# Patient Record
Sex: Male | Born: 1960
Health system: Southern US, Community
[De-identification: ages and names within clinical notes are randomized; demographics above are authoritative.]

## PROBLEM LIST (undated history)

## (undated) DIAGNOSIS — R0600 Dyspnea, unspecified: Secondary | ICD-10-CM

## (undated) DIAGNOSIS — K219 Gastro-esophageal reflux disease without esophagitis: Secondary | ICD-10-CM

## (undated) DIAGNOSIS — B1920 Unspecified viral hepatitis C without hepatic coma: Secondary | ICD-10-CM

## (undated) DIAGNOSIS — G4733 Obstructive sleep apnea (adult) (pediatric): Secondary | ICD-10-CM

## (undated) DIAGNOSIS — G5603 Carpal tunnel syndrome, bilateral upper limbs: Secondary | ICD-10-CM

## (undated) DIAGNOSIS — I5189 Other ill-defined heart diseases: Secondary | ICD-10-CM

## (undated) DIAGNOSIS — F329 Major depressive disorder, single episode, unspecified: Secondary | ICD-10-CM

## (undated) DIAGNOSIS — E039 Hypothyroidism, unspecified: Secondary | ICD-10-CM

## (undated) DIAGNOSIS — G4730 Sleep apnea, unspecified: Secondary | ICD-10-CM

## (undated) DIAGNOSIS — G40909 Epilepsy, unspecified, not intractable, without status epilepticus: Secondary | ICD-10-CM

## (undated) DIAGNOSIS — R7611 Nonspecific reaction to tuberculin skin test without active tuberculosis: Secondary | ICD-10-CM

## (undated) DIAGNOSIS — H409 Unspecified glaucoma: Secondary | ICD-10-CM

## (undated) DIAGNOSIS — M797 Fibromyalgia: Secondary | ICD-10-CM

## (undated) DIAGNOSIS — Z87442 Personal history of urinary calculi: Secondary | ICD-10-CM

## (undated) DIAGNOSIS — E7800 Pure hypercholesterolemia, unspecified: Secondary | ICD-10-CM

## (undated) DIAGNOSIS — M1990 Unspecified osteoarthritis, unspecified site: Secondary | ICD-10-CM

## (undated) DIAGNOSIS — F419 Anxiety disorder, unspecified: Secondary | ICD-10-CM

## (undated) DIAGNOSIS — K5792 Diverticulitis of intestine, part unspecified, without perforation or abscess without bleeding: Secondary | ICD-10-CM

## (undated) DIAGNOSIS — F32A Depression, unspecified: Secondary | ICD-10-CM

## (undated) HISTORY — PX: LIVER BIOPSY: SHX301

## (undated) HISTORY — DX: Depression, unspecified: F32.A

## (undated) HISTORY — DX: Epilepsy, unspecified, not intractable, without status epilepticus: G40.909

## (undated) HISTORY — DX: Unspecified glaucoma: H40.9

## (undated) HISTORY — DX: Fibromyalgia: M79.7

## (undated) HISTORY — DX: Diverticulitis of intestine, part unspecified, without perforation or abscess without bleeding: K57.92

## (undated) HISTORY — DX: Other ill-defined heart diseases: I51.89

## (undated) HISTORY — DX: Sleep apnea, unspecified: G47.30

## (undated) HISTORY — DX: Anxiety disorder, unspecified: F41.9

## (undated) HISTORY — DX: Nonspecific reaction to tuberculin skin test without active tuberculosis: R76.11

## (undated) HISTORY — DX: Unspecified osteoarthritis, unspecified site: M19.90

## (undated) HISTORY — DX: Unspecified viral hepatitis C without hepatic coma: B19.20

## (undated) HISTORY — DX: Gastro-esophageal reflux disease without esophagitis: K21.9

---
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
## 2010-11-21 DIAGNOSIS — I459 Conduction disorder, unspecified: Secondary | ICD-10-CM | POA: Insufficient documentation

## 2010-11-21 DIAGNOSIS — F410 Panic disorder [episodic paroxysmal anxiety] without agoraphobia: Secondary | ICD-10-CM | POA: Insufficient documentation

## 2010-11-21 DIAGNOSIS — Z8669 Personal history of other diseases of the nervous system and sense organs: Secondary | ICD-10-CM | POA: Insufficient documentation

## 2012-07-30 DIAGNOSIS — M7730 Calcaneal spur, unspecified foot: Secondary | ICD-10-CM | POA: Insufficient documentation

## 2012-07-30 DIAGNOSIS — G8929 Other chronic pain: Secondary | ICD-10-CM | POA: Insufficient documentation

## 2014-11-09 DIAGNOSIS — R768 Other specified abnormal immunological findings in serum: Secondary | ICD-10-CM | POA: Insufficient documentation

## 2016-04-07 DIAGNOSIS — M170 Bilateral primary osteoarthritis of knee: Secondary | ICD-10-CM | POA: Insufficient documentation

## 2016-09-23 DIAGNOSIS — E559 Vitamin D deficiency, unspecified: Secondary | ICD-10-CM | POA: Insufficient documentation

## 2018-07-15 ENCOUNTER — Ambulatory Visit: Payer: Medicaid Other | Admitting: Internal Medicine

## 2018-07-15 ENCOUNTER — Telehealth: Payer: Self-pay

## 2018-07-15 ENCOUNTER — Other Ambulatory Visit: Payer: Self-pay

## 2018-07-15 ENCOUNTER — Encounter: Payer: Self-pay | Admitting: Internal Medicine

## 2018-07-15 VITALS — BP 134/78 | HR 80 | Resp 16 | Ht 73.0 in | Wt 389.0 lb

## 2018-07-15 DIAGNOSIS — G629 Polyneuropathy, unspecified: Secondary | ICD-10-CM

## 2018-07-15 DIAGNOSIS — G4733 Obstructive sleep apnea (adult) (pediatric): Secondary | ICD-10-CM

## 2018-07-15 DIAGNOSIS — F419 Anxiety disorder, unspecified: Secondary | ICD-10-CM

## 2018-07-15 NOTE — Progress Notes (Signed)
[facility] [address] [place], [place] [id number]  Pulmonary Sleep Medicine  Office Visit Note  Patient Name: [name] [name] DOB: 03-17-1960 MRN [id number]  Date of Service: 07/15/2018  Complaints/HPI: OSA Snoring Morbid obesity. He was diagnosed with OSA in 2010 and was in [place]. He states that he has been using his CPAP device but has had some mask issues and also is due for a new CPAP device. He notes that he is tired and sleepy. He does fall asleep watching TV  He does not drive. Has issues with snoring. He states that he has been notd to coke and gag. Patient also has gained weight over the course of the last years about 100 pounds. He states he does not have headaches noted. No cough fevers no exposure to covid.   ROS  General: (-) fever, (-) chills, (-) night sweats, (-) weakness Skin: (-) rashes, (-) itching,. Eyes: (-) visual changes, (-) redness, (-) itching. Nose and Sinuses: (-) nasal stuffiness or itchiness, (-) postnasal drip, (-) nosebleeds, (-) sinus trouble. Mouth and Throat: (-) sore throat, (-) hoarseness. Neck: (-) swollen glands, (-) enlarged thyroid, (-) neck pain. Respiratory: - cough, (-) bloody sputum, + shortness of breath, - wheezing. Cardiovascular: - ankle swelling, (-) chest pain. Lymphatic: (-) lymph node enlargement. Neurologic: (-) numbness, (-) tingling. Psychiatric: (-) anxiety, (-) depression   Current Medication: No outpatient encounter medications on file as of 07/15/2018.   No facility-administered encounter medications on file as of 07/15/2018.     Surgical History: Past Surgical History:  Procedure Laterality Date  . LIVER BIOPSY      Medical History: Past Medical History:  Diagnosis Date  . Arthritis   . Depression   . Glaucoma   . Hepatitis C   . Juvenile epilepsy (HCC)   . Positive TB test   . Sleep apnea     Family History: Family History  Problem Relation Age of Onset  . Aneurysm Mother   . Cancer Father      Social History: Social History   Socioeconomic History  . Marital status: Married    Spouse name: Not on file  . Number of children: Not on file  . Years of education: Not on file  . Highest education level: Not on file  Occupational History  . Not on file  Social Needs  . Financial resource strain: Not on file  . Food insecurity:    Worry: Not on file    Inability: Not on file  . Transportation needs:    Medical: Not on file    Non-medical: Not on file  Tobacco Use  . Smoking status: Never Smoker  . Smokeless tobacco: Never Used  Substance and Sexual Activity  . Alcohol use: Not Currently  . Drug use: Not Currently  . Sexual activity: Not on file  Lifestyle  . Physical activity:    Days per week: Not on file    Minutes per session: Not on file  . Stress: Not on file  Relationships  . Social connections:    Talks on phone: Not on file    Gets together: Not on file    Attends religious service: Not on file    Active member of club or organization: Not on file    Attends meetings of clubs or organizations: Not on file    Relationship status: Not on file  . Intimate partner violence:    Fear of current or ex partner: Not on file  Emotionally abused: Not on file    Physically abused: Not on file    Forced sexual activity: Not on file  Other Topics Concern  . Not on file  Social History Narrative  . Not on file    Vital Signs: Blood pressure 134/78, pulse 80, resp. rate 16, height 6\' 1"  (1.854 m), weight (!) 389 lb (176.4 kg), SpO2 97 %.  Examination: General Appearance: The patient is well-developed, well-nourished, and in no distress. Skin: Gross inspection of skin unremarkable. Head: normocephalic, no gross deformities. Eyes: no gross deformities noted. ENT: ears appear grossly normal no exudates. Neck: Supple. No thyromegaly. No LAD. Respiratory: No rhonchi noted at this time. Cardiovascular: Normal S1 and S2 without murmur or rub. Extremities: No  cyanosis. pulses are equal. Neurologic: Alert and oriented. No involuntary movements.  LABS: No results found for this or any previous visit (from the past 2160 hour(s)).  Radiology: Patient was never admitted.  No results found.  No results found.    Assessment and Plan: There are no active problems to display for this patient.   1. OSA needs a PSG will get this scheduled. Patient has not had a evaluation since 2010 at least. Also has issues with his CPAP device and so therfore will need to get the CPAP. 2. Morbid obesity discussed diet and weight loss 3. Neuropathy patient is on gabapentin.  4. Anxiety will need to be seen by his therapist   General Counseling: I have discussed the findings of the evaluation and examination with [name].  I have also discussed any further diagnostic evaluation thatmay be needed or ordered today. Azazel verbalizes understanding of the findings of todays visit. We also reviewed his medications today and discussed drug interactions and side effects including but not limited excessive drowsiness and altered mental states. We also discussed that there is always a risk not just to him but also people around him. he has been encouraged to call the office with any questions or concerns that should arise related to todays visit.    Time spent:  I have personally obtained a history, examined the patient, evaluated laboratory and imaging results, formulated the assessment and plan and placed orders.    Yevonne Pax, MD [facility] Pulmonary and Critical Care Sleep medicine

## 2018-07-15 NOTE — Patient Instructions (Signed)
Sleep Study    The sleep study consists of a recording of your brain waves (EEG). Breathing, heart rate and rhythm (ECG), oxygen level, eye movement, and leg movement.  The technician will glue or or paste several electrodes to your scalp, face, chest and legs.  You will have belts around your chest and abdomen to record breathing and a finger clasp to check blood oxygen levels.  A tube at your mouth and nose will detect airflow.  There are no needle sticks or painful procedures of any sort.  You will have your own room, and we will make every effort to attend to your comfort and privacy.  Please prepare for your study by the following steps:   Please avoid coffee, tea, soda, chocolate and other caffeine foods or beverages after 12:00 noon on the day of your sleep study.   You must arrive with clean (no oils), conditioners or make up, and please make sure that you wash your hair to ensure that your hair and scalp are clean, dry and free of any hair extensions on the day of your study.  This will help to get a good reading of study.  Please try not to nap on the day of your study.  Please bring a list of all your medications.  Bring any medications that you might need during the time you are within the laboratory, including insulin, sleeping pills, pain medication and anxiety medications.  Bring snacks, water or juice  Please bring clothes to sleep in and your normal overnight bag.  Please leave valuable at home, as we will not be responsible for any lost items.  If you have any further questions, please feel free to call our office. Thank you  **Please call our office 48 in advance to cancel or reschedule to avoid a $100.00 early cancel or no show fee**  

## 2018-07-15 NOTE — Telephone Encounter (Signed)
[facility] for new cpap supplies, he is scheduled for sleep study in lab. Beth

## 2018-07-19 ENCOUNTER — Encounter: Payer: Self-pay | Admitting: Adult Health

## 2018-07-27 ENCOUNTER — Ambulatory Visit: Payer: Medicaid Other | Admitting: Internal Medicine

## 2018-07-27 ENCOUNTER — Other Ambulatory Visit: Payer: Self-pay

## 2018-07-27 DIAGNOSIS — G4733 Obstructive sleep apnea (adult) (pediatric): Secondary | ICD-10-CM

## 2018-07-29 ENCOUNTER — Ambulatory Visit: Payer: Medicaid Other | Admitting: Adult Health

## 2018-07-29 ENCOUNTER — Encounter: Payer: Self-pay | Admitting: Adult Health

## 2018-07-29 ENCOUNTER — Other Ambulatory Visit: Payer: Self-pay

## 2018-07-29 VITALS — BP 144/82 | HR 85 | Resp 16 | Ht 73.0 in | Wt 389.0 lb

## 2018-07-29 DIAGNOSIS — F339 Major depressive disorder, recurrent, unspecified: Secondary | ICD-10-CM

## 2018-07-29 DIAGNOSIS — F4310 Post-traumatic stress disorder, unspecified: Secondary | ICD-10-CM

## 2018-07-29 DIAGNOSIS — F419 Anxiety disorder, unspecified: Secondary | ICD-10-CM

## 2018-07-29 DIAGNOSIS — K219 Gastro-esophageal reflux disease without esophagitis: Secondary | ICD-10-CM | POA: Diagnosis not present

## 2018-07-29 DIAGNOSIS — R030 Elevated blood-pressure reading, without diagnosis of hypertension: Secondary | ICD-10-CM

## 2018-07-29 MED ORDER — OMEPRAZOLE 20 MG PO CPDR: 20.0000 mg | DELAYED_RELEASE_CAPSULE | Freq: Every day | ORAL | 2 refills | Status: DC

## 2018-07-29 NOTE — Progress Notes (Signed)
[facility] [place], [place] [address]  Internal MEDICINE  Office Visit Note  Patient Name: [name] [name]  [id number]  [id number]  Date of Service: 07/29/2018   Complaints/HPI Pt is here for establishment of PCP. Chief Complaint  Patient presents with  . Medical Management of Chronic Issues    Establish pcp care , possible adhd, moved from [place]  . Anxiety  . Depression  . Fibromyalgia  . Gastroesophageal Reflux  . Quality Metric Gaps    colonoscopy    HPI Pt is here to establish PCP care.  He reports he moved down from [place] 3 months ago. He reportedly had been seeing psych weekly for med management. He has been out of his medications since late march.  He reports a history depression, anxiety, ptsd, and GERD. At this time he is doing well, and would like a refill on his prilosec for gerd, as well as a psych referral to establish care locally.       Current Medication: Outpatient Encounter Medications as of 07/29/2018  Medication Sig  . ALPRAZolam (XANAX) 1 MG tablet Take 1 mg by mouth 2 (two) times daily as needed for anxiety.  . busPIRone (BUSPAR) 15 MG tablet Take 15 mg by mouth 3 (three) times daily.  Marland Kitchen gabapentin (NEURONTIN) 600 MG tablet Take 600 mg by mouth 3 (three) times daily.  Marland Kitchen omeprazole (PRILOSEC) 20 MG capsule Take 1 capsule (20 mg total) by mouth daily.  Marland Kitchen PARoxetine (PAXIL) 40 MG tablet Take 40 mg by mouth every morning.  . traZODone (DESYREL) 100 MG tablet Take 100 mg by mouth at bedtime.  . [DISCONTINUED] cyclobenzaprine (FLEXERIL) 10 MG tablet Take 10 mg by mouth 3 (three) times daily as needed for muscle spasms.  . [DISCONTINUED] omeprazole (PRILOSEC) 20 MG capsule Take 20 mg by mouth daily.   No facility-administered encounter medications on file as of 07/29/2018.     Surgical History: Past Surgical History:  Procedure Laterality Date  . LIVER BIOPSY      Medical History: Past Medical History:  Diagnosis Date  . Anxiety    . Arthritis   . Depression   . Fibromyalgia   . GERD (gastroesophageal reflux disease)   . Glaucoma   . Hepatitis C   . Juvenile epilepsy ([place])   . Positive TB test   . Sleep apnea     Family History: Family History  Problem Relation Age of Onset  . Aneurysm Mother   . Cancer Father     Social History   Socioeconomic History  . Marital status: Married    Spouse name: Not on file  . Number of children: Not on file  . Years of education: Not on file  . Highest education level: Not on file  Occupational History  . Not on file  Social Needs  . Financial resource strain: Not on file  . Food insecurity    Worry: Not on file    Inability: Not on file  . Transportation needs    Medical: Not on file    Non-medical: Not on file  Tobacco Use  . Smoking status: Never Smoker  . Smokeless tobacco: Never Used  Substance and Sexual Activity  . Alcohol use: Not Currently  . Drug use: Not Currently  . Sexual activity: Not on file  Lifestyle  . Physical activity    Days per week: Not on file    Minutes per session: Not on file  . Stress: Not on file  Relationships  . Social Musicianconnections    Talks on phone: Not on file    Gets together: Not on file    Attends religious service: Not on file    Active member of club or organization: Not on file    Attends meetings of clubs or organizations: Not on file    Relationship status: Not on file  . Intimate partner violence    Fear of current or ex partner: Not on file    Emotionally abused: Not on file    Physically abused: Not on file    Forced sexual activity: Not on file  Other Topics Concern  . Not on file  Social History Narrative  . Not on file     Review of Systems  Constitutional: Negative.  Negative for chills, fatigue and unexpected weight change.  HENT: Negative.  Negative for congestion, rhinorrhea, sneezing and sore throat.   Eyes: Negative for redness.  Respiratory: Negative.  Negative for cough, chest  tightness and shortness of breath.   Cardiovascular: Negative.  Negative for chest pain and palpitations.  Gastrointestinal: Negative.  Negative for abdominal pain, constipation, diarrhea, nausea and vomiting.  Endocrine: Negative.   Genitourinary: Negative.  Negative for dysuria and frequency.  Musculoskeletal: Negative.  Negative for arthralgias, back pain, joint swelling and neck pain.  Skin: Negative.  Negative for rash.  Allergic/Immunologic: Negative.   Neurological: Negative.  Negative for tremors and numbness.  Hematological: Negative for adenopathy. Does not bruise/bleed easily.  Psychiatric/Behavioral: Negative.  Negative for behavioral problems, sleep disturbance and suicidal ideas. The patient is not nervous/anxious.     Vital Signs: BP (!) 144/82 (BP Location: Left Arm, Patient Position: Sitting, Cuff Size: Normal)   Pulse 85   Resp 16   Ht 6\' 1"  (1.854 m)   Wt (!) 389 lb (176.4 kg)   SpO2 97%   BMI 51.32 kg/m    Physical Exam Vitals signs and nursing note reviewed.  Constitutional:      General: He is not in acute distress.    Appearance: He is well-developed. He is not diaphoretic.  HENT:     Head: Normocephalic and atraumatic.     Mouth/Throat:     Pharynx: No oropharyngeal exudate.  Eyes:     Pupils: Pupils are equal, round, and reactive to light.  Neck:     Musculoskeletal: Normal range of motion and neck supple.     Thyroid: No thyromegaly.     Vascular: No JVD.     Trachea: No tracheal deviation.  Cardiovascular:     Rate and Rhythm: Normal rate and regular rhythm.     Heart sounds: Normal heart sounds. No murmur. No friction rub. No gallop.   Pulmonary:     Effort: Pulmonary effort is normal. No respiratory distress.     Breath sounds: Normal breath sounds. No wheezing or rales.  Chest:     Chest wall: No tenderness.  Abdominal:     Palpations: Abdomen is soft.     Tenderness: There is no abdominal tenderness. There is no guarding.   Musculoskeletal: Normal range of motion.  Lymphadenopathy:     Cervical: No cervical adenopathy.  Skin:    General: Skin is warm and dry.  Neurological:     Mental Status: He is alert and oriented to person, place, and time.     Cranial Nerves: No cranial nerve deficit.  Psychiatric:        Behavior: Behavior normal.  Thought Content: Thought content normal.        Judgment: Judgment normal.     Assessment/Plan: 1. PTSD (post-traumatic stress disorder) See psychiatry when appt is established.  - Ambulatory referral to Psychiatry  2. Anxiety Stable at this time.. See psychiatry as discussed for med management.  - Ambulatory referral to Psychiatry  3. Depression, recurrent (HCC) Currently stable, see psychiatry - Ambulatory referral to Psychiatry  4. Gastroesophageal reflux disease without esophagitis Refilled patients Prilosec NAD noted at this time.  - omeprazole (PRILOSEC) 20 MG capsule; Take 1 capsule (20 mg total) by mouth daily.  Dispense: 30 capsule; Refill: 2  5. Elevated BP without diagnosis of hypertension Bp slightly elevated, rechecked and found to be142/72 Will continue to monitor at future visits.   General Counseling: Lidia CollumAllen verbalizes understanding of the findings of todays visit and agrees with plan of treatment. I have discussed any further diagnostic evaluation that may be needed or ordered today. We also reviewed his medications today. he has been encouraged to call the office with any questions or concerns that should arise related to todays visit.  Orders Placed This Encounter  Procedures  . Ambulatory referral to Psychiatry    Meds ordered this encounter  Medications  . omeprazole (PRILOSEC) 20 MG capsule    Sig: Take 1 capsule (20 mg total) by mouth daily.    Dispense:  30 capsule    Refill:  2    Time spent 35 Minutes   This patient was seen by Blima LedgerAdam Couper Juncaj AGNP-C in Collaboration with Dr Lyndon CodeFozia M Khan as a part of collaborative care  agreement  Johnna AcostaAdam J. Dragan Tamburrino AGNP-C Internal Medicine

## 2018-08-04 ENCOUNTER — Other Ambulatory Visit: Payer: Self-pay | Admitting: Adult Health

## 2018-08-05 LAB — CBC WITH DIFFERENTIAL/PLATELET
Basophils Absolute: 0 x10E3/uL (ref 0.0–0.2)
Basos: 1 %
EOS (ABSOLUTE): 0.1 x10E3/uL (ref 0.0–0.4)
Eos: 1 %
Hematocrit: 40.6 % (ref 37.5–51.0)
Hemoglobin: 13.6 g/dL (ref 13.0–17.7)
Immature Grans (Abs): 0 x10E3/uL (ref 0.0–0.1)
Immature Granulocytes: 1 %
Lymphocytes Absolute: 1.9 x10E3/uL (ref 0.7–3.1)
Lymphs: 22 %
MCH: 29.2 pg (ref 26.6–33.0)
MCHC: 33.5 g/dL (ref 31.5–35.7)
MCV: 87 fL (ref 79–97)
Monocytes Absolute: 0.7 x10E3/uL (ref 0.1–0.9)
Monocytes: 8 %
Neutrophils Absolute: 5.8 x10E3/uL (ref 1.4–7.0)
Neutrophils: 67 %
Platelets: 209 x10E3/uL (ref 150–450)
RBC: 4.66 x10E6/uL (ref 4.14–5.80)
RDW: 12.8 % (ref 11.6–15.4)
WBC: 8.6 x10E3/uL (ref 3.4–10.8)

## 2018-08-05 LAB — T4, FREE: Free T4: 0.71 ng/dL — ABNORMAL LOW (ref 0.82–1.77)

## 2018-08-05 LAB — HGB A1C W/O EAG: Hgb A1c MFr Bld: 5.7 % — ABNORMAL HIGH (ref 4.8–5.6)

## 2018-08-05 LAB — COMPREHENSIVE METABOLIC PANEL WITH GFR
ALT: 26 IU/L (ref 0–44)
AST: 22 IU/L (ref 0–40)
Albumin/Globulin Ratio: 1.3 (ref 1.2–2.2)
Albumin: 4.3 g/dL (ref 3.8–4.9)
Alkaline Phosphatase: 72 IU/L (ref 39–117)
BUN/Creatinine Ratio: 23 — ABNORMAL HIGH (ref 9–20)
BUN: 18 mg/dL (ref 6–24)
Bilirubin Total: 0.4 mg/dL (ref 0.0–1.2)
CO2: 26 mmol/L (ref 20–29)
Calcium: 9.6 mg/dL (ref 8.7–10.2)
Chloride: 102 mmol/L (ref 96–106)
Creatinine, Ser: 0.8 mg/dL (ref 0.76–1.27)
GFR calc Af Amer: 116 mL/min/1.73
GFR calc non Af Amer: 101 mL/min/1.73
Globulin, Total: 3.2 g/dL (ref 1.5–4.5)
Glucose: 88 mg/dL (ref 65–99)
Potassium: 4.6 mmol/L (ref 3.5–5.2)
Sodium: 140 mmol/L (ref 134–144)
Total Protein: 7.5 g/dL (ref 6.0–8.5)

## 2018-08-05 LAB — LIPID PANEL WITH LDL/HDL RATIO
Cholesterol, Total: 168 mg/dL (ref 100–199)
HDL: 40 mg/dL
LDL Calculated: 101 mg/dL — ABNORMAL HIGH (ref 0–99)
LDl/HDL Ratio: 2.5 ratio (ref 0.0–3.6)
Triglycerides: 137 mg/dL (ref 0–149)
VLDL Cholesterol Cal: 27 mg/dL (ref 5–40)

## 2018-08-05 LAB — TSH: TSH: 1.15 u[IU]/mL (ref 0.450–4.500)

## 2018-08-05 LAB — PSA: Prostate Specific Ag, Serum: 0.6 ng/mL (ref 0.0–4.0)

## 2018-08-09 ENCOUNTER — Other Ambulatory Visit: Payer: Self-pay

## 2018-08-09 ENCOUNTER — Ambulatory Visit: Payer: Medicaid Other | Admitting: Internal Medicine

## 2018-08-09 ENCOUNTER — Encounter: Payer: Self-pay | Admitting: Internal Medicine

## 2018-08-09 VITALS — BP 144/84 | HR 82 | Resp 16 | Ht 73.0 in | Wt 385.0 lb

## 2018-08-09 DIAGNOSIS — F4310 Post-traumatic stress disorder, unspecified: Secondary | ICD-10-CM | POA: Diagnosis not present

## 2018-08-09 DIAGNOSIS — G4733 Obstructive sleep apnea (adult) (pediatric): Secondary | ICD-10-CM | POA: Diagnosis not present

## 2018-08-09 DIAGNOSIS — F419 Anxiety disorder, unspecified: Secondary | ICD-10-CM

## 2018-08-09 NOTE — Patient Instructions (Signed)

## 2018-08-09 NOTE — Progress Notes (Signed)
[facility] [place], [place] [address]  Pulmonary Sleep Medicine   Office Visit Note  Patient Name: [name] [name] DOB: 1December 23, 1962 MRN [id number]  Date of Service: 08/09/2018  Complaints/HPI: Pt is here for follow up on sleep study.  His study showed an overall AHI 22.9/hr. Which is consistent with moderate sleep apnea. He had a low oxygen recording of 78%, showing a significant desaturation.  His HR noted no arrhythmias, and his HR was between 65 and 90 bpm.      ROS  General: (-) fever, (-) chills, (-) night sweats, (-) weakness Skin: (-) rashes, (-) itching,. Eyes: (-) visual changes, (-) redness, (-) itching. Nose and Sinuses: (-) nasal stuffiness or itchiness, (-) postnasal drip, (-) nosebleeds, (-) sinus trouble. Mouth and Throat: (-) sore throat, (-) hoarseness. Neck: (-) swollen glands, (-) enlarged thyroid, (-) neck pain. Respiratory: - cough, (-) bloody sputum, - shortness of breath, - wheezing. Cardiovascular: - ankle swelling, (-) chest pain. Lymphatic: (-) lymph node enlargement. Neurologic: (-) numbness, (-) tingling. Psychiatric: (-) anxiety, (-) depression   Current Medication: Outpatient Encounter Medications as of 08/09/2018  Medication Sig  . ALPRAZolam (XANAX) 1 MG tablet Take 1 mg by mouth 2 (two) times daily as needed for anxiety.  . busPIRone (BUSPAR) 15 MG tablet Take 15 mg by mouth 3 (three) times daily.  Marland Kitchen gabapentin (NEURONTIN) 600 MG tablet Take 600 mg by mouth 3 (three) times daily.  . NON FORMULARY cpap device  . omeprazole (PRILOSEC) 20 MG capsule Take 1 capsule (20 mg total) by mouth daily.  Marland Kitchen PARoxetine (PAXIL) 40 MG tablet Take 40 mg by mouth every morning.  . traZODone (DESYREL) 100 MG tablet Take 100 mg by mouth at bedtime.   No facility-administered encounter medications on file as of 08/09/2018.     Surgical History: Past Surgical History:  Procedure Laterality Date  . LIVER BIOPSY      Medical History: Past  Medical History:  Diagnosis Date  . Anxiety   . Arthritis   . Depression   . Fibromyalgia   . GERD (gastroesophageal reflux disease)   . Glaucoma   . Hepatitis C   . Juvenile epilepsy ([place])   . Positive TB test   . Sleep apnea     Family History: Family History  Problem Relation Age of Onset  . Aneurysm Mother   . Cancer Father     Social History: Social History   Socioeconomic History  . Marital status: Married    Spouse name: Not on file  . Number of children: Not on file  . Years of education: Not on file  . Highest education level: Not on file  Occupational History  . Not on file  Social Needs  . Financial resource strain: Not on file  . Food insecurity    Worry: Not on file    Inability: Not on file  . Transportation needs    Medical: Not on file    Non-medical: Not on file  Tobacco Use  . Smoking status: Never Smoker  . Smokeless tobacco: Never Used  Substance and Sexual Activity  . Alcohol use: Not Currently  . Drug use: Not Currently  . Sexual activity: Not on file  Lifestyle  . Physical activity    Days per week: Not on file    Minutes per session: Not on file  . Stress: Not on file  Relationships  . Social connections    Talks on phone: Not on file  Gets together: Not on file    Attends religious service: Not on file    Active member of club or organization: Not on file    Attends meetings of clubs or organizations: Not on file    Relationship status: Not on file  . Intimate partner violence    Fear of current or ex partner: Not on file    Emotionally abused: Not on file    Physically abused: Not on file    Forced sexual activity: Not on file  Other Topics Concern  . Not on file  Social History Narrative  . Not on file    Vital Signs: Blood pressure (!) 144/84, pulse 82, resp. rate 16, height 6\' 1"  (1.854 m), weight (!) 385 lb (174.6 kg), SpO2 94 %.  Examination: General Appearance: The patient is well-developed, well-nourished,  and in no distress. Skin: Gross inspection of skin unremarkable. Head: normocephalic, no gross deformities. Eyes: no gross deformities noted. ENT: ears appear grossly normal no exudates. Neck: Supple. No thyromegaly. No LAD. Respiratory: clear bilaterally. Cardiovascular: Normal S1 and S2 without murmur or rub. Extremities: No cyanosis. pulses are equal. Neurologic: Alert and oriented. No involuntary movements.  LABS: No results found for this or any previous visit (from the past 2160 hour(s)).  Radiology: Patient was never admitted.  No results found.  No results found.    Assessment and Plan: There are no active problems to display for this patient.   1. OSA (obstructive sleep apnea) Will get titration study to evaluate for appropriate pressure to control his osa.  - Cpap titration; Future  2. PTSD (post-traumatic stress disorder) Stable, continue present management.   3. Anxiety Stable, continue present management.   General Counseling: I have discussed the findings of the evaluation and examination with Freida BusmanAllen.  I have also discussed any further diagnostic evaluation thatmay be needed or ordered today. Ladamien verbalizes understanding of the findings of todays visit. We also reviewed his medications today and discussed drug interactions and side effects including but not limited excessive drowsiness and altered mental states. We also discussed that there is always a risk not just to him but also people around him. he has been encouraged to call the office with any questions or concerns that should arise related to todays visit.    Time spent: 20 This patient was seen by Blima LedgerAdam Rogelio Winbush AGNP-C in Collaboration with Dr. Freda MunroSaadat Khan as a part of collaborative care agreement.   I have personally obtained a history, examined the patient, evaluated laboratory and imaging results, formulated the assessment and plan and placed orders.    Yevonne PaxSaadat A Khan, MD [facility] Pulmonary and  Critical Care Sleep medicine

## 2018-08-15 NOTE — Procedures (Signed)
[facility] [address] [place],  [address]  Patient Name: [name] [name] DOB: [birth year]   SLEEP STUDY INTERPRETATION  DATE OF SERVICE: July 27, 2018   SLEEP STUDY HISTORY: This patient is referred to the sleep lab for a baseline Polysomnography. Pertinent history includes a history of diagnosis of excessive daytime somnolence and snoring.  PROCEDURE: This overnight polysomnogram was performed using the Alice 5 acquisition system using the standard diagnostic protocol as outlined by the AASM. This includes 6 channels of EEG, 2 channelscannels of EOG, chin EMG, bilateral anterior tibialis EMG, nasal/oral thermister, PTAF, chest and abdominal wall movements, ECG and pulse oximetry. Apneas and Hypopneas were scored per AASM definition.  SLEEP ARCHITECHTURE: This is a baseline polysomnograph  study. The total recording time was 396.8 minutes and the patients total sleep time is noted to be 209.8 minutes. Sleep onset latency was 54.0 minutes and is prolonged.  Stage R sleep onset latency was 253.0 minutes. Sleep maintenance efficiency was 52.9 % and is reduced.  Sleep staging expressed as a percentage of total sleep time demonstrated 27.4 % N1, 38.5 % N2 and 18.6 % N3  sleep. Stage R represents 15.5 % of total sleep time. This is decreased.  There were a total of 182 arousals  for an overall arousal index of 52.0 per hour of sleep. PLMS arousal are not noted. Arousals without respiratory events are  noted. This can contribute to sleep architechture disruption.  RESPIRATORY MONITORING:   Patient exhibits significant evidence of sleep disorderd breathing characterized by 12 central apneas, 42 obstructive apneas and 4 mixed apneas. There were 22 obstructive hypopneas and 0 RERAs. Most of the apneas/hypopneas were of obstructive variety. The total apnea hypopnea index (apneas and hypopneas per hour of sleep) is 22.9 respiratory events per hour and is moderate.  Respiratory  monitoring demonstrated mild snoring through the night. There are a total of 141 snoring episodes representing 7.2 % of sleep.   Baseline oxygen saturation during wakefulness was 94 % and during NREM sleep averaged 92 % through the night. Arterial saturation during REM sleep was 91 % through the night. There was significant  oxygen desaturation with the respiratory events. Arterial oxygen desaturation occurred of at least 4% was noted with a low saturation of 78 %. The study was performed off oxygen.  CARDIAC MONITORING:   Average heart rate is 65 during sleep with a high of 90 beats per minute. Malignant arrhythmias were not noted.    IMPRESSIONS:  --This overnight polysomnogram demonstrates presence of moderate obstructive sleep apnea with an overall AHI 22.9 per hour. --The overall AHI was significantly worse  during Stage R. --There were associated significant arterial oxygen desaturations noted down to 74% --There was no significant PLMS noted in this study. --There is mild snoring noted throughout the study.    RECOMMENDATIONS:  --CPAP titration study is indicated in this case to determine optimal response to therapy. --Nasal decongestants and antihistamines may be of help for increased upper airways resistance when present. --Weight loss through dietary and lifestyle modification is recommended in the presence of obesity. --A search for and treatment of any underlying cardiopulmonary disease is      recommended in the presence of oxygen desaturations. --Alternative treatment options if the patient is not willing to use CPAP include oral   appliances as well as surgical intervention which may help in the appropriate patient. --Clinical correlation is recommended. Please feel free to call the office for any further  questions or  assistance in the care of this patient.     Allyne Gee, MD [facility] Pulmonary Critical Care Medicine Sleep medicine

## 2018-08-18 ENCOUNTER — Encounter (INDEPENDENT_AMBULATORY_CARE_PROVIDER_SITE_OTHER): Payer: Medicaid Other | Admitting: Internal Medicine

## 2018-08-18 DIAGNOSIS — G4733 Obstructive sleep apnea (adult) (pediatric): Secondary | ICD-10-CM | POA: Diagnosis not present

## 2018-08-23 ENCOUNTER — Other Ambulatory Visit: Payer: Self-pay

## 2018-08-31 ENCOUNTER — Other Ambulatory Visit: Payer: Self-pay

## 2018-08-31 ENCOUNTER — Encounter: Payer: Medicaid Other | Admitting: Adult Health

## 2018-09-05 NOTE — Procedures (Signed)
[facility] [address] [place], [place] [address]  Patient Name: [name] [name] DOB: 1Apr 23, 1962   SLEEP STUDY INTERPRETATION  DATE OF SERVICE: August 18, 2018   SLEEP STUDY HISTORY: This patient is referred to the sleep lab for a baseline Polysomnography. Pertinent history includes a history of diagnosis of excessive daytime somnolence and snoring.  PROCEDURE: This overnight polysomnogram was performed using the Alice 5 acquisition system using the standard diagnostic protocol as outlined by the AASM. This includes 6 channels of EEG, 2 channelscannels of EOG, chin EMG, bilateral anterior tibialis EMG, nasal/oral thermister, PTAF, chest and abdominal wall movements, ECG and pulse oximetry. Apneas and Hypopneas were scored per AASM definition.  SLEEP ARCHITECHTURE: This is a baseline polysomnograph  study. The total recording time was 358 minutes and the patients total sleep time is noted to be 179.5 minutes. Sleep onset latency was 40.0 minutes and is prolonged.  Stage R sleep onset latency was 145.0 minutes. Sleep maintenance efficiency was 50.5 % and is decreased.  Sleep staging expressed as a percentage of total sleep time demonstrated 17.5 % N1, 35.4 % N2 and 27.9 % N3  sleep. Stage R represents 19.2 % of total sleep time. This is decreased.  There were a total of 39 arousals  for an overall arousal index of 13.0 per hour of sleep. PLMS arousal were not noted. Arousals without respiratory events are  noted. This can contribute to sleep architechture disruption.  RESPIRATORY MONITORING:   Patient exhibits some evidence of sleep disorderd breathing characterized by 1 central apneas, 0 obstructive apneas and 1 mixed apneas. There were 42 obstructive hypopneas and 0 RERAs. Most of the apneas/hypopneas were of obstructive variety. The total apnea hypopnea index (apneas and hypopneas per hour of sleep) is 14.7 respiratory events per hour and is mild.  Respiratory monitoring  demonstrated mild snoring through the night. There are a total of 44 snoring episodes representing 2.5 % of sleep.   Baseline oxygen saturation during wakefulness was 94 % and during NREM sleep averaged 92 % through the night. Arterial saturation during REM sleep was 92 % through the night. There was significant  oxygen desaturation with the respiratory events. Arterial oxygen desaturation occurred of at least 4% was noted with a low saturation of 84 %. The study was performed off oxygen.  CARDIAC MONITORING:   Average heart rate is 74 during sleep with a high of 94 beats per minute. Malignant arrhythmias were not noted.  CPAP titration: Patient was started on CPAP titration per sleep lab protocol.  Patient was started on a pressure of 6 cm water pressure and increased up to a pressure of 10 cm water pressure.  On a pressure of 10 cm water pressure for a total sleep time of 83.5 minutes stage R was noted.  Patient did exhibit an AHI of 7.2 and lowest saturations were 89%.  No further titration was done beyond this level and could have been continued to determine the optimal pressure.  IMPRESSIONS:  --This overnight polysomnogram demonstrates presence of mild sleep apnea with an overall AHI 14.7 per hour. --The overall AHI was no worse  during Stage R. --There were associated some arterial oxygen desaturations noted 84% --There was no significant PLMS noted in this study. --There is was mild snoring noted throughout the study.    RECOMMENDATIONS:  --CPAP titration study is incomplete in this case as the patient was titrated up to a pressure of 10 however still had some residual respiratory disturbances noted.  It would be beneficial to do an auto titration on this patient and this will help to determine the optimal pressure alternatively patient may be brought back to the sleep lab for a full titration beyond a pressure of 10 cm water pressure.. --Nasal decongestants and antihistamines may be  of help for increased upper airways resistance when present. --Weight loss through dietary and lifestyle modification is recommended in the presence of obesity. --A search for and treatment of any underlying cardiopulmonary disease is      recommended in the presence of oxygen desaturations. --Alternative treatment options if the patient is not willing to use CPAP include oral   appliances as well as surgical intervention which may help in the appropriate patient. --Clinical correlation is recommended. Please feel free to call the office for any further  questions or assistance in the care of this patient.     Yevonne PaxSaadat A , MD [facility] Pulmonary Critical Care Medicine Sleep medicine

## 2018-09-07 ENCOUNTER — Encounter: Payer: Self-pay | Admitting: Internal Medicine

## 2018-09-07 ENCOUNTER — Ambulatory Visit: Payer: Medicaid Other | Admitting: Internal Medicine

## 2018-09-07 ENCOUNTER — Other Ambulatory Visit: Payer: Self-pay

## 2018-09-07 VITALS — BP 134/70 | HR 80 | Resp 16 | Ht 73.0 in | Wt 392.0 lb

## 2018-09-07 DIAGNOSIS — G4733 Obstructive sleep apnea (adult) (pediatric): Secondary | ICD-10-CM

## 2018-09-07 DIAGNOSIS — E034 Atrophy of thyroid (acquired): Secondary | ICD-10-CM | POA: Diagnosis not present

## 2018-09-07 MED ORDER — LEVOTHYROXINE SODIUM 50 MCG PO TABS: 50.0000 ug | ORAL_TABLET | Freq: Every day | ORAL | 2 refills | Status: DC

## 2018-09-07 NOTE — Progress Notes (Signed)
[facility] [address] [place], [place] [id number]  Pulmonary Sleep Medicine   Office Visit Note  Patient Name: [name] [name] DOB: 10/25/1960 MRN [id number]  Date of Service: 09/07/2018  Complaints/HPI: Pt is here for follow up on titration study.  His titration study shows a  pressure of 10 was not exactly sufficient. We discussed coming in for another titration study to check optimal pressure or do an autotitation machine.  He felt that was best at this time.   ROS  General: (-) fever, (-) chills, (-) night sweats, (-) weakness Skin: (-) rashes, (-) itching,. Eyes: (-) visual changes, (-) redness, (-) itching. Nose and Sinuses: (-) nasal stuffiness or itchiness, (-) postnasal drip, (-) nosebleeds, (-) sinus trouble. Mouth and Throat: (-) sore throat, (-) hoarseness. Neck: (-) swollen glands, (-) enlarged thyroid, (-) neck pain. Respiratory: - cough, (-) bloody sputum, - shortness of breath, - wheezing. Cardiovascular: - ankle swelling, (-) chest pain. Lymphatic: (-) lymph node enlargement. Neurologic: (-) numbness, (-) tingling. Psychiatric: (-) anxiety, (-) depression   Current Medication: Outpatient Encounter Medications as of 09/07/2018  Medication Sig  . ALPRAZolam (XANAX) 1 MG tablet Take 1 mg by mouth 2 (two) times daily as needed for anxiety.  . busPIRone (BUSPAR) 15 MG tablet Take 15 mg by mouth 3 (three) times daily.  Marland Kitchen. gabapentin (NEURONTIN) 600 MG tablet Take 600 mg by mouth 3 (three) times daily.  . NON FORMULARY cpap device  . omeprazole (PRILOSEC) 20 MG capsule Take 1 capsule (20 mg total) by mouth daily.  Marland Kitchen. PARoxetine (PAXIL) 40 MG tablet Take 40 mg by mouth every morning.  . traZODone (DESYREL) 100 MG tablet Take 100 mg by mouth at bedtime.   No facility-administered encounter medications on file as of 09/07/2018.     Surgical History: Past Surgical History:  Procedure Laterality Date  . LIVER BIOPSY      Medical History: Past Medical  History:  Diagnosis Date  . Anxiety   . Arthritis   . Depression   . Fibromyalgia   . GERD (gastroesophageal reflux disease)   . Glaucoma   . Hepatitis C   . Juvenile epilepsy (HCC)   . Positive TB test   . Sleep apnea     Family History: Family History  Problem Relation Age of Onset  . Aneurysm Mother   . Cancer Father     Social History: Social History   Socioeconomic History  . Marital status: Married    Spouse name: Not on file  . Number of children: Not on file  . Years of education: Not on file  . Highest education level: Not on file  Occupational History  . Not on file  Social Needs  . Financial resource strain: Not on file  . Food insecurity    Worry: Not on file    Inability: Not on file  . Transportation needs    Medical: Not on file    Non-medical: Not on file  Tobacco Use  . Smoking status: Never Smoker  . Smokeless tobacco: Never Used  Substance and Sexual Activity  . Alcohol use: Not Currently  . Drug use: Not Currently  . Sexual activity: Not on file  Lifestyle  . Physical activity    Days per week: Not on file    Minutes per session: Not on file  . Stress: Not on file  Relationships  . Social Musicianconnections    Talks on phone: Not on file    Gets together:  Not on file    Attends religious service: Not on file    Active member of club or organization: Not on file    Attends meetings of clubs or organizations: Not on file    Relationship status: Not on file  . Intimate partner violence    Fear of current or ex partner: Not on file    Emotionally abused: Not on file    Physically abused: Not on file    Forced sexual activity: Not on file  Other Topics Concern  . Not on file  Social History Narrative  . Not on file    Vital Signs: Blood pressure 134/70, pulse 80, resp. rate 16, height 6\' 1"  (1.854 m), weight (!) 392 lb (177.8 kg), SpO2 98 %.  Examination: General Appearance: The patient is well-developed, well-nourished, and in no  distress. Skin: Gross inspection of skin unremarkable. Head: normocephalic, no gross deformities. Eyes: no gross deformities noted. ENT: ears appear grossly normal no exudates. Neck: Supple. No thyromegaly. No LAD. Respiratory: clear bilateraly. Cardiovascular: Normal S1 and S2 without murmur or rub. Extremities: No cyanosis. pulses are equal. Neurologic: Alert and oriented. No involuntary movements.  LABS: Recent Results (from the past 2160 hour(s))  CBC with Differential/Platelet     Status: None   Collection Time: 08/04/18  1:51 PM  Result Value Ref Range   WBC 8.6 3.4 - 10.8 x10E3/uL   RBC 4.66 4.14 - 5.80 x10E6/uL   Hemoglobin 13.6 13.0 - 17.7 g/dL   Hematocrit 40.6 37.5 - 51.0 %   MCV 87 79 - 97 fL   MCH 29.2 26.6 - 33.0 pg   MCHC 33.5 31.5 - 35.7 g/dL   RDW 12.8 11.6 - 15.4 %   Platelets 209 150 - 450 x10E3/uL   Neutrophils 67 Not Estab. %   Lymphs 22 Not Estab. %   Monocytes 8 Not Estab. %   Eos 1 Not Estab. %   Basos 1 Not Estab. %   Neutrophils Absolute 5.8 1.4 - 7.0 x10E3/uL   Lymphocytes Absolute 1.9 0.7 - 3.1 x10E3/uL   Monocytes Absolute 0.7 0.1 - 0.9 x10E3/uL   EOS (ABSOLUTE) 0.1 0.0 - 0.4 x10E3/uL   Basophils Absolute 0.0 0.0 - 0.2 x10E3/uL   Immature Granulocytes 1 Not Estab. %   Immature Grans (Abs) 0.0 0.0 - 0.1 x10E3/uL  Comprehensive metabolic panel     Status: Abnormal   Collection Time: 08/04/18  1:51 PM  Result Value Ref Range   Glucose 88 65 - 99 mg/dL   BUN 18 6 - 24 mg/dL   Creatinine, Ser 0.80 0.76 - 1.27 mg/dL   GFR calc non Af Amer 101 >59 mL/min/1.73   GFR calc Af Amer 116 >59 mL/min/1.73   BUN/Creatinine Ratio 23 (H) 9 - 20   Sodium 140 134 - 144 mmol/L   Potassium 4.6 3.5 - 5.2 mmol/L   Chloride 102 96 - 106 mmol/L   CO2 26 20 - 29 mmol/L   Calcium 9.6 8.7 - 10.2 mg/dL   Total Protein 7.5 6.0 - 8.5 g/dL   Albumin 4.3 3.8 - 4.9 g/dL   Globulin, Total 3.2 1.5 - 4.5 g/dL   Albumin/Globulin Ratio 1.3 1.2 - 2.2   Bilirubin Total 0.4  0.0 - 1.2 mg/dL   Alkaline Phosphatase 72 39 - 117 IU/L   AST 22 0 - 40 IU/L   ALT 26 0 - 44 IU/L  Lipid Panel With LDL/HDL Ratio     Status: Abnormal  Collection Time: 08/04/18  1:51 PM  Result Value Ref Range   Cholesterol, Total 168 100 - 199 mg/dL   Triglycerides 161137 0 - 149 mg/dL   HDL 40 >09>39 mg/dL   VLDL Cholesterol Cal 27 5 - 40 mg/dL   LDL Calculated 604101 (H) 0 - 99 mg/dL   LDl/HDL Ratio 2.5 0.0 - 3.6 ratio    Comment:                                     LDL/HDL Ratio                                             Men  Women                               1/2 Avg.Risk  1.0    1.5                                   Avg.Risk  3.6    3.2                                2X Avg.Risk  6.2    5.0                                3X Avg.Risk  8.0    6.1   Hgb A1c w/o eAG     Status: Abnormal   Collection Time: 08/04/18  1:51 PM  Result Value Ref Range   Hgb A1c MFr Bld 5.7 (H) 4.8 - 5.6 %    Comment:          Prediabetes: 5.7 - 6.4          Diabetes: >6.4          Glycemic control for adults with diabetes: <7.0   T4, free     Status: Abnormal   Collection Time: 08/04/18  1:51 PM  Result Value Ref Range   Free T4 0.71 (L) 0.82 - 1.77 ng/dL  TSH     Status: None   Collection Time: 08/04/18  1:51 PM  Result Value Ref Range   TSH 1.150 0.450 - 4.500 uIU/mL  PSA     Status: None   Collection Time: 08/04/18  1:51 PM  Result Value Ref Range   Prostate Specific Ag, Serum 0.6 0.0 - 4.0 ng/mL    Comment: Roche ECLIA methodology. According to the American Urological Association, Serum PSA should decrease and remain at undetectable levels after radical prostatectomy. The AUA defines biochemical recurrence as an initial PSA value 0.2 ng/mL or greater followed by a subsequent confirmatory PSA value 0.2 ng/mL or greater. Values obtained with different assay methods or kits cannot be used interchangeably. Results cannot be interpreted as absolute evidence of the presence or absence of  malignant disease.     Radiology: Patient was never admitted.  No results found.  No results found.    Assessment and Plan: There are no active problems to display for this patient.   1. OSA (obstructive  sleep apnea) Cpap machine ordered for patient.   - For home use only DME continuous positive airway pressure (CPAP)  2. Morbid obesity (HCC) Obesity Counseling: Risk Assessment: An assessment of behavioral risk factors was made today and includes lack of exercise sedentary lifestyle, lack of portion control and poor dietary habits.  Risk Modification Advice: She was counseled on portion control guidelines. Restricting daily caloric intake to. . The detrimental long term effects of obesity on her health and ongoing poor compliance was also discussed with the patient.  3. Hypothyroidism due to acquired atrophy of thyroid Start synthroid, and follow up as scheduled.   General Counseling: I have discussed the findings of the evaluation and examination with Freida BusmanAllen.  I have also discussed any further diagnostic evaluation thatmay be needed or ordered today. Antonio verbalizes understanding of the findings of todays visit. We also reviewed his medications today and discussed drug interactions and side effects including but not limited excessive drowsiness and altered mental states. We also discussed that there is always a risk not just to him but also people around him. he has been encouraged to call the office with any questions or concerns that should arise related to todays visit.    Time spent: 12 This patient was seen by Blima LedgerAdam Chevette Fee AGNP-C in Collaboration with Dr. Freda MunroSaadat Khan as a part of collaborative care agreement.   I have personally obtained a history, examined the patient, evaluated laboratory and imaging results, formulated the assessment and plan and placed orders.    Yevonne PaxSaadat A Khan, MD [facility] Pulmonary and Critical Care Sleep medicine

## 2018-09-13 ENCOUNTER — Encounter: Payer: Self-pay | Admitting: Internal Medicine

## 2018-09-15 ENCOUNTER — Telehealth: Payer: Self-pay

## 2018-09-15 NOTE — Telephone Encounter (Signed)
CMN SIGNED AND PLACED IN [employer] PATIENT FOLDER. °

## 2018-09-22 ENCOUNTER — Encounter: Payer: Self-pay | Admitting: Adult Health

## 2018-09-22 ENCOUNTER — Ambulatory Visit (INDEPENDENT_AMBULATORY_CARE_PROVIDER_SITE_OTHER): Payer: Medicaid Other | Admitting: Adult Health

## 2018-09-22 ENCOUNTER — Other Ambulatory Visit: Payer: Self-pay

## 2018-09-22 ENCOUNTER — Ambulatory Visit: Payer: Medicaid Other

## 2018-09-22 VITALS — Resp 16 | Ht 73.0 in | Wt 392.0 lb

## 2018-09-22 DIAGNOSIS — B370 Candidal stomatitis: Secondary | ICD-10-CM | POA: Diagnosis not present

## 2018-09-22 DIAGNOSIS — K219 Gastro-esophageal reflux disease without esophagitis: Secondary | ICD-10-CM | POA: Diagnosis not present

## 2018-09-22 NOTE — Progress Notes (Signed)
[facility] [place], [place] [address]  Internal MEDICINE  Telephone Visit  Patient Name: [name] [name]  [id number]  [id number]  Date of Service: 09/22/2018  I connected with the patient at 435 by telephone and verified the patients identity using two identifiers.   I discussed the limitations, risks, security and privacy concerns of performing an evaluation and management service by telephone and the availability of in person appointments. I also discussed with the patient that there may be a patient responsible charge related to the service.  The patient expressed understanding and agrees to proceed.    Chief Complaint  Patient presents with  . Telephone Assessment  . Telephone Screen  . Thrush    feels like something in throat     HPI  Pt seen via telephone.  He reports a few days of irritated feeling in his throat.  He describes it as feeling like hair in his throat.  His wife looked in his throat and sees a small white patch on his uvula. He reports a history of thrush, and believes this is a reoccurrence.    Current Medication: Outpatient Encounter Medications as of 09/22/2018  Medication Sig  . ALPRAZolam (XANAX) 1 MG tablet Take 1 mg by mouth 2 (two) times daily as needed for anxiety.  . busPIRone (BUSPAR) 15 MG tablet Take 15 mg by mouth 3 (three) times daily.  Marland Kitchen gabapentin (NEURONTIN) 600 MG tablet Take 600 mg by mouth 3 (three) times daily.  Marland Kitchen levothyroxine (SYNTHROID) 50 MCG tablet Take 1 tablet (50 mcg total) by mouth daily.  . NON FORMULARY cpap device  . omeprazole (PRILOSEC) 20 MG capsule Take 1 capsule (20 mg total) by mouth daily.  Marland Kitchen PARoxetine (PAXIL) 40 MG tablet Take 40 mg by mouth every morning.  . traZODone (DESYREL) 100 MG tablet Take 100 mg by mouth at bedtime.   No facility-administered encounter medications on file as of 09/22/2018.     Surgical History: Past Surgical History:  Procedure Laterality Date  . LIVER BIOPSY       Medical History: Past Medical History:  Diagnosis Date  . Anxiety   . Arthritis   . Depression   . Fibromyalgia   . GERD (gastroesophageal reflux disease)   . Glaucoma   . Hepatitis C   . 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 Positive TB test   . Sleep apnea     Family History: Family History  Problem Relation Age of Onset  . Aneurysm Mother   . Cancer Father     Social History   Socioeconomic History  . Marital status: Married    Spouse name: Not on file  . Number of children: Not on file  . Years of education: Not on file  . Highest education level: Not on file  Occupational History  . Not on file  Social Needs  . Financial resource strain: Not on file  . Food insecurity    Worry: Not on file    Inability: Not on file  . Transportation needs    Medical: Not on file    Non-medical: Not on file  Tobacco Use  . Smoking status: Never Smoker  . Smokeless tobacco: Never Used  Substance and Sexual Activity  . Alcohol use: Not Currently  . Drug use: Not Currently  . Sexual activity: Not on file  Lifestyle  . Physical activity    Days per week: Not on file    Minutes per session: Not on  file  . Stress: Not on file  Relationships  . Social Musicianconnections    Talks on phone: Not on file    Gets together: Not on file    Attends religious service: Not on file    Active member of club or organization: Not on file    Attends meetings of clubs or organizations: Not on file    Relationship status: Not on file  . Intimate partner violence    Fear of current or ex partner: Not on file    Emotionally abused: Not on file    Physically abused: Not on file    Forced sexual activity: Not on file  Other Topics Concern  . Not on file  Social History Narrative  . Not on file      Review of Systems  Constitutional: Negative.  Negative for chills, fatigue and unexpected weight change.  HENT: Negative.  Negative for congestion, rhinorrhea, sneezing and sore throat.   Eyes:  Negative for redness.  Respiratory: Negative.  Negative for cough, chest tightness and shortness of breath.   Cardiovascular: Negative.  Negative for chest pain and palpitations.  Gastrointestinal: Negative.  Negative for abdominal pain, constipation, diarrhea, nausea and vomiting.  Endocrine: Negative.   Genitourinary: Negative.  Negative for dysuria and frequency.  Musculoskeletal: Negative.  Negative for arthralgias, back pain, joint swelling and neck pain.  Skin: Negative.  Negative for rash.  Allergic/Immunologic: Negative.   Neurological: Negative.  Negative for tremors and numbness.  Hematological: Negative for adenopathy. Does not bruise/bleed easily.  Psychiatric/Behavioral: Negative.  Negative for behavioral problems, sleep disturbance and suicidal ideas. The patient is not nervous/anxious.     Vital Signs: Resp 16   Ht 6\' 1"  (1.854 m)   Wt (!) 392 lb (177.8 kg)   BMI 51.72 kg/m    Observation/Objective:  Well sounding, NAD noted.    Assessment/Plan: 1. Thrush, oral Use magic mouth wash as discussed.  Sent to pharmacy.   2. Gastroesophageal reflux disease without esophagitis Stable, continue present management.   3. Morbid obesity (HCC) Obesity Counseling: Risk Assessment: An assessment of behavioral risk factors was made today and includes lack of exercise sedentary lifestyle, lack of portion control and poor dietary habits.  Risk Modification Advice: She was counseled on portion control guidelines. Restricting daily caloric intake to. . The detrimental long term effects of obesity on her health and ongoing poor compliance was also discussed with the patient.   General Counseling: Lidia CollumAllen verbalizes understanding of the findings of today's phone visit and agrees with plan of treatment. I have discussed any further diagnostic evaluation that may be needed or ordered today. We also reviewed his medications today. he has been encouraged to call the office with any  questions or concerns that should arise related to todays visit.    No orders of the defined types were placed in this encounter.   No orders of the defined types were placed in this encounter.   Time spent: 12 Minutes    Blima LedgerAdam Wanetta Funderburke [facility]NP-C Internal medicine

## 2018-09-24 ENCOUNTER — Other Ambulatory Visit: Payer: Self-pay | Admitting: Adult Health

## 2018-09-24 DIAGNOSIS — K219 Gastro-esophageal reflux disease without esophagitis: Secondary | ICD-10-CM

## 2018-09-24 MED ORDER — MAGIC MOUTHWASH W/LIDOCAINE: ORAL | 1 refills | Status: AC

## 2018-09-24 MED ORDER — MAGIC MOUTHWASH W/LIDOCAINE: ORAL | 1 refills | Status: DC

## 2018-10-04 ENCOUNTER — Encounter: Payer: Medicaid Other | Admitting: Adult Health

## 2018-10-05 ENCOUNTER — Other Ambulatory Visit: Payer: Self-pay

## 2018-10-05 ENCOUNTER — Ambulatory Visit (INDEPENDENT_AMBULATORY_CARE_PROVIDER_SITE_OTHER): Payer: Medicaid Other | Admitting: Psychiatry

## 2018-10-05 ENCOUNTER — Encounter: Payer: Self-pay | Admitting: Psychiatry

## 2018-10-05 DIAGNOSIS — F331 Major depressive disorder, recurrent, moderate: Secondary | ICD-10-CM | POA: Diagnosis not present

## 2018-10-05 DIAGNOSIS — F4310 Post-traumatic stress disorder, unspecified: Secondary | ICD-10-CM | POA: Diagnosis not present

## 2018-10-05 MED ORDER — BUSPIRONE HCL 15 MG PO TABS: 15.0000 mg | ORAL_TABLET | Freq: Three times a day (TID) | ORAL | 0 refills | Status: DC

## 2018-10-05 MED ORDER — TRAZODONE HCL 100 MG PO TABS: 100.0000 mg | ORAL_TABLET | Freq: Every day | ORAL | 0 refills | Status: DC

## 2018-10-05 MED ORDER — HYDROXYZINE PAMOATE 25 MG PO CAPS: 25.0000 mg | ORAL_CAPSULE | Freq: Two times a day (BID) | ORAL | 0 refills | Status: DC | PRN

## 2018-10-05 MED ORDER — PAROXETINE HCL 40 MG PO TABS: 40.0000 mg | ORAL_TABLET | ORAL | 0 refills | Status: DC
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
## 2018-11-02 ENCOUNTER — Other Ambulatory Visit: Payer: Self-pay

## 2018-11-02 ENCOUNTER — Ambulatory Visit (INDEPENDENT_AMBULATORY_CARE_PROVIDER_SITE_OTHER): Payer: Self-pay | Admitting: Psychiatry

## 2018-11-02 DIAGNOSIS — Z5329 Procedure and treatment not carried out because of patient's decision for other reasons: Secondary | ICD-10-CM

## 2018-11-02 NOTE — Progress Notes (Signed)
No response to calls 

## 2018-11-08 ENCOUNTER — Encounter: Payer: Medicaid Other | Admitting: Adult Health

## 2018-11-17 ENCOUNTER — Encounter: Payer: Medicare Other | Admitting: Adult Health

## 2018-11-17 ENCOUNTER — Ambulatory Visit (INDEPENDENT_AMBULATORY_CARE_PROVIDER_SITE_OTHER): Payer: Medicare Other | Admitting: Psychiatry

## 2018-11-17 ENCOUNTER — Encounter: Payer: Self-pay | Admitting: Psychiatry

## 2018-11-17 ENCOUNTER — Other Ambulatory Visit: Payer: Self-pay

## 2018-11-17 DIAGNOSIS — F4310 Post-traumatic stress disorder, unspecified: Secondary | ICD-10-CM

## 2018-11-17 DIAGNOSIS — F331 Major depressive disorder, recurrent, moderate: Secondary | ICD-10-CM | POA: Diagnosis not present

## 2018-11-17 NOTE — Progress Notes (Signed)
Virtual Visit via Video Note  I connected with [name] [name] on 11/17/18 at  9:35 AM EDT by a video enabled telemedicine application and verified that I am speaking with the correct person using two identifiers.   I discussed the limitations of evaluation and management by telemedicine and the availability of in person appointments. The patient expressed understanding and agreed to proceed.  I discussed the assessment and treatment plan with the patient. The patient was provided an opportunity to ask questions and all were answered. The patient agreed with the plan and demonstrated an understanding of the instructions.   The patient was advised to call back or seek an in-person evaluation if the symptoms worsen or if the condition fails to improve as anticipated.   [facility] MD OP Progress Note  11/17/2018 12:17 PM [name] [name]  MRN:  [id number]  Chief Complaint:  Chief Complaint    Follow-up     HPI: [name] [name] is a 58 year old Caucasian male, married, on disability, recently relocated to [facility] from [place], has a history of PTSD, MDD, panic attacks, fibromyalgia, degenerative disc disease, carpal tunnel syndrome, arthritis was evaluated by telemedicine today.  Patient had missed his last appointment.  He reports his wife was going through some health problems.  She had a surgery recently and is currently recovering.  He reports he has been feeling more frustrated and depressed recently.  He reports its likely due to the pandemic as well as not getting a lot of support from his family as he expected when he moved here.  He has not been able to reach out to the therapist yet.  Advised him to do so and start psychotherapy sessions.  He reports he has started taking his antidepressants as prescribed.  He does like the effect of BuSpar and it does not give him any lingering grogginess or any other side effects.  Patient denies any suicidality at this time.  He denies any homicidality.  He reports  sleep is disrupted due to not being able to find the right CPAP machine.  He is currently working with his sleep provider on that.  He denies any other concerns today. Visit Diagnosis:    ICD-10-CM   1. PTSD (post-traumatic stress disorder)  F43.10   2. MDD (major depressive disorder), recurrent episode, moderate ([place])  F33.1     Past Psychiatric History: I have reviewed past psychiatric history from my progress note on 10/05/2018  Past Medical History:  Past Medical History:  Diagnosis Date  . Anxiety   . Arthritis   . Depression   . Fibromyalgia   . GERD (gastroesophageal reflux disease)   . Glaucoma   . Hepatitis C   . 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 Positive TB test   . Sleep apnea     Past Surgical History:  Procedure Laterality Date  . LIVER BIOPSY      Family Psychiatric History: Reviewed family psychiatric history from my progress note on 10/05/2018  Family History:  Family History  Problem Relation Age of Onset  . Aneurysm Mother   . Cancer Father   . Mental illness Sister   . Drug abuse Paternal Aunt   . Drug abuse Maternal Uncle   . Mental illness Sister     Social History: I have reviewed social history from my progress note on 10/05/2018 Social History   Socioeconomic History  . Marital status: Married    Spouse name: Not on file  . Number of children: 1  . Years  of education: Not on file  . Highest education level: Not on file  Occupational History  . Not on file  Social Needs  . Financial resource strain: Not on file  . Food insecurity    Worry: Not on file    Inability: Not on file  . Transportation needs    Medical: Not on file    Non-medical: Not on file  Tobacco Use  . Smoking status: Never Smoker  . Smokeless tobacco: Never Used  Substance and Sexual Activity  . Alcohol use: Not Currently  . Drug use: Not Currently  . Sexual activity: Not on file  Lifestyle  . Physical activity    Days per week: Not on file    Minutes per session:  Not on file  . Stress: Not on file  Relationships  . Social Musician on phone: Not on file    Gets together: Not on file    Attends religious service: Not on file    Active member of club or organization: Not on file    Attends meetings of clubs or organizations: Not on file    Relationship status: Not on file  Other Topics Concern  . Not on file  Social History Narrative  . Not on file    Allergies:  Allergies  Allergen Reactions  . Zoloft [Sertraline Hcl] Anxiety    Metabolic Disorder Labs: Lab Results  Component Value Date   HGBA1C 5.7 (H) 08/04/2018   No results found for: PROLACTIN Lab Results  Component Value Date   CHOL 168 08/04/2018   TRIG 137 08/04/2018   HDL 40 08/04/2018   LDLCALC 101 (H) 08/04/2018   Lab Results  Component Value Date   TSH 1.150 08/04/2018    Therapeutic Level Labs: No results found for: LITHIUM No results found for: VALPROATE No components found for:  CBMZ  Current Medications: Current Outpatient Medications  Medication Sig Dispense Refill  . busPIRone (BUSPAR) 15 MG tablet Take 1 tablet (15 mg total) by mouth 3 (three) times daily. 270 tablet 0  . gabapentin (NEURONTIN) 600 MG tablet Take 600 mg by mouth 3 (three) times daily.    . hydrOXYzine (VISTARIL) 25 MG capsule Take 1 capsule (25 mg total) by mouth 2 (two) times daily as needed for anxiety. ONLY FOR SEVERE PANIC ATTACKS 180 capsule 0  . levothyroxine (SYNTHROID) 50 MCG tablet Take 1 tablet (50 mcg total) by mouth daily. 30 tablet 2  . NON FORMULARY cpap device    . omeprazole (PRILOSEC) 20 MG capsule Take 1 capsule (20 mg total) by mouth daily. 30 capsule 2  . PARoxetine (PAXIL) 40 MG tablet Take 1 tablet (40 mg total) by mouth every morning. 90 tablet 0  . traZODone (DESYREL) 100 MG tablet Take 1 tablet (100 mg total) by mouth at bedtime. 90 tablet 0   No current facility-administered medications for this visit.      Musculoskeletal: Strength & Muscle  Tone: UTA Gait & Station: normal Patient leans: N/A  Psychiatric Specialty Exam: Review of Systems  Psychiatric/Behavioral: Positive for depression. The patient has insomnia.   All other systems reviewed and are negative.   There were no vitals taken for this visit.There is no height or weight on file to calculate BMI.  General Appearance: Casual  Eye Contact:  Fair  Speech:  Clear and Coherent  Volume:  Normal  Mood:  Depressed  Affect:  Congruent  Thought Process:  Goal Directed and Descriptions of Associations:  Intact  Orientation:  Full (Time, Place, and Person)  Thought Content: Logical   Suicidal Thoughts:  No  Homicidal Thoughts:  No  Memory:  Immediate;   Fair Recent;   Fair Remote;   Fair  Judgement:  Fair  Insight:  Fair  Psychomotor Activity:  Normal  Concentration:  Concentration: Fair and Attention Span: Fair  Recall:  F[employer]Fair  Fund of Knowledge: Fair  Language: Fair  Akathisia:  No  Handed:  Right  AIMS (if indicated): Denies tremors, rigidity  Assets:  Communication Skills Desire for Improvement Social Support  ADL's:  Intact  Cognition: WNL  Sleep:  Poor   Screenings: PHQ2-9     [facility]  PHQ-2 Total Score  2  0  PHQ-9 Total Score  13  -       Assessment and Plan: [name] Busmanllen is a 58 year old Caucasian male, married, on disability, lives in [place] was evaluated by telemedicine today.  Patient with history of PTSD, degenerative disc disease, carpal tunnel syndrome, arthritis, fibromyalgia.  Patient with psychosocial stressors of his wife's health problems, current pandemic and the recent relocation.  Patient does have a history of trauma as well as multiple health problems.  He will benefit from continued medication management as well as psychotherapy sessions.  Plan PTSD- unstable Paxil 40 mg p.o. daily BuSpar 15 mg p.o. 3 times  daily Trazodone 100 mg p.o. nightly Patient was referred for psychotherapy sessions in the past however he was not able to schedule an appointment.  He agrees to call back today.  MDD-unstable Continue medications.  Restart CBT.  Patient with history of recent thyroid abnormalities is currently under the care of his provider who is managing it for him.  He has upcoming repeat labs coming up.  Patient was advised to sign a release to obtain medical records from his previous psychiatrist.  Follow-up in clinic in 4 weeks or sooner if needed.  November fourth at 2:30 PM  I have spent atleast 15 minutes non face to face with patient today. More than 50 % of the time was spent for psychoeducation and supportive psychotherapy and care coordination. This note was generated in part or whole with voice recognition software. Voice recognition is usually quite accurate but there are transcription errors that can and very often do occur. I apologize for any typographical errors that were not detected and corrected.      Jomarie LongsSaramma Kelce Bouton, MD 11/17/2018, 12:17 PM

## 2018-11-22 ENCOUNTER — Other Ambulatory Visit: Payer: Self-pay | Admitting: Adult Health

## 2018-11-23 LAB — T4, FREE: Free T4: 0.93 ng/dL (ref 0.82–1.77)

## 2018-11-23 LAB — TSH: TSH: 1.01 u[IU]/mL (ref 0.450–4.500)

## 2018-11-24 ENCOUNTER — Encounter: Payer: Medicare Other | Admitting: Adult Health

## 2018-12-05 ENCOUNTER — Other Ambulatory Visit: Payer: Self-pay | Admitting: Adult Health

## 2018-12-08 ENCOUNTER — Ambulatory Visit (INDEPENDENT_AMBULATORY_CARE_PROVIDER_SITE_OTHER): Payer: Medicare Other | Admitting: Nurse Practitioner

## 2018-12-08 ENCOUNTER — Encounter: Payer: Self-pay | Admitting: Nurse Practitioner

## 2018-12-08 ENCOUNTER — Other Ambulatory Visit: Payer: Self-pay

## 2018-12-08 VITALS — BP 134/56 | HR 77 | Temp 97.9°F | Resp 16 | Ht 73.0 in | Wt 394.0 lb

## 2018-12-08 DIAGNOSIS — L0292 Furuncle, unspecified: Secondary | ICD-10-CM

## 2018-12-08 DIAGNOSIS — E039 Hypothyroidism, unspecified: Secondary | ICD-10-CM

## 2018-12-08 DIAGNOSIS — Z0001 Encounter for general adult medical examination with abnormal findings: Secondary | ICD-10-CM | POA: Diagnosis not present

## 2018-12-08 DIAGNOSIS — R21 Rash and other nonspecific skin eruption: Secondary | ICD-10-CM

## 2018-12-08 DIAGNOSIS — R300 Dysuria: Secondary | ICD-10-CM

## 2018-12-08 MED ORDER — LEVOTHYROXINE SODIUM 50 MCG PO TABS: 50.0000 ug | ORAL_TABLET | Freq: Every day | ORAL | 3 refills | Status: DC

## 2018-12-08 MED ORDER — EUCRISA 2 % EX OINT: 1.0000 "application " | TOPICAL_OINTMENT | Freq: Two times a day (BID) | CUTANEOUS | 0 refills | Status: DC | PRN

## 2018-12-08 MED ORDER — SULFAMETHOXAZOLE-TRIMETHOPRIM 800-160 MG PO TABS: 1.0000 | ORAL_TABLET | Freq: Two times a day (BID) | ORAL | 0 refills | Status: DC

## 2018-12-08 NOTE — Progress Notes (Signed)
[facility] [address] [place], [place] [address]  Internal MEDICINE  Office Visit Note  Patient Name: [name] [name]  [id number]  [id number]  Date of Service: 12/18/2018  Chief Complaint  Patient presents with  . Annual Exam    review labs  . Post-Traumatic Stress Disorder  . Quality Metric Gaps    colonoscopy  . Rash    on hands, has gotten better, new breakout came up 2 days ago, adam gave cream and pt is out     The patient is here for health maintenance exam. He started on 50 mcg of levothyroxine in June 2020. Recent TSH 1.010 and free T4 0.93. Denies extreme fatigue, mood changes or weight gain. Complains of left hand boil that has been present for 3 days. Wife attempted to aspirate last night, lots of puss was drained  Also has patches of dry skin on bilateral hands. Boil produces tenderness to touch, redness surrounding boil and slight warmth. Denies fever or body aches. Has had a boil on his buttocks before that required incision and drainage and oral antibiotics.       Current Medication: Outpatient Encounter Medications as of 12/08/2018  Medication Sig  . busPIRone (BUSPAR) 15 MG tablet Take 1 tablet (15 mg total) by mouth 3 (three) times daily.  Marland Kitchen gabapentin (NEURONTIN) 600 MG tablet Take 600 mg by mouth 3 (three) times daily.  . hydrOXYzine (VISTARIL) 25 MG capsule Take 1 capsule (25 mg total) by mouth 2 (two) times daily as needed for anxiety. ONLY FOR SEVERE PANIC ATTACKS  . levothyroxine (SYNTHROID) 50 MCG tablet Take 1 tablet (50 mcg total) by mouth daily.  . NON FORMULARY cpap device  . omeprazole (PRILOSEC) 20 MG capsule Take 1 capsule (20 mg total) by mouth daily.  Marland Kitchen PARoxetine (PAXIL) 40 MG tablet Take 1 tablet (40 mg total) by mouth every morning.  . traZODone (DESYREL) 100 MG tablet Take 1 tablet (100 mg total) by mouth at bedtime.  . [DISCONTINUED] levothyroxine (SYNTHROID) 50 MCG tablet TAKE 1 TABLET BY MOUTH EVERY DAY  . Crisaborole  (EUCRISA) 2 % OINT Apply 1 application topically 2 (two) times daily as needed.  . sulfamethoxazole-trimethoprim (BACTRIM DS) 800-160 MG tablet Take 1 tablet by mouth 2 (two) times daily.   No facility-administered encounter medications on file as of 12/08/2018.     Surgical History: Past Surgical History:  Procedure Laterality Date  . LIVER BIOPSY      Medical History: Past Medical History:  Diagnosis Date  . Anxiety   . Arthritis   . Depression   . Fibromyalgia   . GERD (gastroesophageal reflux disease)   . Glaucoma   . Hepatitis C   . Juvenile epilepsy ([facility])   . Positive TB test   . Sleep apnea     Family History: Family History  Problem Relation Age of Onset  . Aneurysm Mother   . Cancer Father   . Mental illness Sister   . Drug abuse Paternal Aunt   . Drug abuse Maternal Uncle   . Mental illness Sister     Social History   Socioeconomic History  . Marital status: Married    Spouse name: Not on file  . Number of children: 1  . Years of education: Not on file  . Highest education level: Not on file  Occupational History  . Not on file  Social Needs  . Financial resource strain: Not on file  . Food insecurity    Worry:  Not on file    Inability: Not on file  . Transportation needs    Medical: Not on file    Non-medical: Not on file  Tobacco Use  . Smoking status: Never Smoker  . Smokeless tobacco: Never Used  Substance and Sexual Activity  . Alcohol use: Not Currently  . Drug use: Not Currently  . Sexual activity: Not on file  Lifestyle  . Physical activity    Days per week: Not on file    Minutes per session: Not on file  . Stress: Not on file  Relationships  . Social Musicianconnections    Talks on phone: Not on file    Gets together: Not on file    Attends religious service: Not on file    Active member of club or organization: Not on file    Attends meetings of clubs or organizations: Not on file    Relationship status: Not on file  . Intimate  partner violence    Fear of current or ex partner: Not on file    Emotionally abused: Not on file    Physically abused: Not on file    Forced sexual activity: Not on file  Other Topics Concern  . Not on file  Social History Narrative  . Not on file      Review of Systems  Constitutional: Negative for activity change, appetite change, chills and fatigue.  HENT: Negative for congestion, postnasal drip, sinus pressure and sinus pain.   Respiratory: Negative for shortness of breath and wheezing.   Cardiovascular: Negative for chest pain and palpitations.  Gastrointestinal: Negative for constipation, diarrhea, nausea and vomiting.  Endocrine: Negative for cold intolerance, heat intolerance, polydipsia and polyuria.  Genitourinary: Negative for dysuria, frequency and urgency.  Musculoskeletal: Negative for arthralgias and myalgias.  Skin: Positive for rash.       Bilateral hand patches of dry scaly skin. Left hand boil has been present for 3 days. Drained by wife last night, produced large amount of puss. Today continues to be tender to touch, warm and red.  Allergic/Immunologic: Negative for environmental allergies.  Neurological: Negative for dizziness and headaches.  Hematological: Negative for adenopathy.  Psychiatric/Behavioral: Negative for decreased concentration. The patient is not nervous/anxious.     Today's Vitals   12/08/18 1425  BP: (!) 134/56  Pulse: 77  Resp: 16  Temp: 97.9 F (36.6 C)  SpO2: 95%  Weight: (!) 394 lb (178.7 kg)  Height: 6\' 1"  (1.854 m)   Body mass index is 51.98 kg/m.  Physical Exam Vitals signs and nursing note reviewed.  Constitutional:      Appearance: Normal appearance. He is well-developed.  HENT:     Head: Normocephalic and atraumatic.     Nose: Nose normal.  Eyes:     Extraocular Movements: Extraocular movements intact.     Pupils: Pupils are equal, round, and reactive to light.  Neck:     Musculoskeletal: Normal range of motion  and neck supple.  Cardiovascular:     Rate and Rhythm: Normal rate and regular rhythm.     Pulses: Normal pulses.     Heart sounds: Normal heart sounds.  Pulmonary:     Effort: Pulmonary effort is normal.     Breath sounds: Normal breath sounds.  Abdominal:     General: Bowel sounds are normal.     Palpations: Abdomen is soft.     Tenderness: There is no abdominal tenderness.  Musculoskeletal: Normal range of motion.  Skin:  General: Skin is warm and dry.     Comments: Bilateral hand patches of dry scaly skin.  Left hand boil. Incision and drainage performed. Verbal consent obtained from patient before procedure. Boild and skin surrounding cleaned with alcohol pad and left to dry. 1cc of 1% Lidocaine injected into surrounding tissue of boil. 0.5 cm incision made to let boil drain. Drainage was blood and white puss, sample obtained by swab to be cultured. Area cleaned with guaze, telfa pad placed on wound and wrapped with Kerlix. Instructed patient to keep area clean and covered if going to be outside. Clean with warm water and Dial soap only.  Neurological:     General: No focal deficit present.     Mental Status: He is alert and oriented to person, place, and time.  Psychiatric:        Mood and Affect: Mood normal.        Behavior: Behavior normal.        Thought Content: Thought content normal.        Judgment: Judgment normal.      Depression screen [facility] 2/9 12/08/2018 07/29/2018 07/15/2018  Decreased Interest 0 0 0  Down, Depressed, Hopeless 0 2 0  PHQ - 2 Score 0 2 0  Altered sleeping - 0 -  Tired, decreased energy - 3 -  Change in appetite - 3 -  Feeling bad or failure about yourself  - 1 -  Trouble concentrating - 3 -  Moving slowly or fidgety/restless - 0 -  Suicidal thoughts - 1 -  PHQ-9 Score - 13 -  Difficult doing work/chores - Very difficult -    Functional Status Survey: Is the patient deaf or have difficulty hearing?: No Does the patient have difficulty  seeing, even when wearing glasses/contacts?: No Does the patient have difficulty concentrating, remembering, or making decisions?: No Does the patient have difficulty walking or climbing stairs?: No Does the patient have difficulty dressing or bathing?: No Does the patient have difficulty doing errands alone such as visiting a doctor's office or shopping?: No  MMSE - [place] Exam 12/18/2018  Orientation to time 5  Orientation to Place 5  Registration 3  Attention/ Calculation 5  Recall 3  Language- name 2 objects 2  Language- repeat 1  Language- follow 3 step command 3  Language- read & follow direction 1  Write a sentence 1  Copy design 1  Total score 30    Fall Risk  12/08/2018 07/29/2018 07/15/2018  Falls in the past year? 0 0 0  Number falls in past yr: - 0 0    Assessment/Plan: 1. Encounter for general adult medical examination with abnormal findings Annual health maintenance exam today.   2. Hypothyroidism, unspecified type Stable. Continue levothyroxine as prescribed  - levothyroxine (SYNTHROID) 50 MCG tablet; Take 1 tablet (50 mcg total) by mouth daily.  Dispense: 30 tablet; Refill: 3 - TSH + free T4  3. Boil Incision and drainage performed. After care cleanse with warm water and mild soap, keep covered if going to be working outside. - Anaerobic and Aerobic Culture - sulfamethoxazole-trimethoprim (BACTRIM DS) 800-160 MG tablet; Take 1 tablet by mouth 2 (two) times daily.  Dispense: 20 tablet; Refill: 0  4. Rash May use Eucrisa twice daily as needed.  - Crisaborole (EUCRISA) 2 % OINT; Apply 1 application topically 2 (two) times daily as needed.  Dispense: 100 g; Refill: 0  5. Dysuria - UA/M w/rflx Culture, Routine  This patient was  seen by Vincent Gros FNP Collaboration with Dr Lyndon Code as a part of collaborative care agreement   General Counseling: Lidia Collum understanding of the findings of todays visit and agrees with plan of treatment.  I have discussed any further diagnostic evaluation that may be needed or ordered today. We also reviewed his medications today. he has been encouraged to call the office with any questions or concerns that should arise related to todays visit.    Orders Placed This Encounter  Procedures  . Anaerobic and Aerobic Culture  . Microscopic Examination  . UA/M w/rflx Culture, Routine  . TSH + free T4    Meds ordered this encounter  Medications  . sulfamethoxazole-trimethoprim (BACTRIM DS) 800-160 MG tablet    Sig: Take 1 tablet by mouth 2 (two) times daily.    Dispense:  20 tablet    Refill:  0    Order Specific Question:   Supervising Provider    Answer:   Lyndon Code [[id number]]  . levothyroxine (SYNTHROID) 50 MCG tablet    Sig: Take 1 tablet (50 mcg total) by mouth daily.    Dispense:  30 tablet    Refill:  3    Order Specific Question:   Supervising Provider    Answer:   Lyndon Code [[id number]]  . Crisaborole (EUCRISA) 2 % OINT    Sig: Apply 1 application topically 2 (two) times daily as needed.    Dispense:  100 g    Refill:  0    Order Specific Question:   Supervising Provider    Answer:   Lyndon Code [[id number]]    Time spent: 63 Minutes      Dr Lyndon Code Internal medicine

## 2018-12-08 NOTE — Progress Notes (Deleted)
[facility] [place], [place] [address]  Internal MEDICINE  Office Visit Note  Patient Name: [name] [name]  [id number]  [id number]  Date of Service: 12/08/2018  Chief Complaint  Patient presents with  . Annual Exam    review labs  . Post-Traumatic Stress Disorder  . Quality Metric Gaps    colonoscopy  . Rash    on hands, has gotten better, new breakout came up 2 days ago, adam gave cream and pt is out     HPI     Current Medication: Outpatient Encounter Medications as of 12/08/2018  Medication Sig  . busPIRone (BUSPAR) 15 MG tablet Take 1 tablet (15 mg total) by mouth 3 (three) times daily.  Marland Kitchen gabapentin (NEURONTIN) 600 MG tablet Take 600 mg by mouth 3 (three) times daily.  . hydrOXYzine (VISTARIL) 25 MG capsule Take 1 capsule (25 mg total) by mouth 2 (two) times daily as needed for anxiety. ONLY FOR SEVERE PANIC ATTACKS  . levothyroxine (SYNTHROID) 50 MCG tablet TAKE 1 TABLET BY MOUTH EVERY DAY  . NON FORMULARY cpap device  . omeprazole (PRILOSEC) 20 MG capsule Take 1 capsule (20 mg total) by mouth daily.  Marland Kitchen PARoxetine (PAXIL) 40 MG tablet Take 1 tablet (40 mg total) by mouth every morning.  . traZODone (DESYREL) 100 MG tablet Take 1 tablet (100 mg total) by mouth at bedtime.   No facility-administered encounter medications on file as of 12/08/2018.     Surgical History: Past Surgical History:  Procedure Laterality Date  . LIVER BIOPSY      Medical History: Past Medical History:  Diagnosis Date  . Anxiety   . Arthritis   . Depression   . Fibromyalgia   . GERD (gastroesophageal reflux disease)   . Glaucoma   . Hepatitis C   . 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 Positive TB test   . Sleep apnea     Family History: Family History  Problem Relation Age of Onset  . Aneurysm Mother   . Cancer Father   . Mental illness Sister   . Drug abuse Paternal Aunt   . Drug abuse Maternal Uncle   . Mental illness Sister     Social History    Socioeconomic History  . Marital status: Married    Spouse name: Not on file  . Number of children: 1  . Years of education: Not on file  . Highest education level: Not on file  Occupational History  . Not on file  Social Needs  . Financial resource strain: Not on file  . Food insecurity    Worry: Not on file    Inability: Not on file  . Transportation needs    Medical: Not on file    Non-medical: Not on file  Tobacco Use  . Smoking status: Never Smoker  . Smokeless tobacco: Never Used  Substance and Sexual Activity  . Alcohol use: Not Currently  . Drug use: Not Currently  . Sexual activity: Not on file  Lifestyle  . Physical activity    Days per week: Not on file    Minutes per session: Not on file  . Stress: Not on file  Relationships  . Social Herbalist on phone: Not on file    Gets together: Not on file    Attends religious service: Not on file    Active member of club or organization: Not on file    Attends meetings of clubs or organizations:  Not on file    Relationship status: Not on file  . Intimate partner violence    Fear of current or ex partner: Not on file    Emotionally abused: Not on file    Physically abused: Not on file    Forced sexual activity: Not on file  Other Topics Concern  . Not on file  Social History Narrative  . Not on file      Review of Systems  Vital Signs: BP (!) 134/56   Pulse 77   Temp 97.9 F (36.6 C)   Resp 16   Ht 6\' 1"  (1.854 m)   Wt (!) 394 lb (178.7 kg)   SpO2 95%   BMI 51.98 kg/m    Physical Exam     Assessment/Plan:   General Counseling: Aly verbalizes understanding of the findings of todays visit and agrees with plan of treatment. I have discussed any further diagnostic evaluation that may be needed or ordered today. We also reviewed his medications today. he has been encouraged to call the office with any questions or concerns that should arise related to todays visit.    Orders  Placed This Encounter  Procedures  . UA/M w/rflx Culture, Routine    No orders of the defined types were placed in this encounter.   Time spent:*** Minutes      Dr Internal medicine

## 2018-12-09 LAB — MICROSCOPIC EXAMINATION
Bacteria, UA: NONE SEEN
Epithelial Cells (non renal): NONE SEEN /hpf (ref 0–10)

## 2018-12-09 LAB — UA/M W/RFLX CULTURE, ROUTINE
Bilirubin, UA: NEGATIVE
Glucose, UA: NEGATIVE
Ketones, UA: NEGATIVE
Leukocytes,UA: NEGATIVE
Nitrite, UA: NEGATIVE
Protein,UA: NEGATIVE
RBC, UA: NEGATIVE
Specific Gravity, UA: 1.025 (ref 1.005–1.030)
Urobilinogen, Ur: 0.2 mg/dL (ref 0.2–1.0)
pH, UA: 5.5 (ref 5.0–7.5)

## 2018-12-12 LAB — ANAEROBIC AND AEROBIC CULTURE

## 2018-12-13 ENCOUNTER — Telehealth: Payer: Self-pay

## 2018-12-13 NOTE — Telephone Encounter (Signed)
Per Dr. Clayborn Bigness, called pt and advised him to continue his abx. Also asked pt to see how he is feeling and he feels fine and the boil is better. Advised pt to call us with any questions and concerns.

## 2018-12-18 DIAGNOSIS — E039 Hypothyroidism, unspecified: Secondary | ICD-10-CM | POA: Insufficient documentation

## 2018-12-18 DIAGNOSIS — L0292 Furuncle, unspecified: Secondary | ICD-10-CM | POA: Insufficient documentation

## 2018-12-18 DIAGNOSIS — R300 Dysuria: Secondary | ICD-10-CM | POA: Insufficient documentation

## 2018-12-18 DIAGNOSIS — Z0001 Encounter for general adult medical examination with abnormal findings: Secondary | ICD-10-CM | POA: Insufficient documentation

## 2018-12-18 DIAGNOSIS — R21 Rash and other nonspecific skin eruption: Secondary | ICD-10-CM | POA: Insufficient documentation

## 2018-12-20 ENCOUNTER — Ambulatory Visit: Payer: Medicare Other | Admitting: Adult Health

## 2018-12-21 ENCOUNTER — Other Ambulatory Visit: Payer: Self-pay

## 2018-12-21 ENCOUNTER — Ambulatory Visit (INDEPENDENT_AMBULATORY_CARE_PROVIDER_SITE_OTHER): Payer: Medicare Other | Admitting: Psychiatry

## 2018-12-21 ENCOUNTER — Encounter: Payer: Self-pay | Admitting: Psychiatry

## 2018-12-21 DIAGNOSIS — F331 Major depressive disorder, recurrent, moderate: Secondary | ICD-10-CM | POA: Diagnosis not present

## 2018-12-21 DIAGNOSIS — F4310 Post-traumatic stress disorder, unspecified: Secondary | ICD-10-CM | POA: Diagnosis not present

## 2018-12-21 MED ORDER — ARIPIPRAZOLE 2 MG PO TABS: 2.0000 mg | ORAL_TABLET | Freq: Every day | ORAL | 1 refills | Status: DC

## 2018-12-21 MED ORDER — HYDROXYZINE PAMOATE 25 MG PO CAPS: 25.0000 mg | ORAL_CAPSULE | Freq: Two times a day (BID) | ORAL | 0 refills | Status: DC | PRN

## 2018-12-21 MED ORDER — PAROXETINE HCL 40 MG PO TABS: 40.0000 mg | ORAL_TABLET | ORAL | 0 refills | Status: DC

## 2018-12-21 MED ORDER — BUSPIRONE HCL 15 MG PO TABS: 15.0000 mg | ORAL_TABLET | Freq: Three times a day (TID) | ORAL | 0 refills | Status: DC

## 2018-12-21 MED ORDER — TRAZODONE HCL 100 MG PO TABS: 150.0000 mg | ORAL_TABLET | Freq: Every day | ORAL | 0 refills | Status: DC

## 2018-12-21 NOTE — Progress Notes (Signed)
Virtual Visit via Video Note  I connected with [name] [name] on 12/21/18 at  2:15 PM EST by a video enabled telemedicine application and verified that I am speaking with the correct person using two identifiers.   I discussed the limitations of evaluation and management by telemedicine and the availability of in person appointments. The patient expressed understanding and agreed to proceed.    I discussed the assessment and treatment plan with the patient. The patient was provided an opportunity to ask questions and all were answered. The patient agreed with the plan and demonstrated an understanding of the instructions.   The patient was advised to call back or seek an in-person evaluation if the symptoms worsen or if the condition fails to improve as anticipated.  BH MD OP Progress Note  12/21/2018 5:21 PM [name] [name]  MRN:  [id number]  Chief Complaint:  Chief Complaint    Follow-up     HPI: [name] [name] is a 58 year old Caucasian male, married, on disability, recently relocated to [facility] from [place], has a history of PTSD, MDD, panic attacks, fibromyalgia, degenerative disc disease, carpal tunnel syndrome, arthritis was evaluated by telemedicine today.  Patient today reports he continues to struggle with depressive symptoms.  He struggles with lack of motivation.  He continues to be socially withdrawn and has been doing that for a long time.  Patient reports he struggles with lack of energy which is ongoing.  He reports he does have a CPAP which he uses for his obstructive sleep apnea which he does not think is working well.  He is planning to go back to see his provider for the same.This could also be contributing to his fatigue during the day.  Patient continues to struggle with trauma related symptoms-anxiety when riding in a bus , sleep issues.  Patient reports he is scheduled to see a therapist soon and is motivated to start psychotherapy sessions.  Patient denies any suicidality,  homicidality or perceptual disturbances.  He reports good support system from his wife. Visit Diagnosis:    ICD-10-CM   1. PTSD (post-traumatic stress disorder)  F43.10 busPIRone (BUSPAR) 15 MG tablet    PARoxetine (PAXIL) 40 MG tablet    hydrOXYzine (VISTARIL) 25 MG capsule    traZODone (DESYREL) 100 MG tablet  2. MDD (major depressive disorder), recurrent episode, moderate (HCC)  F33.1 ARIPiprazole (ABILIFY) 2 MG tablet    busPIRone (BUSPAR) 15 MG tablet    PARoxetine (PAXIL) 40 MG tablet    hydrOXYzine (VISTARIL) 25 MG capsule    Past Psychiatric History: Reviewed past psychiatric history from my progress note on 10/05/2018  Past Medical History:  Past Medical History:  Diagnosis Date  . Anxiety   . Arthritis   . Depression   . Fibromyalgia   . GERD (gastroesophageal reflux disease)   . Glaucoma   . Hepatitis C   . Juvenile epilepsy (HCC)   . Positive TB test   . Sleep apnea     Past Surgical History:  Procedure Laterality Date  . LIVER BIOPSY      Family Psychiatric History: Reviewed family psychiatric history from my progress note on 10/05/2018  Family History:  Family History  Problem Relation Age of Onset  . Aneurysm Mother   . Cancer Father   . Mental illness Sister   . Drug abuse Paternal Aunt   . Drug abuse Maternal Uncle   . Mental illness Sister     Social History: Reviewed social history from my progress note on 10/05/2018  Social History   Socioeconomic History  . Marital status: Married    Spouse name: Not on file  . Number of children: 1  . Years of education: Not on file  . Highest education level: Not on file  Occupational History  . Not on file  Social Needs  . Financial resource strain: Not on file  . Food insecurity    Worry: Not on file    Inability: Not on file  . Transportation needs    Medical: Not on file    Non-medical: Not on file  Tobacco Use  . Smoking status: Never Smoker  . Smokeless tobacco: Never Used  Substance and  Sexual Activity  . Alcohol use: Not Currently  . Drug use: Not Currently  . Sexual activity: Not on file  Lifestyle  . Physical activity    Days per week: Not on file    Minutes per session: Not on file  . Stress: Not on file  Relationships  . Social Herbalist on phone: Not on file    Gets together: Not on file    Attends religious service: Not on file    Active member of club or organization: Not on file    Attends meetings of clubs or organizations: Not on file    Relationship status: Not on file  Other Topics Concern  . Not on file  Social History Narrative  . Not on file    Allergies:  Allergies  Allergen Reactions  . Zoloft [Sertraline Hcl] Anxiety    Metabolic Disorder Labs: Lab Results  Component Value Date   HGBA1C 5.7 (H) 08/04/2018   No results found for: PROLACTIN Lab Results  Component Value Date   CHOL 168 08/04/2018   TRIG 137 08/04/2018   HDL 40 08/04/2018   LDLCALC 101 (H) 08/04/2018   Lab Results  Component Value Date   TSH 1.010 11/22/2018   TSH 1.150 08/04/2018    Therapeutic Level Labs: No results found for: LITHIUM No results found for: VALPROATE No components found for:  CBMZ  Current Medications: Current Outpatient Medications  Medication Sig Dispense Refill  . ARIPiprazole (ABILIFY) 2 MG tablet Take 1 tablet (2 mg total) by mouth daily with breakfast. 30 tablet 1  . busPIRone (BUSPAR) 15 MG tablet Take 1 tablet (15 mg total) by mouth 3 (three) times daily. 270 tablet 0  . Crisaborole (EUCRISA) 2 % OINT Apply 1 application topically 2 (two) times daily as needed. 100 g 0  . gabapentin (NEURONTIN) 600 MG tablet Take 600 mg by mouth 3 (three) times daily.    . hydrOXYzine (VISTARIL) 25 MG capsule Take 1 capsule (25 mg total) by mouth 2 (two) times daily as needed for anxiety. ONLY FOR SEVERE PANIC ATTACKS 180 capsule 0  . levothyroxine (SYNTHROID) 50 MCG tablet Take 1 tablet (50 mcg total) by mouth daily. 30 tablet 3  . NON  FORMULARY cpap device    . omeprazole (PRILOSEC) 20 MG capsule Take 1 capsule (20 mg total) by mouth daily. 30 capsule 2  . PARoxetine (PAXIL) 40 MG tablet Take 1 tablet (40 mg total) by mouth every morning. 90 tablet 0  . sulfamethoxazole-trimethoprim (BACTRIM DS) 800-160 MG tablet Take 1 tablet by mouth 2 (two) times daily. 20 tablet 0  . traZODone (DESYREL) 100 MG tablet Take 1.5 tablets (150 mg total) by mouth at bedtime. 135 tablet 0   No current facility-administered medications for this visit.      Musculoskeletal:  Strength & Muscle Tone: UTA Gait & Station: normal Patient leans: N/A  Psychiatric Specialty Exam: Review of Systems  Psychiatric/Behavioral: Positive for depression. The patient has insomnia.   All other systems reviewed and are negative.   There were no vitals taken for this visit.There is no height or weight on file to calculate BMI.  General Appearance: Casual  Eye Contact:  Fair  Speech:  Clear and Coherent  Volume:  Normal  Mood:  Depressed  Affect:  Congruent  Thought Process:  Goal Directed and Descriptions of Associations: Intact  Orientation:  Full (Time, Place, and Person)  Thought Content: Logical   Suicidal Thoughts:  No  Homicidal Thoughts:  No  Memory:  Immediate;   Fair Recent;   Fair Remote;   Fair  Judgement:  Fair  Insight:  Fair  Psychomotor Activity:  Normal  Concentration:  Concentration: Fair and Attention Span: Fair  Recall:  F[employer]Fair  Fund of Knowledge: Fair  Language: Fair  Akathisia:  No  Handed:  Right  AIMS (if indicated): Denies tremors, rigidity  Assets:  Communication Skills Desire for Improvement Social Support  ADL's:  Intact  Cognition: WNL  Sleep:  restless reports CPAP may not be working well    Screenings: Mini-Mental     [facility]  Total Score (max 30 points )  30    PHQ2-9     [facility]  PHQ-2 Total Score  0  2  0  PHQ-9 Total Score  -  13  -       Assessment and Plan: [name] Busmanllen is a 58 year old Caucasian male, married, on disability, lives in [place] was evaluated by telemedicine today.  Patient with history of PTSD, degenerative disc disease, carpal tunnel syndrome, arthritis, fibromyalgia.  Patient with psychosocial stressors of his wife's health problems, current pandemic at the recent relocation.  He does have a history of trauma as well as multiple health problems.  He will benefit from continued medication management as well as psychotherapy sessions since he continues to struggle with depression and sleep problems.  Plan PTSD-some progress Paxil 40 mg p.o. daily BuSpar 15 mg p.o. 3 times daily Increase trazodone to 150 mg p.o. nightly Patient reports he has appointment with a therapist-advised him to call the clinic back and leave a message about who his appointment is with.  MDD-some progress Paxil 40 mg p.o. daily Start Abilify 2 mg p.o. daily  Patient will benefit from the following labs-lipid panel, hemoglobin A1c, prolactin as well as EKG to monitor QTC if not already done.  Pending [place]  Follow-up in clinic in 3 to 4 weeks or sooner if needed.  December 2 at 4:40 PM  I have spent atleast 15 minutes non face to face with patient today. More than 50 % of the time was spent for psychoeducation and supportive psychotherapy and care coordination. This note was generated in part or whole with voice recognition software. Voice recognition is usually quite accurate but there are transcription errors that can and very often do occur. I apologize for any typographical errors that were not detected and corrected.        Jomarie LongsSaramma Neng Albee, MD 12/21/2018, 5:21 PM

## 2018-12-30 ENCOUNTER — Telehealth: Payer: Self-pay

## 2018-12-30 NOTE — Telephone Encounter (Signed)
Confirmed appointment with patient. klh °

## 2019-01-03 ENCOUNTER — Other Ambulatory Visit: Payer: Self-pay

## 2019-01-03 ENCOUNTER — Ambulatory Visit (INDEPENDENT_AMBULATORY_CARE_PROVIDER_SITE_OTHER): Payer: Medicare Other | Admitting: Adult Health

## 2019-01-03 ENCOUNTER — Encounter: Payer: Self-pay | Admitting: Adult Health

## 2019-01-03 VITALS — BP 143/66 | HR 77 | Temp 97.4°F | Resp 16 | Ht 73.0 in | Wt 390.0 lb

## 2019-01-03 DIAGNOSIS — L0292 Furuncle, unspecified: Secondary | ICD-10-CM

## 2019-01-03 DIAGNOSIS — K219 Gastro-esophageal reflux disease without esophagitis: Secondary | ICD-10-CM | POA: Diagnosis not present

## 2019-01-03 DIAGNOSIS — R030 Elevated blood-pressure reading, without diagnosis of hypertension: Secondary | ICD-10-CM

## 2019-01-03 DIAGNOSIS — R21 Rash and other nonspecific skin eruption: Secondary | ICD-10-CM | POA: Diagnosis not present

## 2019-01-03 NOTE — Progress Notes (Signed)
[facility] [place] [address]  Internal MEDICINE  Office Visit Note  Patient Name: [name] [name]  [id number]  [id number]  Date of Service: 01/03/2019  Chief Complaint  Patient presents with  . Recurrent Skin Infections    HPI  Pt is here for follow up.  About 2 weeks ago he had an incision and drainage to his left hand here in the office.  He was placed on Bactrim, and now it has completely healed.  He has a small red area over the incision site which looks like new skin.  He does continue to have multiple dry/cracked sites on his hands.  He reports he is using the Eucrisa cream with good results.  However, he does not feel like it is going away, only maintaining.  He would like to see dermatology at this time.    Current Medication: Outpatient Encounter Medications as of 01/03/2019  Medication Sig  . ARIPiprazole (ABILIFY) 2 MG tablet Take 1 tablet (2 mg total) by mouth daily with breakfast.  . busPIRone (BUSPAR) 15 MG tablet Take 1 tablet (15 mg total) by mouth 3 (three) times daily.  Stasia Cavalier (EUCRISA) 2 % OINT Apply 1 application topically 2 (two) times daily as needed.  . gabapentin (NEURONTIN) 600 MG tablet Take 600 mg by mouth 3 (three) times daily.  . hydrOXYzine (VISTARIL) 25 MG capsule Take 1 capsule (25 mg total) by mouth 2 (two) times daily as needed for anxiety. ONLY FOR SEVERE PANIC ATTACKS  . levothyroxine (SYNTHROID) 50 MCG tablet Take 1 tablet (50 mcg total) by mouth daily.  . NON FORMULARY cpap device  . omeprazole (PRILOSEC) 20 MG capsule Take 1 capsule (20 mg total) by mouth daily.  Marland Kitchen PARoxetine (PAXIL) 40 MG tablet Take 1 tablet (40 mg total) by mouth every morning.  . sulfamethoxazole-trimethoprim (BACTRIM DS) 800-160 MG tablet Take 1 tablet by mouth 2 (two) times daily.  . traZODone (DESYREL) 100 MG tablet Take 1.5 tablets (150 mg total) by mouth at bedtime.   No facility-administered encounter medications on file as of  01/03/2019.     Surgical History: Past Surgical History:  Procedure Laterality Date  . LIVER BIOPSY      Medical History: Past Medical History:  Diagnosis Date  . Anxiety   . Arthritis   . Depression   . Fibromyalgia   . GERD (gastroesophageal reflux disease)   . Glaucoma   . Hepatitis C   . Juvenile epilepsy ([place])   . Positive TB test   . Sleep apnea     Family History: Family History  Problem Relation Age of Onset  . Aneurysm Mother   . Cancer Father   . Mental illness Sister   . Drug abuse Paternal Aunt   . Drug abuse Maternal Uncle   . Mental illness Sister     Social History   Socioeconomic History  . Marital status: Married    Spouse name: Not on file  . Number of children: 1  . Years of education: Not on file  . Highest education level: Not on file  Occupational History  . Not on file  Social Needs  . Financial resource strain: Not on file  . Food insecurity    Worry: Not on file    Inability: Not on file  . Transportation needs    Medical: Not on file    Non-medical: Not on file  Tobacco Use  . Smoking status: Never Smoker  . Smokeless  tobacco: Never Used  Substance and Sexual Activity  . Alcohol use: Not Currently  . Drug use: Not Currently  . Sexual activity: Not on file  Lifestyle  . Physical activity    Days per week: Not on file    Minutes per session: Not on file  . Stress: Not on file  Relationships  . Social Musician on phone: Not on file    Gets together: Not on file    Attends religious service: Not on file    Active member of club or organization: Not on file    Attends meetings of clubs or organizations: Not on file    Relationship status: Not on file  . Intimate partner violence    Fear of current or ex partner: Not on file    Emotionally abused: Not on file    Physically abused: Not on file    Forced sexual activity: Not on file  Other Topics Concern  . Not on file  Social History Narrative  . Not on  file      Review of Systems  Constitutional: Negative.  Negative for chills, fatigue and unexpected weight change.  HENT: Negative.  Negative for congestion, rhinorrhea, sneezing and sore throat.   Eyes: Negative for redness.  Respiratory: Negative.  Negative for cough, chest tightness and shortness of breath.   Cardiovascular: Negative.  Negative for chest pain and palpitations.  Gastrointestinal: Negative.  Negative for abdominal pain, constipation, diarrhea, nausea and vomiting.  Endocrine: Negative.   Genitourinary: Negative.  Negative for dysuria and frequency.  Musculoskeletal: Negative.  Negative for arthralgias, back pain, joint swelling and neck pain.  Skin: Negative.  Negative for rash.  Allergic/Immunologic: Negative.   Neurological: Negative.  Negative for tremors and numbness.  Hematological: Negative for adenopathy. Does not bruise/bleed easily.  Psychiatric/Behavioral: Negative.  Negative for behavioral problems, sleep disturbance and suicidal ideas. The patient is not nervous/anxious.     Vital Signs: BP (!) 143/66   Pulse 77   Temp (!) 97.4 F (36.3 C)   Resp 16   Ht 6\' 1"  (1.854 m)   Wt (!) 390 lb (176.9 kg)   SpO2 95%   BMI 51.45 kg/m    Physical Exam Vitals signs and nursing note reviewed.  Constitutional:      General: He is not in acute distress.    Appearance: He is well-developed. He is not diaphoretic.  HENT:     Head: Normocephalic and atraumatic.     Mouth/Throat:     Pharynx: No oropharyngeal exudate.  Eyes:     Pupils: Pupils are equal, round, and reactive to light.  Neck:     Musculoskeletal: Normal range of motion and neck supple.     Thyroid: No thyromegaly.     Vascular: No JVD.     Trachea: No tracheal deviation.  Cardiovascular:     Rate and Rhythm: Normal rate and regular rhythm.     Heart sounds: Normal heart sounds. No murmur. No friction rub. No gallop.   Pulmonary:     Effort: Pulmonary effort is normal. No respiratory  distress.     Breath sounds: Normal breath sounds. No wheezing or rales.  Chest:     Chest wall: No tenderness.  Abdominal:     Palpations: Abdomen is soft.     Tenderness: There is no abdominal tenderness. There is no guarding.  Musculoskeletal: Normal range of motion.  Lymphadenopathy:     Cervical: No cervical adenopathy.  Skin:    General: Skin is warm and dry.  Neurological:     Mental Status: He is alert and oriented to person, place, and time.     Cranial Nerves: No cranial nerve deficit.  Psychiatric:        Behavior: Behavior normal.        Thought Content: Thought content normal.        Judgment: Judgment normal.     Assessment/Plan: 1. Rash Pt to see dermatology for possible eczema/ psoriasics. We did discuss using ointment, and keeping areas moist with lotion, multiple times daily.  He verbalized understanding.  - Ambulatory referral to Dermatology  2. Boil Resolved, healed well.    3. Elevated BP without diagnosis of hypertension Stable, continue with current management.  Discussed weight loss.   4. Gastroesophageal reflux disease without esophagitis Well controlled using as needed medicine.  5. Morbid obesity (HCC) We discussed weight loss in clinic using phentermine.  HE will work to change his diet and add exercise to his routine over the next 8 weeks.  If he loses weight for follow up, we will start with phentermine on a month to month basis. Obesity Counseling: Risk Assessment: An assessment of behavioral risk factors was made today and includes lack of exercise sedentary lifestyle, lack of portion control and poor dietary habits.  Risk Modification Advice: She was counseled on portion control guidelines. Restricting daily caloric intake to. . The detrimental long term effects of obesity on her health and ongoing poor compliance was also discussed with the patient.    General Counseling: Lidia CollumAllen verbalizes understanding of the findings of todays visit and  agrees with plan of treatment. I have discussed any further diagnostic evaluation that may be needed or ordered today. We also reviewed his medications today. he has been encouraged to call the office with any questions or concerns that should arise related to todays visit.    Orders Placed This Encounter  Procedures  . Ambulatory referral to Dermatology    No orders of the defined types were placed in this encounter.   Time spent: 25 Minutes   This patient was seen by Blima LedgerAdam Jazell Rosenau AGNP-C in Collaboration with Dr Lyndon CodeFozia M Khan as a part of collaborative care agreement     Johnna AcostaAdam J. Trejuan Matherne AGNP-C Internal medicine

## 2019-01-04 ENCOUNTER — Other Ambulatory Visit: Payer: Self-pay | Admitting: Psychiatry

## 2019-01-04 DIAGNOSIS — F331 Major depressive disorder, recurrent, moderate: Secondary | ICD-10-CM

## 2019-01-19 ENCOUNTER — Other Ambulatory Visit: Payer: Self-pay

## 2019-01-19 ENCOUNTER — Ambulatory Visit (INDEPENDENT_AMBULATORY_CARE_PROVIDER_SITE_OTHER): Payer: Medicare Other | Admitting: Psychiatry

## 2019-01-19 DIAGNOSIS — Z5329 Procedure and treatment not carried out because of patient's decision for other reasons: Secondary | ICD-10-CM

## 2019-01-19 NOTE — Progress Notes (Signed)
No response to call or text. 

## 2019-02-01 ENCOUNTER — Other Ambulatory Visit: Payer: Self-pay

## 2019-02-01 DIAGNOSIS — R21 Rash and other nonspecific skin eruption: Secondary | ICD-10-CM

## 2019-02-01 MED ORDER — EUCRISA 2 % EX OINT: 1.0000 "application " | TOPICAL_OINTMENT | Freq: Two times a day (BID) | CUTANEOUS | 0 refills | Status: DC | PRN

## 2019-02-26 ENCOUNTER — Other Ambulatory Visit: Payer: Self-pay | Admitting: Adult Health

## 2019-02-26 DIAGNOSIS — K219 Gastro-esophageal reflux disease without esophagitis: Secondary | ICD-10-CM

## 2019-03-07 ENCOUNTER — Telehealth: Payer: Self-pay

## 2019-03-07 NOTE — Telephone Encounter (Signed)
Rescheduled patients appointment on 03/09/2019 to 03/23/2019, patient was in contact with someone covid positive. klh

## 2019-03-09 ENCOUNTER — Ambulatory Visit: Payer: Medicare Other | Admitting: Adult Health

## 2019-03-11 ENCOUNTER — Other Ambulatory Visit: Payer: Self-pay

## 2019-03-11 MED ORDER — AZITHROMYCIN 250 MG PO TABS: ORAL_TABLET | ORAL | 0 refills | Status: DC

## 2019-03-11 NOTE — Telephone Encounter (Signed)
Pt wife called that he is positive for covid today he is having little short of breath,chills ,fever  And no coughing as per adam advised send zpak, take tylenlol and drink plenty of water and also symptoms worse or shortness of breath and fever not going down he need to go to ED and also call us back

## 2019-03-21 ENCOUNTER — Telehealth: Payer: Self-pay

## 2019-03-21 NOTE — Telephone Encounter (Signed)
Called lmom informing patient of virtual visit. klh 

## 2019-03-22 ENCOUNTER — Telehealth: Payer: Self-pay

## 2019-03-22 NOTE — Telephone Encounter (Signed)
Confirmed virtual visit with patient. klh 

## 2019-03-23 ENCOUNTER — Ambulatory Visit: Payer: Medicare Other | Admitting: Adult Health

## 2019-03-25 ENCOUNTER — Telehealth: Payer: Self-pay

## 2019-03-25 NOTE — Telephone Encounter (Signed)
Confirmed virtual visit on 03/29/2019. klh 

## 2019-03-29 ENCOUNTER — Telehealth: Payer: Self-pay

## 2019-03-29 ENCOUNTER — Ambulatory Visit (INDEPENDENT_AMBULATORY_CARE_PROVIDER_SITE_OTHER): Payer: Medicare Other | Admitting: Internal Medicine

## 2019-03-29 ENCOUNTER — Encounter: Payer: Self-pay | Admitting: Internal Medicine

## 2019-03-29 VITALS — Ht 73.0 in | Wt 359.0 lb

## 2019-03-29 DIAGNOSIS — G4733 Obstructive sleep apnea (adult) (pediatric): Secondary | ICD-10-CM | POA: Diagnosis not present

## 2019-03-29 DIAGNOSIS — K219 Gastro-esophageal reflux disease without esophagitis: Secondary | ICD-10-CM

## 2019-03-29 NOTE — Patient Instructions (Signed)

## 2019-03-29 NOTE — Telephone Encounter (Signed)
VERBAL ORDER SIGNED FOR USE OF HOME MEDICAL EQUIPMENT AND PLACED IN [employer].

## 2019-03-29 NOTE — Progress Notes (Signed)
[facility] [address] [place], [place] [address]  Internal MEDICINE  Telephone Visit  Patient Name: [name] [name]  [id number]  [id number]  Date of Service: 03/29/2019  I connected with the patient at 315 by video call and verified the patients identity using two identifiers.   I discussed the limitations, risks, security and privacy concerns of performing an evaluation and management service by telephone and the availability of in person appointments. I also discussed with the patient that there may be a patient responsible charge related to the service.  The patient expressed understanding and agrees to proceed.    Chief Complaint  Patient presents with  . Telephone Assessment  . Telephone Screen  . Follow-up    cpap    HPI Patient being seen for follow-up of obstructive sleep apnea he states that he feels as though the CPAP pressure is not sufficient and he was thinking having it increased.  Currently states that he is on a pressure of 5-15 auto so I think that we just have room to increase it.  He uses a full facemask and he states that the Pap mask fit is adequate he has not had any issues with the mask itself.  He denies having any headaches no sinus drainage denies having any cough congestion no chest pain    Current Medication: Outpatient Encounter Medications as of 03/29/2019  Medication Sig  . ARIPiprazole (ABILIFY) 2 MG tablet TAKE 1 TABLET BY MOUTH DAILY WITH BREAKFAST  . azithromycin (ZITHROMAX) 250 MG tablet Use as directed for 5 days for URI  . busPIRone (BUSPAR) 15 MG tablet Take 1 tablet (15 mg total) by mouth 3 (three) times daily.  Lennox Solders (EUCRISA) 2 % OINT Apply 1 application topically 2 (two) times daily as needed.  . gabapentin (NEURONTIN) 600 MG tablet Take 600 mg by mouth 3 (three) times daily.  . hydrOXYzine (VISTARIL) 25 MG capsule Take 1 capsule (25 mg total) by mouth 2 (two) times daily as needed for anxiety. ONLY FOR SEVERE PANIC ATTACKS   . levothyroxine (SYNTHROID) 50 MCG tablet Take 1 tablet (50 mcg total) by mouth daily.  . NON FORMULARY cpap device  . omeprazole (PRILOSEC) 20 MG capsule TAKE 1 CAPSULE BY MOUTH EVERY DAY  . PARoxetine (PAXIL) 40 MG tablet Take 1 tablet (40 mg total) by mouth every morning.  . sulfamethoxazole-trimethoprim (BACTRIM DS) 800-160 MG tablet Take 1 tablet by mouth 2 (two) times daily.  . traZODone (DESYREL) 100 MG tablet Take 1.5 tablets (150 mg total) by mouth at bedtime.   No facility-administered encounter medications on file as of 03/29/2019.    Surgical History: Past Surgical History:  Procedure Laterality Date  . LIVER BIOPSY      Medical History: Past Medical History:  Diagnosis Date  . Anxiety   . Arthritis   . Depression   . Fibromyalgia   . GERD (gastroesophageal reflux disease)   . Glaucoma   . Hepatitis C   . Juvenile epilepsy (HCC)   . Positive TB test   . Sleep apnea     Family History: Family History  Problem Relation Age of Onset  . Aneurysm Mother   . Cancer Father   . Mental illness Sister   . Drug abuse Paternal Aunt   . Drug abuse Maternal Uncle   . Mental illness Sister     Social History   Socioeconomic History  . Marital status: Married    Spouse name: Not on file  .  Number of children: 1  . Years of education: Not on file  . Highest education level: Not on file  Occupational History  . Not on file  Tobacco Use  . Smoking status: Never Smoker  . Smokeless tobacco: Never Used  Substance and Sexual Activity  . Alcohol use: Not Currently  . Drug use: Not Currently  . Sexual activity: Not on file  Other Topics Concern  . Not on file  Social History Narrative  . Not on file   Social Determinants of Health   Financial Resource Strain:   . Difficulty of Paying Living Expenses: Not on file  Food Insecurity:   . Worried About Charity fundraiser in the Last Year: Not on file  . Ran Out of Food in the Last Year: Not on file   Transportation Needs:   . Lack of Transportation (Medical): Not on file  . Lack of Transportation (Non-Medical): Not on file  Physical Activity:   . Days of Exercise per Week: Not on file  . Minutes of Exercise per Session: Not on file  Stress:   . Feeling of Stress : Not on file  Social Connections:   . Frequency of Communication with Friends and Family: Not on file  . Frequency of Social Gatherings with Friends and Family: Not on file  . Attends Religious Services: Not on file  . Active Member of Clubs or Organizations: Not on file  . Attends Archivist Meetings: Not on file  . Marital Status: Not on file  Intimate Partner Violence:   . Fear of Current or Ex-Partner: Not on file  . Emotionally Abused: Not on file  . Physically Abused: Not on file  . Sexually Abused: Not on file      Review of Systems  No cough no congestion no chest pain no sinus pressure No nausea no vomiting no diarrhea  Vital Signs: Ht 6\' 1"  (1.854 m)   Wt (!) 359 lb (162.8 kg)   BMI 47.36 kg/m    Observation/Objective: Appears grossly intact no distress at this time facial expressions were normal speech was normal    Assessment/Plan: #1 obstructive sleep apnea he is using his CPAP device however he did needs to have the pressure adjusted we will change his pressures from 7 up to 17 on auto titrating settings.  If he does not do well with this then I have spoken to him about the possibility of doing BiPAP titration.  2.  Morbid obesity work on diet and exercise.  3.  GERD he says he takes the propofol for control we will continue with supportive care  General Counseling: Dreama Saa understanding of the findings of today's phone visit and agrees with plan of treatment. I have discussed any further diagnostic evaluation that may be needed or ordered today. We also reviewed his medications today. he has been encouraged to call the office with any questions or concerns that should  arise related to todays visit.    Orders Placed This Encounter  Procedures  . For home use only DME continuous positive airway pressure (CPAP)    No orders of the defined types were placed in this encounter.   Time spent: 15 minutes    Allyne Gee MD [facility] Pulmonary Medicine

## 2019-04-12 LAB — TSH+FREE T4
Free T4: 0.84 ng/dL (ref 0.82–1.77)
TSH: 1.04 u[IU]/mL (ref 0.450–4.500)

## 2019-04-12 NOTE — Progress Notes (Signed)
Hey. This guy sees you tomorrow.

## 2019-04-14 ENCOUNTER — Ambulatory Visit (INDEPENDENT_AMBULATORY_CARE_PROVIDER_SITE_OTHER): Payer: Medicare Other | Admitting: Adult Health

## 2019-04-14 ENCOUNTER — Encounter: Payer: Self-pay | Admitting: Adult Health

## 2019-04-14 ENCOUNTER — Other Ambulatory Visit: Payer: Self-pay

## 2019-04-14 VITALS — BP 140/70 | HR 79 | Temp 97.9°F | Resp 16 | Ht 73.0 in | Wt 369.0 lb

## 2019-04-14 DIAGNOSIS — R21 Rash and other nonspecific skin eruption: Secondary | ICD-10-CM

## 2019-04-14 DIAGNOSIS — G4733 Obstructive sleep apnea (adult) (pediatric): Secondary | ICD-10-CM

## 2019-04-14 DIAGNOSIS — E039 Hypothyroidism, unspecified: Secondary | ICD-10-CM

## 2019-04-14 DIAGNOSIS — K219 Gastro-esophageal reflux disease without esophagitis: Secondary | ICD-10-CM | POA: Diagnosis not present

## 2019-04-14 MED ORDER — PHENTERMINE HCL 37.5 MG PO CAPS: 37.5000 mg | ORAL_CAPSULE | ORAL | 0 refills | Status: DC

## 2019-04-14 NOTE — Progress Notes (Addendum)
[facility] [place], [place] [address]  Internal MEDICINE  Office Visit Note  Patient Name: [name] [name]  [id number]  [id number]  Date of Service: 04/15/2019  Chief Complaint  Patient presents with  . Follow-up    review labs, possible metabolic test     HPI  Pt is here to follow up on dermatology referral and weight loss.  He has lost over 20 pounds in the 8 weeks since I have seen him.  He is walking daily and is watching is diet. He is interested in trying phentermine still, because he says he has put on about 5 pounds that he had originally lost. He will need a metabolic test today. He was suppose to see dermatology, however he reports no one ever called him to set up the appt. His GERD and hypothyroid are controlled at this time.      Current Medication: Outpatient Encounter Medications as of 04/14/2019  Medication Sig  . ARIPiprazole (ABILIFY) 2 MG tablet TAKE 1 TABLET BY MOUTH DAILY WITH BREAKFAST  . busPIRone (BUSPAR) 15 MG tablet Take 1 tablet (15 mg total) by mouth 3 (three) times daily.  Stasia Cavalier (EUCRISA) 2 % OINT Apply 1 application topically 2 (two) times daily as needed.  . gabapentin (NEURONTIN) 600 MG tablet Take 600 mg by mouth 3 (three) times daily.  . hydrOXYzine (VISTARIL) 25 MG capsule Take 1 capsule (25 mg total) by mouth 2 (two) times daily as needed for anxiety. ONLY FOR SEVERE PANIC ATTACKS  . levothyroxine (SYNTHROID) 50 MCG tablet Take 1 tablet (50 mcg total) by mouth daily.  . NON FORMULARY cpap device  . omeprazole (PRILOSEC) 20 MG capsule TAKE 1 CAPSULE BY MOUTH EVERY DAY  . PARoxetine (PAXIL) 40 MG tablet Take 1 tablet (40 mg total) by mouth every morning.  . sulfamethoxazole-trimethoprim (BACTRIM DS) 800-160 MG tablet Take 1 tablet by mouth 2 (two) times daily.  . traZODone (DESYREL) 100 MG tablet Take 1.5 tablets (150 mg total) by mouth at bedtime.  . phentermine 37.5 MG capsule Take 1 capsule (37.5 mg total) by mouth  every morning.  . [DISCONTINUED] azithromycin (ZITHROMAX) 250 MG tablet Use as directed for 5 days for URI (Patient not taking: Reported on 04/14/2019)   No facility-administered encounter medications on file as of 04/14/2019.    Surgical History: Past Surgical History:  Procedure Laterality Date  . LIVER BIOPSY      Medical History: Past Medical History:  Diagnosis Date  . Anxiety   . Arthritis   . Depression   . Fibromyalgia   . GERD (gastroesophageal reflux disease)   . Glaucoma   . Hepatitis C   . Juvenile epilepsy ([place])   . Positive TB test   . Sleep apnea     Family History: Family History  Problem Relation Age of Onset  . Aneurysm Mother   . Cancer Father   . Mental illness Sister   . Drug abuse Paternal Aunt   . Drug abuse Maternal Uncle   . Mental illness Sister     Social History   Socioeconomic History  . Marital status: Married    Spouse name: Not on file  . Number of children: 1  . Years of education: Not on file  . Highest education level: Not on file  Occupational History  . Not on file  Tobacco Use  . Smoking status: Never Smoker  . Smokeless tobacco: Never Used  Substance and Sexual Activity  . Alcohol  use: Not Currently  . Drug use: Not Currently  . Sexual activity: Not on file  Other Topics Concern  . Not on file  Social History Narrative  . Not on file   Social Determinants of Health   Financial Resource Strain:   . Difficulty of Paying Living Expenses: Not on file  Food Insecurity:   . Worried About Programme researcher, broadcasting/film/video in the Last Year: Not on file  . Ran Out of Food in the Last Year: Not on file  Transportation Needs:   . Lack of Transportation (Medical): Not on file  . Lack of Transportation (Non-Medical): Not on file  Physical Activity:   . Days of Exercise per Week: Not on file  . Minutes of Exercise per Session: Not on file  Stress:   . Feeling of Stress : Not on file  Social Connections:   . Frequency of  Communication with Friends and Family: Not on file  . Frequency of Social Gatherings with Friends and Family: Not on file  . Attends Religious Services: Not on file  . Active Member of Clubs or Organizations: Not on file  . Attends Banker Meetings: Not on file  . Marital Status: Not on file  Intimate Partner Violence:   . Fear of Current or Ex-Partner: Not on file  . Emotionally Abused: Not on file  . Physically Abused: Not on file  . Sexually Abused: Not on file      Review of Systems  Constitutional: Negative.  Negative for chills, fatigue and unexpected weight change.  HENT: Negative.  Negative for congestion, rhinorrhea, sneezing and sore throat.   Eyes: Negative for redness.  Respiratory: Negative.  Negative for cough, chest tightness and shortness of breath.   Cardiovascular: Negative.  Negative for chest pain and palpitations.  Gastrointestinal: Negative.  Negative for abdominal pain, constipation, diarrhea, nausea and vomiting.  Endocrine: Negative.   Genitourinary: Negative.  Negative for dysuria and frequency.  Musculoskeletal: Negative.  Negative for arthralgias, back pain, joint swelling and neck pain.  Skin: Negative.  Negative for rash.  Allergic/Immunologic: Negative.   Neurological: Negative.  Negative for tremors and numbness.  Hematological: Negative for adenopathy. Does not bruise/bleed easily.  Psychiatric/Behavioral: Negative.  Negative for behavioral problems, sleep disturbance and suicidal ideas. The patient is not nervous/anxious.     Vital Signs: BP 140/70   Pulse 79   Temp 97.9 F (36.6 C)   Resp 16   Ht 6\' 1"  (1.854 m)   Wt (!) 369 lb (167.4 kg)   SpO2 96%   BMI 48.68 kg/m    Physical Exam Vitals and nursing note reviewed.  Constitutional:      General: He is not in acute distress.    Appearance: He is well-developed. He is not diaphoretic.  HENT:     Head: Normocephalic and atraumatic.     Mouth/Throat:     Pharynx: No  oropharyngeal exudate.  Eyes:     Pupils: Pupils are equal, round, and reactive to light.  Neck:     Thyroid: No thyromegaly.     Vascular: No JVD.     Trachea: No tracheal deviation.  Cardiovascular:     Rate and Rhythm: Normal rate and regular rhythm.     Heart sounds: Normal heart sounds. No murmur. No friction rub. No gallop.   Pulmonary:     Effort: Pulmonary effort is normal. No respiratory distress.     Breath sounds: Normal breath sounds. No wheezing or rales.  Chest:     Chest wall: No tenderness.  Abdominal:     Palpations: Abdomen is soft.     Tenderness: There is no abdominal tenderness. There is no guarding.  Musculoskeletal:        General: Normal range of motion.     Cervical back: Normal range of motion and neck supple.  Lymphadenopathy:     Cervical: No cervical adenopathy.  Skin:    General: Skin is warm and dry.  Neurological:     Mental Status: He is alert and oriented to person, place, and time.     Cranial Nerves: No cranial nerve deficit.  Psychiatric:        Behavior: Behavior normal.        Thought Content: Thought content normal.        Judgment: Judgment normal.    Assessment/Plan: 1. Hypothyroidism, unspecified type Stable, continue current synthroid dose  2. Morbid obesity (HCC) PT has lost over 20 pounds since last visit.  He is encouraged to continue to exercise, and watch his diet.  Use phentermine as discussed. Discontinue use if chest pain, palpitations or other symptoms  Obesity Counseling: Risk Assessment: An assessment of behavioral risk factors was made today and includes lack of exercise sedentary lifestyle, lack of portion control and poor dietary habits.  Risk Modification Advice: She was counseled on portion control guidelines. Restricting daily caloric intake to 2000. The detrimental long term effects of obesity on her health and ongoing poor compliance was also discussed with the patient.  There is a liability release in  patients' chart. There has been a 10 minute discussion about the side effects including but not limited to elevated blood pressure, anxiety, lack of sleep and dry mouth. Pt understands and will like to start/continue on appetite suppressant at this time. There will be one month RX given at the time of visit with proper follow up. Nova diet plan with restricted calories is given to the pt. Pt understands and agrees with  plan of treatment - Metabolic Test -Phentermine 37.5mg  daily   3. OSA (obstructive sleep apnea) Continue to use cpap as discussed.  4. Gastroesophageal reflux disease without esophagitis Controlled, continue present mgmt.  5. Rash - Ambulatory referral to Dermatology  General Counseling: Lidia Collum understanding of the findings of todays visit and agrees with plan of treatment. I have discussed any further diagnostic evaluation that may be needed or ordered today. We also reviewed his medications today. he has been encouraged to call the office with any questions or concerns that should arise related to todays visit.    Orders Placed This Encounter  Procedures  . Metabolic Test  . Ambulatory referral to Dermatology    Meds ordered this encounter  Medications  . phentermine 37.5 MG capsule    Sig: Take 1 capsule (37.5 mg total) by mouth every morning.    Dispense:  30 capsule    Refill:  0    Time spent: 25 Minutes   This patient was seen by Blima Ledger AGNP-C in Collaboration with Dr Lyndon Code as a part of collaborative care agreement     Johnna Acosta AGNP-C Internal medicine

## 2019-04-20 ENCOUNTER — Ambulatory Visit: Payer: Medicare Other | Admitting: Adult Health

## 2019-05-10 ENCOUNTER — Telehealth: Payer: Self-pay

## 2019-05-10 NOTE — Telephone Encounter (Signed)
Confirmed appointment on 05/12/2019 and screened for covid. klh  °

## 2019-05-10 NOTE — Telephone Encounter (Signed)
Called lmom informing patient of appointment on 05/12/2019. klh 

## 2019-05-12 ENCOUNTER — Encounter: Payer: Self-pay | Admitting: Adult Health

## 2019-05-12 ENCOUNTER — Ambulatory Visit (INDEPENDENT_AMBULATORY_CARE_PROVIDER_SITE_OTHER): Payer: Medicare Other | Admitting: Adult Health

## 2019-05-12 ENCOUNTER — Other Ambulatory Visit: Payer: Self-pay

## 2019-05-12 VITALS — BP 143/73 | HR 83 | Temp 97.8°F | Resp 16 | Ht 73.0 in | Wt 365.0 lb

## 2019-05-12 DIAGNOSIS — F339 Major depressive disorder, recurrent, unspecified: Secondary | ICD-10-CM

## 2019-05-12 DIAGNOSIS — K219 Gastro-esophageal reflux disease without esophagitis: Secondary | ICD-10-CM | POA: Diagnosis not present

## 2019-05-12 DIAGNOSIS — Z6841 Body Mass Index (BMI) 40.0 and over, adult: Secondary | ICD-10-CM

## 2019-05-12 MED ORDER — PHENTERMINE HCL 37.5 MG PO CAPS: 37.5000 mg | ORAL_CAPSULE | ORAL | 0 refills | Status: DC

## 2019-05-12 NOTE — Progress Notes (Signed)
[facility] [address] [place], [place] [address]  Internal MEDICINE  Office Visit Note  Patient Name: [name] [name]  [id number]  [id number]  Date of Service: 05/12/2019  Chief Complaint  Patient presents with  . Follow-up    4 week follow up weight loss  . Depression  . Gastroesophageal Reflux  . Sleep Apnea    HPI  Pt is here for follow up on depression , GERD and weight loss.  He reports his GERD and depression are well controlled. Patient is using phentermine for weight loss.  Since our last visit they have lost 4 pounds.  The patient denies any chest pain, palpitations, shortness of breath, constipation, headaches or any other side effects of the medication.  Patient wishes to continue to use this medication for weight loss at this time. He continues to walk, and watch what he is eating.     Current Medication: Outpatient Encounter Medications as of 05/12/2019  Medication Sig  . ARIPiprazole (ABILIFY) 2 MG tablet TAKE 1 TABLET BY MOUTH DAILY WITH BREAKFAST  . busPIRone (BUSPAR) 15 MG tablet Take 1 tablet (15 mg total) by mouth 3 (three) times daily.  Lennox Solders (EUCRISA) 2 % OINT Apply 1 application topically 2 (two) times daily as needed.  . gabapentin (NEURONTIN) 600 MG tablet Take 600 mg by mouth 3 (three) times daily.  . hydrOXYzine (VISTARIL) 25 MG capsule Take 1 capsule (25 mg total) by mouth 2 (two) times daily as needed for anxiety. ONLY FOR SEVERE PANIC ATTACKS  . levothyroxine (SYNTHROID) 50 MCG tablet Take 1 tablet (50 mcg total) by mouth daily.  . NON FORMULARY cpap device  . omeprazole (PRILOSEC) 20 MG capsule TAKE 1 CAPSULE BY MOUTH EVERY DAY  . PARoxetine (PAXIL) 40 MG tablet Take 1 tablet (40 mg total) by mouth every morning.  . phentermine 37.5 MG capsule Take 1 capsule (37.5 mg total) by mouth every morning.  . sulfamethoxazole-trimethoprim (BACTRIM DS) 800-160 MG tablet Take 1 tablet by mouth 2 (two) times daily.  . traZODone (DESYREL) 100  MG tablet Take 1.5 tablets (150 mg total) by mouth at bedtime.   No facility-administered encounter medications on file as of 05/12/2019.    Surgical History: Past Surgical History:  Procedure Laterality Date  . LIVER BIOPSY      Medical History: Past Medical History:  Diagnosis Date  . Anxiety   . Arthritis   . Depression   . Fibromyalgia   . GERD (gastroesophageal reflux disease)   . Glaucoma   . Hepatitis C   . Juvenile epilepsy (HCC)   . Positive TB test   . Sleep apnea     Family History: Family History  Problem Relation Age of Onset  . Aneurysm Mother   . Cancer Father   . Mental illness Sister   . Drug abuse Paternal Aunt   . Drug abuse Maternal Uncle   . Mental illness Sister     Social History   Socioeconomic History  . Marital status: Married    Spouse name: Not on file  . Number of children: 1  . Years of education: Not on file  . Highest education level: Not on file  Occupational History  . Not on file  Tobacco Use  . Smoking status: Never Smoker  . Smokeless tobacco: Never Used  Substance and Sexual Activity  . Alcohol use: Not Currently  . Drug use: Not Currently  . Sexual activity: Not on file  Other Topics Concern  .  Not on file  Social History Narrative  . Not on file   Social Determinants of Health   Financial Resource Strain:   . Difficulty of Paying Living Expenses:   Food Insecurity:   . Worried About Charity fundraiser in the Last Year:   . Arboriculturist in the Last Year:   Transportation Needs:   . Film/video editor (Medical):   Marland Kitchen Lack of Transportation (Non-Medical):   Physical Activity:   . Days of Exercise per Week:   . Minutes of Exercise per Session:   Stress:   . Feeling of Stress :   Social Connections:   . Frequency of Communication with Friends and Family:   . Frequency of Social Gatherings with Friends and Family:   . Attends Religious Services:   . Active Member of Clubs or Organizations:   .  Attends Archivist Meetings:   Marland Kitchen Marital Status:   Intimate Partner Violence:   . Fear of Current or Ex-Partner:   . Emotionally Abused:   Marland Kitchen Physically Abused:   . Sexually Abused:       Review of Systems  Constitutional: Negative.  Negative for chills, fatigue and unexpected weight change.  HENT: Negative.  Negative for congestion, rhinorrhea, sneezing and sore throat.   Eyes: Negative for redness.  Respiratory: Negative.  Negative for cough, chest tightness and shortness of breath.   Cardiovascular: Negative.  Negative for chest pain and palpitations.  Gastrointestinal: Negative.  Negative for abdominal pain, constipation, diarrhea, nausea and vomiting.  Endocrine: Negative.   Genitourinary: Negative.  Negative for dysuria and frequency.  Musculoskeletal: Negative.  Negative for arthralgias, back pain, joint swelling and neck pain.  Skin: Negative.  Negative for rash.  Allergic/Immunologic: Negative.   Neurological: Negative.  Negative for tremors and numbness.  Hematological: Negative for adenopathy. Does not bruise/bleed easily.  Psychiatric/Behavioral: Negative.  Negative for behavioral problems, sleep disturbance and suicidal ideas. The patient is not nervous/anxious.     Vital Signs: BP (!) 143/73   Pulse 83   Temp 97.8 F (36.6 C)   Resp 16   Ht 6\' 1"  (1.854 m)   Wt (!) 365 lb (165.6 kg)   SpO2 96%   BMI 48.16 kg/m    Physical Exam Vitals and nursing note reviewed.  Constitutional:      General: He is not in acute distress.    Appearance: He is well-developed. He is not diaphoretic.  HENT:     Head: Normocephalic and atraumatic.     Mouth/Throat:     Pharynx: No oropharyngeal exudate.  Eyes:     Pupils: Pupils are equal, round, and reactive to light.  Neck:     Thyroid: No thyromegaly.     Vascular: No JVD.     Trachea: No tracheal deviation.  Cardiovascular:     Rate and Rhythm: Normal rate and regular rhythm.     Heart sounds: Normal heart  sounds. No murmur. No friction rub. No gallop.   Pulmonary:     Effort: Pulmonary effort is normal. No respiratory distress.     Breath sounds: Normal breath sounds. No wheezing or rales.  Chest:     Chest wall: No tenderness.  Abdominal:     Palpations: Abdomen is soft.     Tenderness: There is no abdominal tenderness. There is no guarding.  Musculoskeletal:        General: Normal range of motion.     Cervical back: Normal range of  motion and neck supple.  Lymphadenopathy:     Cervical: No cervical adenopathy.  Skin:    General: Skin is warm and dry.  Neurological:     Mental Status: He is alert and oriented to person, place, and time.     Cranial Nerves: No cranial nerve deficit.  Psychiatric:        Behavior: Behavior normal.        Thought Content: Thought content normal.        Judgment: Judgment normal.    Assessment/Plan: 1. Depression, recurrent (HCC) Overall well controlled, continue current mgmt  2. Gastroesophageal reflux disease without esophagitis Stable, continue present management.   3. BMI 45.0-49.9, adult (HCC) Obesity Counseling: Risk Assessment: An assessment of behavioral risk factors was made today and includes lack of exercise sedentary lifestyle, lack of portion control and poor dietary habits.  Risk Modification Advice: She was counseled on portion control guidelines. Restricting daily caloric intake to 2000. The detrimental long term effects of obesity on her health and ongoing poor compliance was also discussed with the patient.  - phentermine 37.5 MG capsule; Take 1 capsule (37.5 mg total) by mouth every morning.  Dispense: 30 capsule; Refill: 0  4. Morbid obesity (HCC) Continue to exercise and make good dietary choices  General Counseling: Montrell verbalizes understanding of the findings of todays visit and agrees with plan of treatment. I have discussed any further diagnostic evaluation that may be needed or ordered today. We also reviewed his  medications today. he has been encouraged to call the office with any questions or concerns that should arise related to todays visit.    No orders of the defined types were placed in this encounter.   No orders of the defined types were placed in this encounter.   Time spent: 25 Minutes   This patient was seen by Blima Ledger AGNP-C in Collaboration with Dr Lyndon Code as a part of collaborative care agreement     Johnna Acosta AGNP-C Internal medicine

## 2019-05-18 ENCOUNTER — Other Ambulatory Visit: Payer: Self-pay

## 2019-05-18 DIAGNOSIS — M549 Dorsalgia, unspecified: Secondary | ICD-10-CM

## 2019-05-18 MED ORDER — NAPROXEN 500 MG PO TABS: 500.0000 mg | ORAL_TABLET | Freq: Two times a day (BID) | ORAL | 1 refills | Status: DC | PRN

## 2019-06-01 ENCOUNTER — Telehealth: Payer: Self-pay

## 2019-06-01 NOTE — Telephone Encounter (Signed)
VERBAL ORDER SIGNED FOR HOME MEDICAL EQUIPMENT AND PLACED IN [employer] PATIENT FOLDER.

## 2019-06-09 ENCOUNTER — Ambulatory Visit: Payer: Medicare Other | Admitting: Adult Health

## 2019-06-13 ENCOUNTER — Telehealth: Payer: Self-pay

## 2019-06-13 NOTE — Telephone Encounter (Signed)
Billed missed appt fee for 06/08/19.

## 2019-06-14 ENCOUNTER — Encounter: Payer: Self-pay | Admitting: Adult Health

## 2019-06-14 ENCOUNTER — Other Ambulatory Visit: Payer: Self-pay

## 2019-06-14 ENCOUNTER — Ambulatory Visit (INDEPENDENT_AMBULATORY_CARE_PROVIDER_SITE_OTHER): Payer: Medicare Other | Admitting: Adult Health

## 2019-06-14 VITALS — BP 145/78 | HR 75 | Temp 97.9°F | Resp 16 | Ht 73.0 in | Wt 370.0 lb

## 2019-06-14 DIAGNOSIS — Z6841 Body Mass Index (BMI) 40.0 and over, adult: Secondary | ICD-10-CM

## 2019-06-14 DIAGNOSIS — K219 Gastro-esophageal reflux disease without esophagitis: Secondary | ICD-10-CM

## 2019-06-14 DIAGNOSIS — F339 Major depressive disorder, recurrent, unspecified: Secondary | ICD-10-CM | POA: Diagnosis not present

## 2019-06-14 DIAGNOSIS — F419 Anxiety disorder, unspecified: Secondary | ICD-10-CM

## 2019-06-14 DIAGNOSIS — E039 Hypothyroidism, unspecified: Secondary | ICD-10-CM

## 2019-06-14 MED ORDER — BUSPIRONE HCL 7.5 MG PO TABS: 7.5000 mg | ORAL_TABLET | Freq: Two times a day (BID) | ORAL | 0 refills | Status: DC

## 2019-06-14 NOTE — Progress Notes (Signed)
[facility] [address] [place], [place] [address]  Internal MEDICINE  Office Visit Note  Patient Name: [name] [name]  [id number]  [id number]  Date of Service: 06/14/2019  Chief Complaint  Patient presents with  . Follow-up    weightloss  . Depression  . Gastroesophageal Reflux    HPI  Pt is here for follow up on Depression, GERD.  He reports his depression has been worse since his uncle in law died suddenly. Her GERD is controlled with Omeprazole as needed. He had been using phentermine for weight loss, however he feels it is not working anymore. He has not been exercising much at this time.    Current Medication: Outpatient Encounter Medications as of 06/14/2019  Medication Sig  . ARIPiprazole (ABILIFY) 2 MG tablet TAKE 1 TABLET BY MOUTH DAILY WITH BREAKFAST  . Crisaborole (EUCRISA) 2 % OINT Apply 1 application topically 2 (two) times daily as needed.  Marland Kitchen levothyroxine (SYNTHROID) 50 MCG tablet Take 1 tablet (50 mcg total) by mouth daily.  . naproxen (NAPROSYN) 500 MG tablet Take 1 tablet (500 mg total) by mouth 2 (two) times daily as needed.  . NON FORMULARY cpap device  . omeprazole (PRILOSEC) 20 MG capsule TAKE 1 CAPSULE BY MOUTH EVERY DAY  . [DISCONTINUED] phentermine 37.5 MG capsule Take 1 capsule (37.5 mg total) by mouth every morning.  . busPIRone (BUSPAR) 7.5 MG tablet Take 1 tablet (7.5 mg total) by mouth 2 (two) times daily.  . [DISCONTINUED] busPIRone (BUSPAR) 15 MG tablet Take 1 tablet (15 mg total) by mouth 3 (three) times daily.  . [DISCONTINUED] gabapentin (NEURONTIN) 600 MG tablet Take 600 mg by mouth 3 (three) times daily.  . [DISCONTINUED] hydrOXYzine (VISTARIL) 25 MG capsule Take 1 capsule (25 mg total) by mouth 2 (two) times daily as needed for anxiety. ONLY FOR SEVERE PANIC ATTACKS  . [DISCONTINUED] PARoxetine (PAXIL) 40 MG tablet Take 1 tablet (40 mg total) by mouth every morning.  . [DISCONTINUED] sulfamethoxazole-trimethoprim (BACTRIM DS)  800-160 MG tablet Take 1 tablet by mouth 2 (two) times daily.  . [DISCONTINUED] traZODone (DESYREL) 100 MG tablet Take 1.5 tablets (150 mg total) by mouth at bedtime.   No facility-administered encounter medications on file as of 06/14/2019.    Surgical History: Past Surgical History:  Procedure Laterality Date  . LIVER BIOPSY      Medical History: Past Medical History:  Diagnosis Date  . Anxiety   . Arthritis   . Depression   . Fibromyalgia   . GERD (gastroesophageal reflux disease)   . Glaucoma   . Hepatitis C   . Juvenile epilepsy (HCC)   . Positive TB test   . Sleep apnea     Family History: Family History  Problem Relation Age of Onset  . Aneurysm Mother   . Cancer Father   . Mental illness Sister   . Drug abuse Paternal Aunt   . Drug abuse Maternal Uncle   . Mental illness Sister     Social History   Socioeconomic History  . Marital status: Married    Spouse name: Not on file  . Number of children: 1  . Years of education: Not on file  . Highest education level: Not on file  Occupational History  . Not on file  Tobacco Use  . Smoking status: Never Smoker  . Smokeless tobacco: Never Used  Substance and Sexual Activity  . Alcohol use: Not Currently  . Drug use: Not Currently  . Sexual activity:  Not on file  Other Topics Concern  . Not on file  Social History Narrative  . Not on file   Social Determinants of Health   Financial Resource Strain:   . Difficulty of Paying Living Expenses:   Food Insecurity:   . Worried About Charity fundraiser in the Last Year:   . Arboriculturist in the Last Year:   Transportation Needs:   . Film/video editor (Medical):   Marland Kitchen Lack of Transportation (Non-Medical):   Physical Activity:   . Days of Exercise per Week:   . Minutes of Exercise per Session:   Stress:   . Feeling of Stress :   Social Connections:   . Frequency of Communication with Friends and Family:   . Frequency of Social Gatherings with  Friends and Family:   . Attends Religious Services:   . Active Member of Clubs or Organizations:   . Attends Archivist Meetings:   Marland Kitchen Marital Status:   Intimate Partner Violence:   . Fear of Current or Ex-Partner:   . Emotionally Abused:   Marland Kitchen Physically Abused:   . Sexually Abused:       Review of Systems  Constitutional: Negative.  Negative for chills, fatigue and unexpected weight change.  HENT: Negative.  Negative for congestion, rhinorrhea, sneezing and sore throat.   Eyes: Negative for redness.  Respiratory: Negative.  Negative for cough, chest tightness and shortness of breath.   Cardiovascular: Negative.  Negative for chest pain and palpitations.  Gastrointestinal: Negative.  Negative for abdominal pain, constipation, diarrhea, nausea and vomiting.  Endocrine: Negative.   Genitourinary: Negative.  Negative for dysuria and frequency.  Musculoskeletal: Negative.  Negative for arthralgias, back pain, joint swelling and neck pain.  Skin: Negative.  Negative for rash.  Allergic/Immunologic: Negative.   Neurological: Negative.  Negative for tremors and numbness.  Hematological: Negative for adenopathy. Does not bruise/bleed easily.  Psychiatric/Behavioral: Negative.  Negative for behavioral problems, sleep disturbance and suicidal ideas. The patient is not nervous/anxious.     Vital Signs: BP (!) 145/78   Pulse 75   Temp 97.9 F (36.6 C)   Resp 16   Ht 6\' 1"  (1.854 m)   Wt (!) 370 lb (167.8 kg)   SpO2 96%   BMI 48.82 kg/m    Physical Exam Vitals and nursing note reviewed.  Constitutional:      General: He is not in acute distress.    Appearance: He is well-developed. He is not diaphoretic.  HENT:     Head: Normocephalic and atraumatic.     Mouth/Throat:     Pharynx: No oropharyngeal exudate.  Eyes:     Pupils: Pupils are equal, round, and reactive to light.  Neck:     Thyroid: No thyromegaly.     Vascular: No JVD.     Trachea: No tracheal deviation.   Cardiovascular:     Rate and Rhythm: Normal rate and regular rhythm.     Heart sounds: Normal heart sounds. No murmur. No friction rub. No gallop.   Pulmonary:     Effort: Pulmonary effort is normal. No respiratory distress.     Breath sounds: Normal breath sounds. No wheezing or rales.  Chest:     Chest wall: No tenderness.  Abdominal:     Palpations: Abdomen is soft.     Tenderness: There is no abdominal tenderness. There is no guarding.  Musculoskeletal:        General: Normal range of motion.  Cervical back: Normal range of motion and neck supple.  Lymphadenopathy:     Cervical: No cervical adenopathy.  Skin:    General: Skin is warm and dry.  Neurological:     Mental Status: He is alert and oriented to person, place, and time.     Cranial Nerves: No cranial nerve deficit.  Psychiatric:        Behavior: Behavior normal.        Thought Content: Thought content normal.        Judgment: Judgment normal.    Assessment/Plan: 1. Depression, recurrent (HCC) Restart Buspar at low dose, will increase in one month.  - busPIRone (BUSPAR) 7.5 MG tablet; Take 1 tablet (7.5 mg total) by mouth 2 (two) times daily.  Dispense: 60 tablet; Refill: 0  2. Anxiety disorder, unspecified type Start Buspar as discussed and continue to monitor anxiety.   3. Gastroesophageal reflux disease without esophagitis Stable, continue omeprazole  4. Hypothyroidism, unspecified type Controlled, continue current mgmt.  5. BMI 45.0-49.9, adult (HCC) Obesity Counseling: Risk Assessment: An assessment of behavioral risk factors was made today and includes lack of exercise sedentary lifestyle, lack of portion control and poor dietary habits.  Risk Modification Advice: She was counseled on portion control guidelines. Restricting daily caloric intake to 1800. The detrimental long term effects of obesity on her health and ongoing poor compliance was also discussed with the patient.  6. Morbid obesity  (HCC) BMI of 48.   General Counseling: Lidia Collum understanding of the findings of todays visit and agrees with plan of treatment. I have discussed any further diagnostic evaluation that may be needed or ordered today. We also reviewed his medications today. he has been encouraged to call the office with any questions or concerns that should arise related to todays visit.    No orders of the defined types were placed in this encounter.   Meds ordered this encounter  Medications  . busPIRone (BUSPAR) 7.5 MG tablet    Sig: Take 1 tablet (7.5 mg total) by mouth 2 (two) times daily.    Dispense:  60 tablet    Refill:  0    Time spent: 30 Minutes   This patient was seen by Blima Ledger AGNP-C in Collaboration with Dr Lyndon Code as a part of collaborative care agreement     Johnna Acosta AGNP-C Internal medicine

## 2019-06-16 ENCOUNTER — Telehealth: Payer: Self-pay

## 2019-06-16 NOTE — Telephone Encounter (Signed)
Faxed cologuard 

## 2019-07-07 ENCOUNTER — Other Ambulatory Visit: Payer: Self-pay

## 2019-07-07 DIAGNOSIS — F339 Major depressive disorder, recurrent, unspecified: Secondary | ICD-10-CM

## 2019-07-07 MED ORDER — BUSPIRONE HCL 7.5 MG PO TABS: 7.5000 mg | ORAL_TABLET | Freq: Two times a day (BID) | ORAL | 0 refills | Status: DC

## 2019-07-08 ENCOUNTER — Telehealth: Payer: Self-pay

## 2019-07-08 NOTE — Telephone Encounter (Signed)
Confirmed appointment on 07/12/2019 and screened for covid. [facility] 

## 2019-07-12 ENCOUNTER — Ambulatory Visit: Payer: Medicare Other | Admitting: Adult Health

## 2019-07-19 ENCOUNTER — Telehealth: Payer: Self-pay

## 2019-07-19 NOTE — Telephone Encounter (Signed)
Confirmed patient appt for 07/20/19 

## 2019-07-20 ENCOUNTER — Ambulatory Visit (INDEPENDENT_AMBULATORY_CARE_PROVIDER_SITE_OTHER): Payer: Medicare Other

## 2019-07-20 ENCOUNTER — Other Ambulatory Visit: Payer: Self-pay

## 2019-07-20 DIAGNOSIS — G4733 Obstructive sleep apnea (adult) (pediatric): Secondary | ICD-10-CM

## 2019-07-20 NOTE — Progress Notes (Signed)
95 percentile pressure 12.7   95th percentile leak 19.4   apnea index 0.5 /hr  apnea-hypopnea index  1.6 /hr   total days used  >4 hr 14 days  total days used <4 hr 0 days  Total compliance 100 percent  He doing great no problems or questions at this time

## 2019-08-02 ENCOUNTER — Other Ambulatory Visit: Payer: Self-pay

## 2019-08-02 DIAGNOSIS — M549 Dorsalgia, unspecified: Secondary | ICD-10-CM

## 2019-08-02 MED ORDER — NAPROXEN 500 MG PO TABS: 500.0000 mg | ORAL_TABLET | Freq: Two times a day (BID) | ORAL | 1 refills | Status: DC | PRN

## 2019-08-03 ENCOUNTER — Other Ambulatory Visit: Payer: Self-pay

## 2019-08-03 DIAGNOSIS — E039 Hypothyroidism, unspecified: Secondary | ICD-10-CM

## 2019-08-03 MED ORDER — LEVOTHYROXINE SODIUM 50 MCG PO TABS: 50.0000 ug | ORAL_TABLET | Freq: Every day | ORAL | 3 refills | Status: DC

## 2019-09-09 ENCOUNTER — Telehealth: Payer: Self-pay

## 2019-09-09 NOTE — Telephone Encounter (Signed)
Confirmed appointment on 09/13/2019 and screened for covid. [facility]  

## 2019-09-13 ENCOUNTER — Encounter: Payer: Self-pay | Admitting: Internal Medicine

## 2019-09-13 ENCOUNTER — Ambulatory Visit (INDEPENDENT_AMBULATORY_CARE_PROVIDER_SITE_OTHER): Payer: Medicare Other | Admitting: Internal Medicine

## 2019-09-13 ENCOUNTER — Other Ambulatory Visit: Payer: Self-pay

## 2019-09-13 VITALS — BP 120/70 | HR 88 | Temp 97.4°F | Resp 16 | Ht 73.0 in | Wt 363.2 lb

## 2019-09-13 DIAGNOSIS — G4733 Obstructive sleep apnea (adult) (pediatric): Secondary | ICD-10-CM | POA: Diagnosis not present

## 2019-09-13 DIAGNOSIS — Z6841 Body Mass Index (BMI) 40.0 and over, adult: Secondary | ICD-10-CM

## 2019-09-13 DIAGNOSIS — Z9989 Dependence on other enabling machines and devices: Secondary | ICD-10-CM

## 2019-09-13 DIAGNOSIS — K219 Gastro-esophageal reflux disease without esophagitis: Secondary | ICD-10-CM

## 2019-09-13 NOTE — Progress Notes (Signed)
[facility] [address] [place], [place] [address]  Pulmonary Sleep Medicine   Office Visit Note  Patient Name: [name] [name] DOB: [id number] MRN [id number]  Date of Service: 09/13/2019  Complaints/HPI: Pt is here for follow up on osa anxiety and GERD.  Overall he is doing well.  Pt reports good compliance with CPAP therapy. Cleaning machine by hand, and changing filters and tubing as directed. Denies headaches, sinus issues, palpitations, or hemoptysis.  He does report feeling like he needs more pressure on his machine because he feels like he isn't getting enough air.   ROS  General: (-) fever, (-) chills, (-) night sweats, (-) weakness Skin: (-) rashes, (-) itching,. Eyes: (-) visual changes, (-) redness, (-) itching. Nose and Sinuses: (-) nasal stuffiness or itchiness, (-) postnasal drip, (-) nosebleeds, (-) sinus trouble. Mouth and Throat: (-) sore throat, (-) hoarseness. Neck: (-) swollen glands, (-) enlarged thyroid, (-) neck pain. Respiratory: - cough, (-) bloody sputum, - shortness of breath, - wheezing. Cardiovascular: - ankle swelling, (-) chest pain. Lymphatic: (-) lymph node enlargement. Neurologic: (-) numbness, (-) tingling. Psychiatric: (-) anxiety, (-) depression   Current Medication: Outpatient Encounter Medications as of 09/13/2019  Medication Sig  . busPIRone (BUSPAR) 7.5 MG tablet Take 1 tablet (7.5 mg total) by mouth 2 (two) times daily.  Marland Kitchen levothyroxine (SYNTHROID) 50 MCG tablet Take 1 tablet (50 mcg total) by mouth daily.  . naproxen (NAPROSYN) 500 MG tablet Take 1 tablet (500 mg total) by mouth 2 (two) times daily as needed.  . NON FORMULARY cpap device  . omeprazole (PRILOSEC) 20 MG capsule TAKE 1 CAPSULE BY MOUTH EVERY DAY  . ARIPiprazole (ABILIFY) 2 MG tablet TAKE 1 TABLET BY MOUTH DAILY WITH BREAKFAST (Patient not taking: Reported on 09/13/2019)  . Crisaborole (EUCRISA) 2 % OINT Apply 1 application topically 2 (two) times daily as needed.  (Patient not taking: Reported on 09/13/2019)   No facility-administered encounter medications on file as of 09/13/2019.    Surgical History: Past Surgical History:  Procedure Laterality Date  . LIVER BIOPSY      Medical History: Past Medical History:  Diagnosis Date  . Anxiety   . Arthritis   . Depression   . Fibromyalgia   . GERD (gastroesophageal reflux disease)   . Glaucoma   . Hepatitis C   . Juvenile epilepsy (HCC)   . Positive TB test   . Sleep apnea     Family History: Family History  Problem Relation Age of Onset  . Aneurysm Mother   . Cancer Father   . Mental illness Sister   . Drug abuse Paternal Aunt   . Drug abuse Maternal Uncle   . Mental illness Sister     Social History: Social History   Socioeconomic History  . Marital status: Married    Spouse name: Not on file  . Number of children: 1  . Years of education: Not on file  . Highest education level: Not on file  Occupational History  . Not on file  Tobacco Use  . Smoking status: Never Smoker  . Smokeless tobacco: Never Used  Vaping Use  . Vaping Use: Never used  Substance and Sexual Activity  . Alcohol use: Not Currently  . Drug use: Not Currently  . Sexual activity: Not on file  Other Topics Concern  . Not on file  Social History Narrative  . Not on file   Social Determinants of Health   Financial Resource Strain:   .  Difficulty of Paying Living Expenses:   Food Insecurity:   . Worried About Programme researcher, broadcasting/film/video in the Last Year:   . Barista in the Last Year:   Transportation Needs:   . Freight forwarder (Medical):   Marland Kitchen Lack of Transportation (Non-Medical):   Physical Activity:   . Days of Exercise per Week:   . Minutes of Exercise per Session:   Stress:   . Feeling of Stress :   Social Connections:   . Frequency of Communication with Friends and Family:   . Frequency of Social Gatherings with Friends and Family:   . Attends Religious Services:   . Active Member  of Clubs or Organizations:   . Attends Banker Meetings:   Marland Kitchen Marital Status:   Intimate Partner Violence:   . Fear of Current or Ex-Partner:   . Emotionally Abused:   Marland Kitchen Physically Abused:   . Sexually Abused:     Vital Signs: Blood pressure 120/70, pulse 88, temperature (!) 97.4 F (36.3 C), resp. rate 16, height 6\' 1"  (1.854 m), weight (!) 363 lb 3.2 oz (164.7 kg), SpO2 98 %.  Examination: General Appearance: The patient is well-developed, well-nourished, and in no distress. Skin: Gross inspection of skin unremarkable. Head: normocephalic, no gross deformities. Eyes: no gross deformities noted. ENT: ears appear grossly normal no exudates. Neck: Supple. No thyromegaly. No LAD. Respiratory: clear bilaterally. Cardiovascular: Normal S1 and S2 without murmur or rub. Extremities: No cyanosis. pulses are equal. Neurologic: Alert and oriented. No involuntary movements.  LABS: No results found for this or any previous visit (from the past 2160 hour(s)).  Radiology: Patient was never admitted.  No results found.  No results found.    Assessment and Plan: Patient Active Problem List   Diagnosis Date Noted  . Encounter for general adult medical examination with abnormal findings 12/18/2018  . Hypothyroidism 12/18/2018  . Boil 12/18/2018  . Rash 12/18/2018  . Dysuria 12/18/2018  . PTSD (post-traumatic stress disorder) 10/05/2018  . MDD (major depressive disorder), recurrent episode, moderate (HCC) 10/05/2018    1. OSA on CPAP Excellent compliance.  Continue to use as prescribed.    2. Gastroesophageal reflux disease without esophagitis Continue omeprazole. No recent symptoms.   3. BMI 45.0-49.9, adult (HCC) Obesity Counseling: Risk Assessment: An assessment of behavioral risk factors was made today and includes lack of exercise sedentary lifestyle, lack of portion control and poor dietary habits.  Risk Modification Advice: She was counseled on portion  control guidelines. Restricting daily caloric intake to 1800. The detrimental long term effects of obesity on her health and ongoing poor compliance was also discussed with the patient.  4. Morbid obesity (HCC) bmi over 40  General Counseling: I have discussed the findings of the evaluation and examination with 07-13-1981.  I have also discussed any further diagnostic evaluation thatmay be needed or ordered today. Celia verbalizes understanding of the findings of todays visit. We also reviewed his medications today and discussed drug interactions and side effects including but not limited excessive drowsiness and altered mental states. We also discussed that there is always a risk not just to him but also people around him. he has been encouraged to call the office with any questions or concerns that should arise related to todays visit.  No orders of the defined types were placed in this encounter.    Time spent: 30 This patient was seen by [name] AGNP-C in Collaboration with Dr. Blima Ledger as  a part of collaborative care agreement.   I have personally obtained a history, examined the patient, evaluated laboratory and imaging results, formulated the assessment and plan and placed orders.    Yevonne Pax, MD [facility] Pulmonary and Critical Care Sleep medicine

## 2019-09-20 ENCOUNTER — Telehealth: Payer: Self-pay

## 2019-09-20 NOTE — Telephone Encounter (Signed)
Confirmed and screened for 09-21-19 ov. 

## 2019-09-21 ENCOUNTER — Ambulatory Visit: Payer: Medicare Other | Admitting: Adult Health

## 2019-09-26 ENCOUNTER — Other Ambulatory Visit: Payer: Self-pay | Admitting: Adult Health

## 2019-09-26 DIAGNOSIS — M549 Dorsalgia, unspecified: Secondary | ICD-10-CM

## 2019-09-27 ENCOUNTER — Ambulatory Visit: Payer: Medicare Other | Admitting: Internal Medicine

## 2019-09-28 ENCOUNTER — Ambulatory Visit (INDEPENDENT_AMBULATORY_CARE_PROVIDER_SITE_OTHER): Payer: Medicare Other | Admitting: Nurse Practitioner

## 2019-09-28 ENCOUNTER — Other Ambulatory Visit: Payer: Self-pay

## 2019-09-28 ENCOUNTER — Encounter: Payer: Self-pay | Admitting: Nurse Practitioner

## 2019-09-28 VITALS — BP 122/68 | HR 93 | Temp 98.1°F | Resp 16 | Ht 73.0 in | Wt 364.2 lb

## 2019-09-28 DIAGNOSIS — D72829 Elevated white blood cell count, unspecified: Secondary | ICD-10-CM | POA: Diagnosis not present

## 2019-09-28 DIAGNOSIS — M5116 Intervertebral disc disorders with radiculopathy, lumbar region: Secondary | ICD-10-CM | POA: Diagnosis not present

## 2019-09-28 DIAGNOSIS — K8020 Calculus of gallbladder without cholecystitis without obstruction: Secondary | ICD-10-CM | POA: Diagnosis not present

## 2019-09-28 DIAGNOSIS — K5792 Diverticulitis of intestine, part unspecified, without perforation or abscess without bleeding: Secondary | ICD-10-CM | POA: Insufficient documentation

## 2019-09-28 NOTE — Progress Notes (Signed)
[facility] [address] [place], [place] [address]  Internal MEDICINE  Office Visit Note  Patient Name: [name] [name]  [id number]  [id number]  Date of Service: 09/28/2019  Chief Complaint  Patient presents with  . Follow-up  . Depression  . Sleep Apnea  . Quality Metric Gaps    hepC, HIV screening    The patient is here for follow up visit. He was in the ER on Saturday. Had severe abdominal pain. CT was consistent with diverticulitis. WBC on CBC was 15.3. He was sent home on flagyl and cipro. Today, he states that his belly pain, like he had on Saturday, is much improved. He is having some diarrhea, likely related to antibiotics. Appetite is improved and he has returned to his normal activities.  Incidental finding on the CT, done at [facility] ER was cholelithiasis. He has a 2.5cm gallstone in the lumen of the gallbladder. There was no evidence of cholecystitis. He states that he is aware of the gallstone. Has known about this for quite some time. States that he is afraid of surgery. Does not want to pursue surgical consultation at this time.  Patient c/o chronic back pain. The pain radiates to his legs. Sometimes legs feel tingly and numb. States that, prior to moving to [place], he was diagnosed with degenerative disc disease. While he was in ER, he also had x-ray done of his lumbar spine. The x-ray did show moderate to severe degenerative changes in the lower lumbar spine. Mild osseous overgrowth of the lower lumbar facets. Mild multilevel intervertebral disc height loss, most pronounced at L2-3 with small endplate osteophytes. He has not seen orthopedic provider.       Current Medication: Outpatient Encounter Medications as of 09/28/2019  Medication Sig  . busPIRone (BUSPAR) 7.5 MG tablet Take 1 tablet (7.5 mg total) by mouth 2 (two) times daily.  Lennox Solders (EUCRISA) 2 % OINT Apply 1 application topically 2 (two) times daily as needed.  Marland Kitchen levothyroxine  (SYNTHROID) 50 MCG tablet Take 1 tablet (50 mcg total) by mouth daily.  . naproxen (NAPROSYN) 500 MG tablet TAKE 1 TABLET BY MOUTH TWICE A DAY AS NEEDED  . NON FORMULARY cpap device  . omeprazole (PRILOSEC) 20 MG capsule TAKE 1 CAPSULE BY MOUTH EVERY DAY   No facility-administered encounter medications on file as of 09/28/2019.    Surgical History: Past Surgical History:  Procedure Laterality Date  . LIVER BIOPSY      Medical History: Past Medical History:  Diagnosis Date  . Anxiety   . Arthritis   . Depression   . Fibromyalgia   . GERD (gastroesophageal reflux disease)   . Glaucoma   . Hepatitis C   . Juvenile epilepsy (HCC)   . Positive TB test   . Sleep apnea     Family History: Family History  Problem Relation Age of Onset  . Aneurysm Mother   . Cancer Father   . Mental illness Sister   . Drug abuse Paternal Aunt   . Drug abuse Maternal Uncle   . Mental illness Sister     Social History   Socioeconomic History  . Marital status: Married    Spouse name: Not on file  . Number of children: 1  . Years of education: Not on file  . Highest education level: Not on file  Occupational History  . Not on file  Tobacco Use  . Smoking status: Never Smoker  . Smokeless tobacco: Never Used  Vaping  Use  . Vaping Use: Never used  Substance and Sexual Activity  . Alcohol use: Not Currently  . Drug use: Not Currently  . Sexual activity: Not on file  Other Topics Concern  . Not on file  Social History Narrative  . Not on file   Social Determinants of Health   Financial Resource Strain:   . Difficulty of Paying Living Expenses:   Food Insecurity:   . Worried About Programme researcher, broadcasting/film/video in the Last Year:   . Barista in the Last Year:   Transportation Needs:   . Freight forwarder (Medical):   Marland Kitchen Lack of Transportation (Non-Medical):   Physical Activity:   . Days of Exercise per Week:   . Minutes of Exercise per Session:   Stress:   . Feeling of  Stress :   Social Connections:   . Frequency of Communication with Friends and Family:   . Frequency of Social Gatherings with Friends and Family:   . Attends Religious Services:   . Active Member of Clubs or Organizations:   . Attends Banker Meetings:   Marland Kitchen Marital Status:   Intimate Partner Violence:   . Fear of Current or Ex-Partner:   . Emotionally Abused:   Marland Kitchen Physically Abused:   . Sexually Abused:       Review of Systems  Constitutional: Positive for fatigue. Negative for activity change, chills and unexpected weight change.  HENT: Negative for congestion, postnasal drip, rhinorrhea, sneezing and sore throat.   Respiratory: Negative for cough, chest tightness, shortness of breath and wheezing.   Cardiovascular: Negative for chest pain and palpitations.  Gastrointestinal: Positive for abdominal pain, diarrhea and nausea. Negative for constipation and vomiting.       Patient is currently taking cipro and flagyl due to recent bout of diverticulitis. States that he is having frequent loose stools since starting antibiotics. No longer having cramping and pain.   Endocrine: Negative for cold intolerance, heat intolerance, polydipsia and polyuria.  Musculoskeletal: Positive for arthralgias, back pain and myalgias. Negative for joint swelling and neck pain.       Patient has intermittent, but severe lower back pain, which radiates into both legs. When severe, legs feel tingling and numb and weak. Does take naproxen when needed to reduce pain and inflammation.   Skin: Negative for rash.  Neurological: Negative for dizziness, tremors, numbness and headaches.  Hematological: Negative for adenopathy. Does not bruise/bleed easily.  Psychiatric/Behavioral: Positive for dysphoric mood. Negative for behavioral problems (Depression), sleep disturbance and suicidal ideas. The patient is nervous/anxious.     Today's Vitals   09/28/19 1357  BP: 122/68  Pulse: 93  Resp: 16  Temp:  98.1 F (36.7 C)  SpO2: 96%  Weight: (!) 364 lb 3.2 oz (165.2 kg)  Height: 6\' 1"  (1.854 m)   Body mass index is 48.05 kg/m.  Physical Exam Vitals and nursing note reviewed.  Constitutional:      General: He is not in acute distress.    Appearance: Normal appearance. He is well-developed. He is obese. He is not diaphoretic.  HENT:     Head: Normocephalic and atraumatic.     Nose: Nose normal.     Mouth/Throat:     Pharynx: No oropharyngeal exudate.  Eyes:     Pupils: Pupils are equal, round, and reactive to light.  Neck:     Thyroid: No thyromegaly.     Vascular: No JVD.     Trachea: No  tracheal deviation.  Cardiovascular:     Rate and Rhythm: Normal rate and regular rhythm.     Heart sounds: Normal heart sounds. No murmur heard.  No friction rub. No gallop.   Pulmonary:     Effort: Pulmonary effort is normal. No respiratory distress.     Breath sounds: Normal breath sounds. No wheezing or rales.  Chest:     Chest wall: No tenderness.  Abdominal:     General: Bowel sounds are normal.     Palpations: Abdomen is soft.     Tenderness: There is no abdominal tenderness.  Musculoskeletal:        General: Normal range of motion.     Cervical back: Normal range of motion and neck supple.     Comments: Moderate lower back pain which is worse with exertion. No visible abnormalities or deformities are noted at this time.   Lymphadenopathy:     Cervical: No cervical adenopathy.  Skin:    General: Skin is warm and dry.  Neurological:     Mental Status: He is alert and oriented to person, place, and time.     Cranial Nerves: No cranial nerve deficit.  Psychiatric:        Mood and Affect: Mood normal.        Behavior: Behavior normal.        Thought Content: Thought content normal.        Judgment: Judgment normal.    Assessment/Plan: 1. Acute diverticulitis Patient in ER on 09/24/2019 and diagnosed with acute diverticulitis. Currently taking cipro and flagyl. He should  continue to take both of these medications until completely gone. Recommended he take probiotic to help promote the growth of good bacteria in the gut. Diverticulitis diet was printed and given to the patient to help prevent further problems with diverticulitis.   2. Leukocytosis, unspecified type WBC 15.3 in ER. Will recheck CBC after he finishes antibiotics. A lab slip was given to the patient today.   3. Lumbar disc disease with radiculopathy X-ray of lumbar spine done in ER showed moderate to severe degenerative changes in the lower lumbar spine. Mild osseous overgrowth of the lower lumbar facets. Mild multilevel intervertebral disc height loss, most pronounced at L2-3 with small endplate osteophytes. Advised the patient to continue to take previously prescribed naproxen as needed and as prescribed. A referral to orthopedics provider was made today.  - Ambulatory referral to Orthopedic Surgery  4. Calculus of gallbladder without cholecystitis without obstruction Incidental finding of 2.5cm gallstone found on CT abdomen and pelvis done in ER. Patient previously aware of gallstone. He does not wish to pursue surgical consultation at this time. Will monitor closely.   General Counseling: Lidia Collum understanding of the findings of todays visit and agrees with plan of treatment. I have discussed any further diagnostic evaluation that may be needed or ordered today. We also reviewed his medications today. he has been encouraged to call the office with any questions or concerns that should arise related to todays visit.  This patient was seen by Vincent Gros FNP Collaboration with Dr Lyndon Code as a part of collaborative care agreement  Orders Placed This Encounter  Procedures  . Ambulatory referral to Orthopedic Surgery    Total time spent:30 Minutes   Time spent includes review of chart, medications, test results, and follow up plan with the patient.      Dr Lyndon Code Internal medicine

## 2019-10-05 ENCOUNTER — Other Ambulatory Visit: Payer: Self-pay | Admitting: Nurse Practitioner

## 2019-10-06 LAB — CBC
Hematocrit: 42.3 % (ref 37.5–51.0)
Hemoglobin: 14.2 g/dL (ref 13.0–17.7)
MCH: 29.2 pg (ref 26.6–33.0)
MCHC: 33.6 g/dL (ref 31.5–35.7)
MCV: 87 fL (ref 79–97)
Platelets: 205 10*3/uL (ref 150–450)
RBC: 4.86 x10E6/uL (ref 4.14–5.80)
RDW: 12.5 % (ref 11.6–15.4)
WBC: 9.9 10*3/uL (ref 3.4–10.8)

## 2019-10-18 ENCOUNTER — Other Ambulatory Visit: Payer: Self-pay | Admitting: Nurse Practitioner

## 2019-10-18 DIAGNOSIS — M5416 Radiculopathy, lumbar region: Secondary | ICD-10-CM

## 2019-11-08 ENCOUNTER — Ambulatory Visit
Admission: RE | Admit: 2019-11-08 | Discharge: 2019-11-08 | Disposition: A | Discharge status: Home / Self Care | Payer: Medicare Other | Source: Ambulatory Visit | Attending: Nurse Practitioner | Admitting: Nurse Practitioner

## 2019-11-08 ENCOUNTER — Other Ambulatory Visit: Payer: Self-pay

## 2019-11-08 DIAGNOSIS — M5416 Radiculopathy, lumbar region: Secondary | ICD-10-CM

## 2019-11-22 ENCOUNTER — Other Ambulatory Visit: Payer: Self-pay

## 2019-11-22 DIAGNOSIS — E039 Hypothyroidism, unspecified: Secondary | ICD-10-CM

## 2019-11-22 MED ORDER — LEVOTHYROXINE SODIUM 50 MCG PO TABS: 50.0000 ug | ORAL_TABLET | Freq: Every day | ORAL | 3 refills | Status: DC

## 2019-12-06 ENCOUNTER — Ambulatory Visit (INDEPENDENT_AMBULATORY_CARE_PROVIDER_SITE_OTHER): Payer: Medicare Other | Admitting: Internal Medicine

## 2019-12-06 ENCOUNTER — Encounter: Payer: Self-pay | Admitting: Internal Medicine

## 2019-12-06 ENCOUNTER — Other Ambulatory Visit: Payer: Self-pay

## 2019-12-06 DIAGNOSIS — K219 Gastro-esophageal reflux disease without esophagitis: Secondary | ICD-10-CM | POA: Diagnosis not present

## 2019-12-06 DIAGNOSIS — Z23 Encounter for immunization: Secondary | ICD-10-CM | POA: Diagnosis not present

## 2019-12-06 DIAGNOSIS — G4733 Obstructive sleep apnea (adult) (pediatric): Secondary | ICD-10-CM

## 2019-12-06 DIAGNOSIS — R0609 Other forms of dyspnea: Secondary | ICD-10-CM

## 2019-12-06 DIAGNOSIS — Z7189 Other specified counseling: Secondary | ICD-10-CM

## 2019-12-06 DIAGNOSIS — Z9989 Dependence on other enabling machines and devices: Secondary | ICD-10-CM

## 2019-12-06 DIAGNOSIS — R0600 Dyspnea, unspecified: Secondary | ICD-10-CM

## 2019-12-06 DIAGNOSIS — Z6841 Body Mass Index (BMI) 40.0 and over, adult: Secondary | ICD-10-CM

## 2019-12-06 NOTE — Progress Notes (Signed)
[facility] [address] [place], [place] [address]  Pulmonary Sleep Medicine   Office Visit Note  Patient Name: [name] [name] DOB: [id number] MRN [id number]  Date of Service: 12/09/2019  Complaints/HPI:  Patient is here for routine pulmonary follow-up Followed for OSA on CPAP, 100% CPAP compliance and AHI well controlled Over the last few weeks he has noticed a significant increase in his shortness of breath with exertion as well as some swelling in his ankles, he has never noticed having this problem before in the past  ROS  General: (-) fever, (-) chills, (-) night sweats, (-) weakness Skin: (-) rashes, (-) itching,. Eyes: (-) visual changes, (-) redness, (-) itching. Nose and Sinuses: (-) nasal stuffiness or itchiness, (-) postnasal drip, (-) nosebleeds, (-) sinus trouble. Mouth and Throat: (-) sore throat, (-) hoarseness. Neck: (-) swollen glands, (-) enlarged thyroid, (-) neck pain. Respiratory: - cough, (-) bloody sputum, + shortness of breath, - wheezing. Cardiovascular: + ankle swelling, (-) chest pain. Lymphatic: (-) lymph node enlargement. Neurologic: (-) numbness, (-) tingling. Psychiatric: (-) anxiety, (-) depression   Current Medication: Outpatient Encounter Medications as of 12/06/2019  Medication Sig  . busPIRone (BUSPAR) 7.5 MG tablet Take 1 tablet (7.5 mg total) by mouth 2 (two) times daily.  Lennox Solders (EUCRISA) 2 % OINT Apply 1 application topically 2 (two) times daily as needed.  Marland Kitchen levothyroxine (SYNTHROID) 50 MCG tablet Take 1 tablet (50 mcg total) by mouth daily.  . naproxen (NAPROSYN) 500 MG tablet TAKE 1 TABLET BY MOUTH TWICE A DAY AS NEEDED  . NON FORMULARY cpap device  . omeprazole (PRILOSEC) 20 MG capsule TAKE 1 CAPSULE BY MOUTH EVERY DAY   No facility-administered encounter medications on file as of 12/06/2019.    Surgical History: Past Surgical History:  Procedure Laterality Date  . LIVER BIOPSY      Medical  History: Past Medical History:  Diagnosis Date  . Anxiety   . Arthritis   . Depression   . Fibromyalgia   . GERD (gastroesophageal reflux disease)   . Glaucoma   . Hepatitis C   . Juvenile epilepsy (HCC)   . Positive TB test   . Sleep apnea     Family History: Family History  Problem Relation Age of Onset  . Aneurysm Mother   . Cancer Father   . Mental illness Sister   . Drug abuse Paternal Aunt   . Drug abuse Maternal Uncle   . Mental illness Sister     Social History: Social History   Socioeconomic History  . Marital status: Married    Spouse name: Not on file  . Number of children: 1  . Years of education: Not on file  . Highest education level: Not on file  Occupational History  . Not on file  Tobacco Use  . Smoking status: Never Smoker  . Smokeless tobacco: Never Used  Vaping Use  . Vaping Use: Never used  Substance and Sexual Activity  . Alcohol use: Not Currently  . Drug use: Not Currently  . Sexual activity: Not on file  Other Topics Concern  . Not on file  Social History Narrative  . Not on file   Social Determinants of Health   Financial Resource Strain:   . Difficulty of Paying Living Expenses: Not on file  Food Insecurity:   . Worried About Programme researcher, broadcasting/film/video in the Last Year: Not on file  . Ran Out of Food in the Last Year: Not on  file  Transportation Needs:   . Freight forwarder (Medical): Not on file  . Lack of Transportation (Non-Medical): Not on file  Physical Activity:   . Days of Exercise per Week: Not on file  . Minutes of Exercise per Session: Not on file  Stress:   . Feeling of Stress : Not on file  Social Connections:   . Frequency of Communication with Friends and Family: Not on file  . Frequency of Social Gatherings with Friends and Family: Not on file  . Attends Religious Services: Not on file  . Active Member of Clubs or Organizations: Not on file  . Attends Banker Meetings: Not on file  . Marital  Status: Not on file  Intimate Partner Violence:   . Fear of Current or Ex-Partner: Not on file  . Emotionally Abused: Not on file  . Physically Abused: Not on file  . Sexually Abused: Not on file    Vital Signs: Blood pressure 108/60, pulse 75, temperature 97.9 F (36.6 C), resp. rate 16, height 6\' 1"  (1.854 m), weight (!) 372 lb 12.8 oz (169.1 kg), SpO2 97 %.  Examination: General Appearance: The patient is well-developed, well-nourished, and in no distress. Skin: Gross inspection of skin unremarkable. Head: normocephalic, no gross deformities. Eyes: no gross deformities noted. ENT: ears appear grossly normal no exudates. Neck: Supple. No thyromegaly. No LAD. Respiratory: Clear throughout, no rhonchi, wheezing or rales noted. Cardiovascular: Normal S1 and S2 without murmur or rub. Extremities: No cyanosis. pulses are equal. Neurologic: Alert and oriented. No involuntary movements.  LABS: Recent Results (from the past 2160 hour(s))  CBC     Status: None   Collection Time: 10/05/19  1:39 PM  Result Value Ref Range   WBC 9.9 3.4 - 10.8 x10E3/uL   RBC 4.86 4.14 - 5.80 x10E6/uL   Hemoglobin 14.2 13.0 - 17.7 g/dL   Hematocrit 10/07/19 25.8 - 51.0 %   MCV 87 79 - 97 fL   MCH 29.2 26.6 - 33.0 pg   MCHC 33.6 31 - 35 g/dL   RDW 52.7 78.2 - 42.3 %   Platelets 205 150 - 450 x10E3/uL    Radiology: MR LUMBAR SPINE WO CONTRAST  Result Date: 11/09/2019 CLINICAL DATA:  60 year old male with low back pain on the left side radiating to the leg. No known injury. EXAM: MRI LUMBAR SPINE WITHOUT CONTRAST TECHNIQUE: Multiplanar, multisequence MR imaging of the lumbar spine was performed. No intravenous contrast was administered. COMPARISON:  Lumbar segmentation appears to be normal and will be none. FINDINGS: Segmentation: Lumbar segmentation appears to be normal and will be designated as such for this report. Alignment: Relatively preserved lumbar lordosis. No spondylolisthesis. Vertebrae: Faint  degenerative marrow edema is associated with the left S1 facet (series 7, image 14). See additional L5-S1 posterior element details below. Normal background bone marrow signal. Benign left S2 hemangioma. No other acute osseous abnormality identified. Intact visible sacrum and SI joints. Conus medullaris and cauda equina: Conus extends to the T12-L1 level, affected by bulky disc herniation and spinal stenosis there, see below. However, no lower spinal cord or conus signal abnormality is identified. Paraspinal and other soft tissues: Visualized abdominal viscera and paraspinal soft tissues are within normal limits. Diverticulosis of the large bowel in the pelvis. Disc levels: T12-L1: Small right paracentral and mostly cephalad disc extrusion (series 5, image 7 and series 8, image 4) is superimposed on circumferential disc bulge. Mild right lateral recess stenosis. Borderline spinal stenosis. L1-L2: Bulky central  disc extrusion (series 5, image 9) is superimposed on circumferential lobulated disc bulging. And there are also bulky far lateral components of disc on both sides here. Mild to moderate spinal stenosis, including mild mass effect at the level of the conus. Mild left greater than right L1 foraminal stenosis. L2-L3: Circumferential disc bulge. Mild epidural lipomatosis and facet hypertrophy. Mild spinal and left L2 foraminal stenosis. L3-L4: Circumferential disc bulge. Mild posterior element hypertrophy. Bilateral foraminal disc with endplate spurring. No significant spinal stenosis but moderate bilateral L3 foraminal stenosis. L4-L5: Circumferential disc bulge with endplate spurring. Moderate posterior element hypertrophy. Mild spinal stenosis. Mild left lateral recess stenosis (left L5 nerve level). Moderate to severe left (series 5, image 13) and moderate right L4 foraminal stenosis. L5-S1: Mild circumferential disc bulge with endplate spurring. Moderate to severe facet hypertrophy greater on the left. No  spinal or convincing lateral recess stenosis. Moderate left and mild right L5 foraminal stenosis. IMPRESSION: 1. Bulky disc degeneration at L1-L2 with a moderate sized posterior disc extrusion results in moderate spinal stenosis at the level of the conus medullaris. Mild conus mass effect, but no lower thoracic cord or conus signal abnormality. 2. Mild spinal stenosis also at L2-L3 and L4-L5, with moderate to severe left foraminal stenosis at the latter. Query left L4 radiculitis. There is also moderate left L3 and L5 foraminal stenosis. 3. Small rightward disc extrusion at T12-L1 resulting in up to mild spinal stenosis at that level. Electronically Signed   By: Odessa FlemingH  Hall M.D.   On: 11/09/2019 09:27    No results found.  No results found.    Assessment and Plan: Patient Active Problem List   Diagnosis Date Noted  . Acute diverticulitis 09/28/2019  . Leukocytosis 09/28/2019  . Lumbar disc disease with radiculopathy 09/28/2019  . Calculus of gallbladder without cholecystitis without obstruction 09/28/2019  . Encounter for general adult medical examination with abnormal findings 12/18/2018  . Hypothyroidism 12/18/2018  . Boil 12/18/2018  . Rash 12/18/2018  . Dysuria 12/18/2018  . PTSD (post-traumatic stress disorder) 10/05/2018  . MDD (major depressive disorder), recurrent episode, moderate (HCC) 10/05/2018    1. OSA on CPAP Continue with nightly CPAP use at current pressures, will continue to routinely monitor  2. CPAP use counseling Discussed importance of adequate CPAP use as well as proper care and cleaning techniques of machine and all supplies.  3. Dyspnea on exertion Will obtain CXR as well as echocardiogram to assess for underlying cardiopulmonary process contributing to symptoms - DG Chest 2 View; Future - ECHOCARDIOGRAM COMPLETE; Future - DG Chest 2 View  4. Gastroesophageal reflux disease without esophagitis Symptoms remain stable at this time on current therapy, will  continue with routine monitoring  5. BMI 45.0-49.9, adult [facility](HCC) Discussed the importance of weight management through healthy eating habits as well as daily exercise as tolerated  6. Flu vaccine need - Flu Vaccine MDCK QUAD PF  General Counseling: I have discussed the findings of the evaluation and examination with [name] BusmanAllen.  I have also discussed any further diagnostic evaluation thatmay be needed or ordered today. [name] [name] verbalizes understanding of the findings of todays visit. We also reviewed his medications today and discussed drug interactions and side effects including but not limited excessive drowsiness and altered mental states. We also discussed that there is always a risk not just to him but also people around him. he has been encouraged to call the office with any questions or concerns that should arise related to todays visit.  Orders  Placed This Encounter  Procedures  . DG Chest 2 View    Standing Status:   Future    Number of Occurrences:   1    Standing Expiration Date:   12/05/2020    Order Specific Question:   Reason for Exam (SYMPTOM  OR DIAGNOSIS REQUIRED)    Answer:   dyspnea    Order Specific Question:   Preferred imaging location?    Answer:   [facility]  . Flu Vaccine MDCK QUAD PF  . ECHOCARDIOGRAM COMPLETE    Standing Status:   Future    Standing Expiration Date:   12/05/2020    Order Specific Question:   Where should this test be performed    Answer:   External    Order Specific Question:   Perflutren DEFINITY (image enhancing agent) should be administered unless hypersensitivity or allergy exist    Answer:   Administer Perflutren    Order Specific Question:   Reason for exam-Echo    Answer:   Dyspnea  786.09 / R06.00     Time spent: 30 Time spent includes review of chart, medications, test results and follow-up plan with the patient.  I have personally obtained a history, examined the patient, evaluated laboratory and imaging results, formulated  the assessment and plan and placed orders. This patient was seen by Brent General AGNP-C in Collaboration with Dr. Freda Munro as a part of collaborative care agreement.    Yevonne Pax, MD [facility] Pulmonary and Critical Care Sleep medicine

## 2019-12-09 ENCOUNTER — Ambulatory Visit
Admission: RE | Admit: 2019-12-09 | Discharge: 2019-12-09 | Disposition: A | Discharge status: Home / Self Care | Payer: Medicare Other | Source: Ambulatory Visit | Attending: Hospice and Palliative Medicine | Admitting: Hospice and Palliative Medicine

## 2019-12-09 ENCOUNTER — Encounter: Payer: Self-pay | Admitting: Internal Medicine

## 2019-12-09 ENCOUNTER — Ambulatory Visit
Admission: RE | Admit: 2019-12-09 | Discharge: 2019-12-09 | Disposition: A | Discharge status: Home / Self Care | Payer: Medicare Other | Attending: Hospice and Palliative Medicine | Admitting: Hospice and Palliative Medicine

## 2019-12-09 ENCOUNTER — Other Ambulatory Visit: Payer: Self-pay

## 2019-12-09 DIAGNOSIS — R0600 Dyspnea, unspecified: Secondary | ICD-10-CM | POA: Insufficient documentation

## 2019-12-09 NOTE — Patient Instructions (Signed)

## 2019-12-12 ENCOUNTER — Ambulatory Visit: Payer: Medicare Other | Admitting: Adult Health

## 2019-12-30 ENCOUNTER — Other Ambulatory Visit: Payer: Self-pay

## 2019-12-30 ENCOUNTER — Ambulatory Visit: Payer: Medicare Other

## 2019-12-30 DIAGNOSIS — R0602 Shortness of breath: Secondary | ICD-10-CM

## 2019-12-30 DIAGNOSIS — I77810 Thoracic aortic ectasia: Secondary | ICD-10-CM

## 2019-12-30 DIAGNOSIS — R0609 Other forms of dyspnea: Secondary | ICD-10-CM

## 2019-12-30 DIAGNOSIS — I5189 Other ill-defined heart diseases: Secondary | ICD-10-CM

## 2019-12-30 HISTORY — DX: Other ill-defined heart diseases: I51.89

## 2019-12-30 HISTORY — DX: Thoracic aortic ectasia: I77.810

## 2020-01-03 ENCOUNTER — Other Ambulatory Visit: Payer: Self-pay

## 2020-01-03 ENCOUNTER — Encounter: Payer: Self-pay | Admitting: Internal Medicine

## 2020-01-03 ENCOUNTER — Ambulatory Visit (INDEPENDENT_AMBULATORY_CARE_PROVIDER_SITE_OTHER): Payer: Medicare Other | Admitting: Internal Medicine

## 2020-01-03 DIAGNOSIS — R110 Nausea: Secondary | ICD-10-CM

## 2020-01-03 DIAGNOSIS — R350 Frequency of micturition: Secondary | ICD-10-CM | POA: Diagnosis not present

## 2020-01-03 DIAGNOSIS — K5792 Diverticulitis of intestine, part unspecified, without perforation or abscess without bleeding: Secondary | ICD-10-CM

## 2020-01-03 DIAGNOSIS — E038 Other specified hypothyroidism: Secondary | ICD-10-CM

## 2020-01-03 DIAGNOSIS — R300 Dysuria: Secondary | ICD-10-CM

## 2020-01-03 LAB — POCT URINALYSIS DIPSTICK
Glucose, UA: NEGATIVE
Leukocytes, UA: NEGATIVE
Nitrite, UA: NEGATIVE
Protein, UA: POSITIVE — AB
Spec Grav, UA: 1.02 (ref 1.010–1.025)
Urobilinogen, UA: 0.2 E.U./dL
pH, UA: 7 (ref 5.0–8.0)

## 2020-01-03 MED ORDER — CIPROFLOXACIN HCL 500 MG PO TABS: 500.0000 mg | ORAL_TABLET | Freq: Two times a day (BID) | ORAL | 0 refills | Status: DC

## 2020-01-03 MED ORDER — PROMETHAZINE HCL 12.5 MG PO TABS: 12.5000 mg | ORAL_TABLET | Freq: Three times a day (TID) | ORAL | 0 refills | Status: DC | PRN

## 2020-01-03 MED ORDER — METRONIDAZOLE 500 MG PO TABS: 500.0000 mg | ORAL_TABLET | Freq: Two times a day (BID) | ORAL | 0 refills | Status: DC

## 2020-01-03 NOTE — Progress Notes (Signed)
[facility] [address] [place], [place] [address]  Internal MEDICINE  Office Visit Note  Patient Name: [name] [name]  [id number]  [id number]  Date of Service: 01/03/2020  Chief Complaint  Patient presents with  . Diverticulitis    started 3 days ago, pain in lower abdomen  . Urinary Frequency    has to go but dont do anything when getting to toilet    HPI  Pt is here for acute and sick visit. Has generalized abdominal pain.has h/o diverticulitis few months ago, has never seen GI or had a colonoscopy in the past. Pt has chronic back pain due to disc herniation and radiculopathy. Takes Synthroid 50 mcg. His free T4 is borderline low. His recent echo showed LVH, diastolic dysfunction. His blood pressure is normal today however will need to check with large cuff with automated machine. Need to look into cpap compliance   His abdominal pain has gotten worse over last 2 days. Has been having problem defecating, has nausea as well, denies any fever or chills   Current Medication: Outpatient Encounter Medications as of 01/03/2020  Medication Sig  . busPIRone (BUSPAR) 7.5 MG tablet Take 1 tablet (7.5 mg total) by mouth 2 (two) times daily.  Lennox Solders (EUCRISA) 2 % OINT Apply 1 application topically 2 (two) times daily as needed.  Marland Kitchen levothyroxine (SYNTHROID) 50 MCG tablet Take 1 tablet (50 mcg total) by mouth daily.  . naproxen (NAPROSYN) 500 MG tablet TAKE 1 TABLET BY MOUTH TWICE A DAY AS NEEDED  . NON FORMULARY cpap device  . omeprazole (PRILOSEC) 20 MG capsule TAKE 1 CAPSULE BY MOUTH EVERY DAY  . ciprofloxacin (CIPRO) 500 MG tablet Take 1 tablet (500 mg total) by mouth 2 (two) times daily.  . metroNIDAZOLE (FLAGYL) 500 MG tablet Take 1 tablet (500 mg total) by mouth 2 (two) times daily.  . promethazine (PHENERGAN) 12.5 MG tablet Take 1 tablet (12.5 mg total) by mouth every 8 (eight) hours as needed for nausea or vomiting.   No facility-administered encounter medications  on file as of 01/03/2020.    Surgical History: Past Surgical History:  Procedure Laterality Date  . LIVER BIOPSY      Medical History: Past Medical History:  Diagnosis Date  . Anxiety   . Arthritis   . Depression   . Diverticulitis   . Fibromyalgia   . GERD (gastroesophageal reflux disease)   . Glaucoma   . Hepatitis C   . Juvenile epilepsy (HCC)   . Positive TB test   . Sleep apnea     Family History: Family History  Problem Relation Age of Onset  . Aneurysm Mother   . Cancer Father   . Mental illness Sister   . Drug abuse Paternal Aunt   . Drug abuse Maternal Uncle   . Mental illness Sister     Social History   Socioeconomic History  . Marital status: Married    Spouse name: Not on file  . Number of children: 1  . Years of education: Not on file  . Highest education level: Not on file  Occupational History  . Not on file  Tobacco Use  . Smoking status: Never Smoker  . Smokeless tobacco: Never Used  Vaping Use  . Vaping Use: Never used  Substance and Sexual Activity  . Alcohol use: Not Currently  . Drug use: Not Currently  . Sexual activity: Not on file  Other Topics Concern  . Not on file  Social History  Narrative  . Not on file   Social Determinants of Health   Financial Resource Strain:   . Difficulty of Paying Living Expenses: Not on file  Food Insecurity:   . Worried About Programme researcher, broadcasting/film/video in the Last Year: Not on file  . Ran Out of Food in the Last Year: Not on file  Transportation Needs:   . Lack of Transportation (Medical): Not on file  . Lack of Transportation (Non-Medical): Not on file  Physical Activity:   . Days of Exercise per Week: Not on file  . Minutes of Exercise per Session: Not on file  Stress:   . Feeling of Stress : Not on file  Social Connections:   . Frequency of Communication with Friends and Family: Not on file  . Frequency of Social Gatherings with Friends and Family: Not on file  . Attends Religious Services:  Not on file  . Active Member of Clubs or Organizations: Not on file  . Attends Banker Meetings: Not on file  . Marital Status: Not on file  Intimate Partner Violence:   . Fear of Current or Ex-Partner: Not on file  . Emotionally Abused: Not on file  . Physically Abused: Not on file  . Sexually Abused: Not on file      Review of Systems  Constitutional: Negative for chills, fatigue and unexpected weight change.  HENT: Positive for postnasal drip. Negative for congestion, rhinorrhea, sneezing and sore throat.   Eyes: Negative for redness.  Respiratory: Negative for cough, chest tightness and shortness of breath.   Cardiovascular: Negative for chest pain and palpitations.  Gastrointestinal: Positive for abdominal distention, abdominal pain and nausea. Negative for constipation, diarrhea and vomiting.  Genitourinary: Negative for dysuria and frequency.  Musculoskeletal: Negative for arthralgias, back pain, joint swelling and neck pain.  Skin: Negative for rash.  Neurological: Negative.  Negative for tremors and numbness.  Hematological: Negative for adenopathy. Does not bruise/bleed easily.  Psychiatric/Behavioral: Negative for behavioral problems (Depression), sleep disturbance and suicidal ideas. The patient is not nervous/anxious.     Vital Signs: BP 128/70   Pulse 81   Temp 98.1 F (36.7 C)   Resp 16   Ht 6\' 1"  (1.854 m)   Wt (!) 388 lb (176 kg)   SpO2 95%   BMI 51.19 kg/m    Physical Exam Constitutional:      General: He is not in acute distress.    Appearance: He is well-developed. He is not diaphoretic.  HENT:     Head: Normocephalic and atraumatic.     Mouth/Throat:     Pharynx: No oropharyngeal exudate.  Eyes:     Pupils: Pupils are equal, round, and reactive to light.  Neck:     Thyroid: No thyromegaly.     Vascular: No JVD.     Trachea: No tracheal deviation.  Cardiovascular:     Rate and Rhythm: Normal rate and regular rhythm.     Heart  sounds: Normal heart sounds. No murmur heard.  No friction rub. No gallop.   Pulmonary:     Effort: Pulmonary effort is normal. No respiratory distress.     Breath sounds: No wheezing or rales.  Chest:     Chest wall: No tenderness.  Abdominal:     General: Bowel sounds are normal.     Palpations: Abdomen is soft.     Tenderness: There is abdominal tenderness. There is no guarding or rebound.  Musculoskeletal:  General: Normal range of motion.     Cervical back: Normal range of motion and neck supple.  Lymphadenopathy:     Cervical: No cervical adenopathy.  Skin:    General: Skin is warm and dry.  Neurological:     Mental Status: He is alert and oriented to person, place, and time.     Cranial Nerves: No cranial nerve deficit.  Psychiatric:        Behavior: Behavior normal.        Thought Content: Thought content normal.        Judgment: Judgment normal.     Assessment/Plan: 1. Acute diverticulitis  Symptomatic treatment for now, will need to see GI, symptoms of diverticular abscess??  - metroNIDAZOLE (FLAGYL) 500 MG tablet; Take 1 tablet (500 mg total) by mouth 2 (two) times daily.  Dispense: 20 tablet; Refill: 0 - ciprofloxacin (CIPRO) 500 MG tablet; Take 1 tablet (500 mg total) by mouth 2 (two) times daily.  Dispense: 20 tablet; Refill: 0 - CT ABDOMEN PELVIS W WO CONTRAST; Future  2. Nausea - promethazine (PHENERGAN) 12.5 MG tablet; Take 1 tablet (12.5 mg total) by mouth every 8 (eight) hours as needed for nausea or vomiting.  Dispense: 20 tablet; Refill: 0  3. Dysuria - POCT Urinalysis Dipstick - CULTURE, URINE COMPREHENSIVE  4. Other specified hypothyroidism Might need to increase Synthroid   General Counseling: [name] verbalizes understanding of the findings of todays visit and agrees with plan of treatment. I have discussed any further diagnostic evaluation that may be needed or ordered today. We also reviewed his medications today. he has been encouraged to  call the office with any questions or concerns that should arise related to todays visit.  Orders Placed This Encounter  Procedures  . CULTURE, URINE COMPREHENSIVE  . CT ABDOMEN PELVIS W WO CONTRAST  . POCT Urinalysis Dipstick    Meds ordered this encounter  Medications  . metroNIDAZOLE (FLAGYL) 500 MG tablet    Sig: Take 1 tablet (500 mg total) by mouth 2 (two) times daily.    Dispense:  20 tablet    Refill:  0  . ciprofloxacin (CIPRO) 500 MG tablet    Sig: Take 1 tablet (500 mg total) by mouth 2 (two) times daily.    Dispense:  20 tablet    Refill:  0  . promethazine (PHENERGAN) 12.5 MG tablet    Sig: Take 1 tablet (12.5 mg total) by mouth every 8 (eight) hours as needed for nausea or vomiting.    Dispense:  20 tablet    Refill:  0    Total time spent:35 Minutes Time spent includes review of chart, medications, test results, and follow up plan with the patient.      Dr Lyndon Code Internal medicine

## 2020-01-03 NOTE — Progress Notes (Signed)
LVH, diastolic dysfunction, will discuss at upcoming visit.

## 2020-01-06 ENCOUNTER — Telehealth: Payer: Self-pay

## 2020-01-06 LAB — CULTURE, URINE COMPREHENSIVE

## 2020-01-06 NOTE — Telephone Encounter (Signed)
Notified PT Cologuard test was negative

## 2020-01-10 ENCOUNTER — Other Ambulatory Visit: Payer: Self-pay | Admitting: Internal Medicine

## 2020-01-10 ENCOUNTER — Other Ambulatory Visit: Payer: Self-pay

## 2020-01-10 DIAGNOSIS — R1084 Generalized abdominal pain: Secondary | ICD-10-CM

## 2020-01-10 NOTE — Progress Notes (Signed)
ct 

## 2020-01-11 ENCOUNTER — Other Ambulatory Visit: Payer: Self-pay

## 2020-01-11 ENCOUNTER — Ambulatory Visit
Admission: RE | Admit: 2020-01-11 | Discharge: 2020-01-11 | Disposition: A | Discharge status: Home / Self Care | Payer: Medicare Other | Source: Ambulatory Visit | Attending: Internal Medicine | Admitting: Internal Medicine

## 2020-01-11 DIAGNOSIS — K5792 Diverticulitis of intestine, part unspecified, without perforation or abscess without bleeding: Secondary | ICD-10-CM | POA: Insufficient documentation

## 2020-01-11 LAB — POCT I-STAT CREATININE: Creatinine, Ser: 0.9 mg/dL (ref 0.61–1.24)

## 2020-01-11 MED ORDER — IOHEXOL 300 MG/ML  SOLN
125.0000 mL | Freq: Once | INTRAMUSCULAR | Status: AC | PRN
  Administered 2020-01-11: 125 mL via INTRAVENOUS

## 2020-01-19 ENCOUNTER — Ambulatory Visit (INDEPENDENT_AMBULATORY_CARE_PROVIDER_SITE_OTHER): Payer: Medicare Other | Admitting: Internal Medicine

## 2020-01-19 ENCOUNTER — Encounter: Payer: Self-pay | Admitting: Internal Medicine

## 2020-01-19 ENCOUNTER — Other Ambulatory Visit: Payer: Self-pay

## 2020-01-19 DIAGNOSIS — I517 Cardiomegaly: Secondary | ICD-10-CM

## 2020-01-19 DIAGNOSIS — K5792 Diverticulitis of intestine, part unspecified, without perforation or abscess without bleeding: Secondary | ICD-10-CM | POA: Diagnosis not present

## 2020-01-19 DIAGNOSIS — R300 Dysuria: Secondary | ICD-10-CM

## 2020-01-19 DIAGNOSIS — M5441 Lumbago with sciatica, right side: Secondary | ICD-10-CM

## 2020-01-19 DIAGNOSIS — Z9989 Dependence on other enabling machines and devices: Secondary | ICD-10-CM

## 2020-01-19 DIAGNOSIS — G4733 Obstructive sleep apnea (adult) (pediatric): Secondary | ICD-10-CM

## 2020-01-19 DIAGNOSIS — M5442 Lumbago with sciatica, left side: Secondary | ICD-10-CM | POA: Diagnosis not present

## 2020-01-19 DIAGNOSIS — G8929 Other chronic pain: Secondary | ICD-10-CM

## 2020-01-19 DIAGNOSIS — Z0001 Encounter for general adult medical examination with abnormal findings: Secondary | ICD-10-CM

## 2020-01-19 DIAGNOSIS — E038 Other specified hypothyroidism: Secondary | ICD-10-CM

## 2020-01-19 MED ORDER — TRAMADOL HCL 50 MG PO TABS: 50.0000 mg | ORAL_TABLET | Freq: Three times a day (TID) | ORAL | 0 refills | Status: AC | PRN

## 2020-01-19 NOTE — Progress Notes (Signed)
[facility] [address] [place], [place] [address]  Internal MEDICINE  Office Visit Note  Patient Name: [name] [name]  [id number]  [id number]  Date of Service: 01/23/2020  Chief Complaint  Patient presents with  . Medicare Wellness    review ct and echo,   . Back Pain    started 2 days ago, lower back and right side, happened when pt stood up from the sofa  . Anxiety  . Depression  . Gastroesophageal Reflux  . Sleep Apnea  . policy update form    received   HPI Pt is here for routine health maintenance examination. He is c/o severe back pain, has not been seen by ortho for a long time. Pt was also seen with abdominal pain, CT abdomen was ordered and has the following findings  1. Acute Diverticulitis of redundant sigmoid colon. No associated abscess or other complicating features. 2. Asymmetry of the inguinal canal suggesting cryptorchid left testis. Correlate with physical exam. 3. Cholelithiasis without CT evidence of acute cholecystitis. Possible hepatic steatosis. Small gastric hiatal hernia. 4. Advanced degeneration in the spine. Pt has been adherent to PAP therapy, recent echo showed moderate LVH, diastoloc dysfunction with mild TR Current Medication: Outpatient Encounter Medications as of 01/19/2020  Medication Sig  . busPIRone (BUSPAR) 7.5 MG tablet Take 1 tablet (7.5 mg total) by mouth 2 (two) times daily.  . ciprofloxacin (CIPRO) 500 MG tablet Take 1 tablet (500 mg total) by mouth 2 (two) times daily.  Lennox Solders (EUCRISA) 2 % OINT Apply 1 application topically 2 (two) times daily as needed.  Marland Kitchen levothyroxine (SYNTHROID) 50 MCG tablet Take 1 tablet (50 mcg total) by mouth daily.  . metroNIDAZOLE (FLAGYL) 500 MG tablet Take 1 tablet (500 mg total) by mouth 2 (two) times daily.  . naproxen (NAPROSYN) 500 MG tablet TAKE 1 TABLET BY MOUTH TWICE A DAY AS NEEDED  . NON FORMULARY cpap device  . omeprazole (PRILOSEC) 20 MG capsule TAKE 1 CAPSULE BY MOUTH  EVERY DAY  . promethazine (PHENERGAN) 12.5 MG tablet Take 1 tablet (12.5 mg total) by mouth every 8 (eight) hours as needed for nausea or vomiting.  . traMADol (ULTRAM) 50 MG tablet Take 1 tablet (50 mg total) by mouth every 8 (eight) hours as needed for up to 5 days.   No facility-administered encounter medications on file as of 01/19/2020.    Surgical History: Past Surgical History:  Procedure Laterality Date  . LIVER BIOPSY      Medical History: Past Medical History:  Diagnosis Date  . Anxiety   . Arthritis   . Depression   . Diverticulitis   . Fibromyalgia   . GERD (gastroesophageal reflux disease)   . Glaucoma   . Hepatitis C   . Juvenile epilepsy (HCC)   . Positive TB test   . Sleep apnea     Family History: Family History  Problem Relation Age of Onset  . Aneurysm Mother   . Cancer Father   . Mental illness Sister   . Drug abuse Paternal Aunt   . Drug abuse Maternal Uncle   . Mental illness Sister       Review of Systems  Constitutional: Negative for chills, fatigue and unexpected weight change.  HENT: Positive for postnasal drip. Negative for congestion, rhinorrhea, sneezing and sore throat.   Eyes: Negative for redness.  Respiratory: Negative for cough, chest tightness and shortness of breath.   Cardiovascular: Negative for chest pain and palpitations.  Gastrointestinal: Negative  for abdominal pain, constipation, diarrhea, nausea and vomiting.  Genitourinary: Negative for dysuria and frequency.  Musculoskeletal: Negative for arthralgias, back pain, joint swelling and neck pain.  Skin: Negative for rash.  Neurological: Negative.  Negative for tremors and numbness.  Hematological: Negative for adenopathy. Does not bruise/bleed easily.  Psychiatric/Behavioral: Negative for behavioral problems (Depression), sleep disturbance and suicidal ideas. The patient is not nervous/anxious.      Vital Signs: BP 130/82   Pulse 76   Temp 97.8 F (36.6 C)   Resp  16   Ht 6\' 1"  (1.854 m)   Wt (!) 365 lb (165.6 kg)   SpO2 97%   BMI 48.16 kg/m    Physical Exam Constitutional:      General: He is not in acute distress.    Appearance: He is well-developed. He is not diaphoretic.  HENT:     Head: Normocephalic and atraumatic.     Mouth/Throat:     Pharynx: No oropharyngeal exudate.  Eyes:     Pupils: Pupils are equal, round, and reactive to light.  Neck:     Thyroid: No thyromegaly.     Vascular: No JVD.     Trachea: No tracheal deviation.  Cardiovascular:     Rate and Rhythm: Normal rate and regular rhythm.     Heart sounds: Normal heart sounds. No murmur heard.  No friction rub. No gallop.   Pulmonary:     Effort: Pulmonary effort is normal. No respiratory distress.     Breath sounds: No wheezing or rales.  Chest:     Chest wall: No tenderness.  Abdominal:     General: Bowel sounds are normal.     Palpations: Abdomen is soft.  Musculoskeletal:        General: Tenderness present.     Cervical back: Normal range of motion and neck supple.     Comments: Paraspinal muscles   Lymphadenopathy:     Cervical: No cervical adenopathy.  Skin:    General: Skin is warm and dry.  Neurological:     Mental Status: He is alert and oriented to person, place, and time.     Cranial Nerves: No cranial nerve deficit.  Psychiatric:        Behavior: Behavior normal.        Thought Content: Thought content normal.        Judgment: Judgment normal.      LABS: Recent Results (from the past 2160 hour(s))  POCT Urinalysis Dipstick     Status: Abnormal   Collection Time: 01/03/20  2:12 PM  Result Value Ref Range   Color, UA     Clarity, UA     Glucose, UA Negative Negative   Bilirubin, UA SMALL    Ketones, UA SMALL    Spec Grav, UA 1.020 1.010 - 1.025   Blood, UA TRACE    pH, UA 7.0 5.0 - 8.0   Protein, UA Positive (A) Negative    Comment: 30+   Urobilinogen, UA 0.2 0.2 or 1.0 E.U./dL   Nitrite, UA NEG    Leukocytes, UA Negative Negative    Appearance     Odor    CULTURE, URINE COMPREHENSIVE     Status: None   Collection Time: 01/03/20  2:15 PM   Specimen: Urine   Urine  Result Value Ref Range   Urine Culture, Comprehensive Final report    Organism ID, Bacteria Comment     Comment: No growth in 36 - 48 hours.  I-STAT  creatinine     Status: None   Collection Time: 01/11/20  9:27 AM  Result Value Ref Range   Creatinine, Ser 0.90 0.61 - 1.24 mg/dL  UA/M w/rflx Culture, Routine     Status: Abnormal   Collection Time: 01/19/20  3:20 AM   Specimen: Urine   Urine  Result Value Ref Range   Specific Gravity, UA 1.017 1.005 - 1.030   pH, UA 8.0 (H) 5.0 - 7.5   Color, UA Yellow Yellow   Appearance Ur Clear Clear   Leukocytes,UA Negative Negative   Protein,UA Negative Negative/Trace   Glucose, UA Negative Negative   Ketones, UA Negative Negative   RBC, UA Negative Negative   Bilirubin, UA Negative Negative   Urobilinogen, Ur 0.2 0.2 - 1.0 mg/dL   Nitrite, UA Negative Negative   Microscopic Examination Comment     Comment: Microscopic follows if indicated.   Microscopic Examination See below:     Comment: Microscopic was indicated and was performed.   Urinalysis Reflex Comment     Comment: This specimen will not reflex to a Urine Culture.  Microscopic Examination     Status: None   Collection Time: 01/19/20  3:20 AM   Urine  Result Value Ref Range   WBC, UA None seen 0 - 5 /hpf   RBC 0-2 0 - 2 /hpf   Epithelial Cells (non renal) None seen 0 - 10 /hpf   Casts None seen None seen /lpf   Bacteria, UA None seen None seen/Few    Assessment/Plan: 1. Encounter for general adult medical examination with abnormal findings Pt is updated as follows for Preventive Health Maintenance (PHM) AWV  Colonoscopy  BMD  PSA   2. Chronic bilateral low back pain with bilateral sciatica Pt Is referred to [facility] for evaluation and treatment   3. Left ventricular hypertrophy Needs better blood pressure control, wt loss and  CPAP compliance   4. Acute diverticulitis Resolved, scheduled to see GI   5. OSA on CPAP Scheduled for Sleep clinic   6. Other specified hypothyroidism Continue synthroid  - TSH + free T4  7. Dysuria - UA/M w/rflx Culture, Routine General Counseling: Delvecchio verbalizes understanding of the findings of todays visit and agrees with plan of treatment. I have discussed any further diagnostic evaluation that may be needed or ordered today. We also reviewed his medications today. he has been encouraged to call the office with any questions or concerns that should arise related to todays visit.  Orders Placed This Encounter  Procedures  . Microscopic Examination  . UA/M w/rflx Culture, Routine  . TSH + free T4    Meds ordered this encounter  Medications  . traMADol (ULTRAM) 50 MG tablet    Sig: Take 1 tablet (50 mg total) by mouth every 8 (eight) hours as needed for up to 5 days.    Dispense:  15 tablet    Refill:  0    Total time spent:35 Minutes  Time spent includes review of chart, medications, test results, and follow up plan with the patient.     Lyndon Code, MD  Internal Medicine

## 2020-01-20 LAB — UA/M W/RFLX CULTURE, ROUTINE
Bilirubin, UA: NEGATIVE
Glucose, UA: NEGATIVE
Ketones, UA: NEGATIVE
Leukocytes,UA: NEGATIVE
Nitrite, UA: NEGATIVE
Protein,UA: NEGATIVE
RBC, UA: NEGATIVE
Specific Gravity, UA: 1.017 (ref 1.005–1.030)
Urobilinogen, Ur: 0.2 mg/dL (ref 0.2–1.0)
pH, UA: 8 — ABNORMAL HIGH (ref 5.0–7.5)

## 2020-01-20 LAB — MICROSCOPIC EXAMINATION
Bacteria, UA: NONE SEEN
Casts: NONE SEEN /lpf
Epithelial Cells (non renal): NONE SEEN /hpf (ref 0–10)
WBC, UA: NONE SEEN /hpf (ref 0–5)

## 2020-01-24 ENCOUNTER — Telehealth: Payer: Self-pay

## 2020-01-24 NOTE — Telephone Encounter (Signed)
Spoke with pt, he will get labs done before his follow up appt.

## 2020-01-24 NOTE — Telephone Encounter (Signed)
-----   Message from Lyndon Code, MD sent at 01/23/2020  2:31 PM EST ----- Please notify pt to get labs done before next follow up

## 2020-01-25 ENCOUNTER — Ambulatory Visit (INDEPENDENT_AMBULATORY_CARE_PROVIDER_SITE_OTHER): Payer: Medicare Other

## 2020-01-25 ENCOUNTER — Other Ambulatory Visit: Payer: Self-pay

## 2020-01-25 DIAGNOSIS — G4733 Obstructive sleep apnea (adult) (pediatric): Secondary | ICD-10-CM | POA: Diagnosis not present

## 2020-01-25 NOTE — Progress Notes (Signed)
95 percentile pressure 12.2   95th percentile leak 24.3   apnea index 0.9 /hr  apnea-hypopnea index  1.9 /hr   total days used  >4 hr 90 days  total days used <4 hr 0 days  Total compliance 100 percent  No problems or questions at this this time

## 2020-01-26 LAB — TSH+FREE T4
Free T4: 1.14 ng/dL (ref 0.82–1.77)
TSH: 0.606 u[IU]/mL (ref 0.450–4.500)

## 2020-02-02 LAB — COLOGUARD

## 2020-02-02 NOTE — Progress Notes (Signed)
Negative cologuard

## 2020-02-09 ENCOUNTER — Telehealth: Payer: Self-pay

## 2020-02-09 NOTE — Telephone Encounter (Signed)
Tried to reach pt twice to reschedule his pcp appt on 03/06/20. [name] [name]

## 2020-02-22 ENCOUNTER — Other Ambulatory Visit: Payer: Self-pay

## 2020-02-22 DIAGNOSIS — E039 Hypothyroidism, unspecified: Secondary | ICD-10-CM

## 2020-02-22 MED ORDER — LEVOTHYROXINE SODIUM 50 MCG PO TABS: 50.0000 ug | ORAL_TABLET | Freq: Every day | ORAL | 3 refills | Status: DC

## 2020-03-06 ENCOUNTER — Ambulatory Visit: Payer: Medicare Other | Admitting: Internal Medicine

## 2020-03-07 DIAGNOSIS — G4733 Obstructive sleep apnea (adult) (pediatric): Secondary | ICD-10-CM | POA: Diagnosis not present

## 2020-03-08 ENCOUNTER — Ambulatory Visit: Payer: Medicare HMO | Admitting: Hospice and Palliative Medicine

## 2020-03-12 ENCOUNTER — Encounter: Payer: Self-pay | Admitting: Physician Assistant

## 2020-03-12 ENCOUNTER — Ambulatory Visit (INDEPENDENT_AMBULATORY_CARE_PROVIDER_SITE_OTHER): Payer: Medicare HMO | Admitting: Physician Assistant

## 2020-03-12 VITALS — BP 126/80 | HR 97 | Temp 97.8°F | Resp 16 | Ht 73.0 in | Wt 353.2 lb

## 2020-03-12 DIAGNOSIS — R7301 Impaired fasting glucose: Secondary | ICD-10-CM

## 2020-03-12 DIAGNOSIS — F331 Major depressive disorder, recurrent, moderate: Secondary | ICD-10-CM

## 2020-03-12 DIAGNOSIS — E038 Other specified hypothyroidism: Secondary | ICD-10-CM | POA: Diagnosis not present

## 2020-03-12 DIAGNOSIS — G4733 Obstructive sleep apnea (adult) (pediatric): Secondary | ICD-10-CM | POA: Diagnosis not present

## 2020-03-12 DIAGNOSIS — Z9989 Dependence on other enabling machines and devices: Secondary | ICD-10-CM

## 2020-03-12 DIAGNOSIS — R69 Illness, unspecified: Secondary | ICD-10-CM | POA: Diagnosis not present

## 2020-03-12 LAB — POCT GLYCOSYLATED HEMOGLOBIN (HGB A1C): Hemoglobin A1C: 5.6 % (ref 4.0–5.6)

## 2020-03-12 MED ORDER — LEVOTHYROXINE SODIUM 75 MCG PO TABS: 75.0000 ug | ORAL_TABLET | Freq: Every day | ORAL | 3 refills | Status: DC

## 2020-03-12 NOTE — Progress Notes (Signed)
[facility] [address] [place], [place] [address]  Internal MEDICINE  Office Visit Note  Patient Name: [name] [name]  [id number]  [id number]  Date of Service: 03/17/2020  Chief Complaint  Patient presents with  . Follow-up  . Depression    HPI Pt presents with wife for f/u. He is trying to work on diet and exercise. States he lost some weight. Wt was 365 lbs on 01/19/20 (lost 12 pounds). Trying to stay away from fast food. He was on a diet for diverticulitis prior to the holidays, but holidays interrupted diet goals. His personal weight loss goal is to get down to 325 pounds in 60months. Wants to just work on diet and exercise currently. Discussed echo results: LVH and diastolic dysfunction. BP well controlled. Compliant on CPAP. Synthroid increase to since free T4 low. Will recheck labs before next visit. Otherwise doing well on his current medications.   Current Medication: Outpatient Encounter Medications as of 03/12/2020  Medication Sig  . levothyroxine (SYNTHROID) 75 MCG tablet Take 1 tablet (75 mcg total) by mouth daily.  . busPIRone (BUSPAR) 7.5 MG tablet Take 1 tablet (7.5 mg total) by mouth 2 (two) times daily.  . ciprofloxacin (CIPRO) 500 MG tablet Take 1 tablet (500 mg total) by mouth 2 (two) times daily.  Lennox Solders (EUCRISA) 2 % OINT Apply 1 application topically 2 (two) times daily as needed.  . metroNIDAZOLE (FLAGYL) 500 MG tablet Take 1 tablet (500 mg total) by mouth 2 (two) times daily.  . naproxen (NAPROSYN) 500 MG tablet TAKE 1 TABLET BY MOUTH TWICE A DAY AS NEEDED  . NON FORMULARY cpap device  . omeprazole (PRILOSEC) 20 MG capsule TAKE 1 CAPSULE BY MOUTH EVERY DAY  . promethazine (PHENERGAN) 12.5 MG tablet Take 1 tablet (12.5 mg total) by mouth every 8 (eight) hours as needed for nausea or vomiting.  . [DISCONTINUED] levothyroxine (SYNTHROID) 50 MCG tablet Take 1 tablet (50 mcg total) by mouth daily.   No facility-administered encounter  medications on file as of 03/12/2020.    Surgical History: Past Surgical History:  Procedure Laterality Date  . LIVER BIOPSY      Medical History: Past Medical History:  Diagnosis Date  . Anxiety   . Arthritis   . Depression   . Diverticulitis   . Fibromyalgia   . GERD (gastroesophageal reflux disease)   . Glaucoma   . Hepatitis C   . Juvenile epilepsy (HCC)   . Positive TB test   . Sleep apnea     Family History: Family History  Problem Relation Age of Onset  . Aneurysm Mother   . Cancer Father   . Mental illness Sister   . Drug abuse Paternal Aunt   . Drug abuse Maternal Uncle   . Mental illness Sister     Social History   Socioeconomic History  . Marital status: Married    Spouse name: Not on file  . Number of children: 1  . Years of education: Not on file  . Highest education level: Not on file  Occupational History  . Not on file  Tobacco Use  . Smoking status: Never Smoker  . Smokeless tobacco: Never Used  Vaping Use  . Vaping Use: Never used  Substance and Sexual Activity  . Alcohol use: Not Currently  . Drug use: Not Currently  . Sexual activity: Not on file  Other Topics Concern  . Not on file  Social History Narrative  . Not on file  Social Determinants of Health   Financial Resource Strain: Not on file  Food Insecurity: Not on file  Transportation Needs: Not on file  Physical Activity: Not on file  Stress: Not on file  Social Connections: Not on file  Intimate Partner Violence: Not on file      Review of Systems  Constitutional: Negative for chills, fatigue and unexpected weight change.  HENT: Negative for congestion, postnasal drip, rhinorrhea, sneezing and sore throat.   Eyes: Negative for redness.  Respiratory: Negative for cough, chest tightness and shortness of breath.   Cardiovascular: Negative for chest pain and palpitations.  Gastrointestinal: Negative for abdominal pain, constipation, diarrhea, nausea and vomiting.   Genitourinary: Negative for dysuria and frequency.  Musculoskeletal: Negative for arthralgias, back pain, joint swelling and neck pain.  Skin: Negative for rash.  Neurological: Negative.  Negative for tremors and numbness.  Hematological: Negative for adenopathy. Does not bruise/bleed easily.  Psychiatric/Behavioral: Negative for behavioral problems (Depression), sleep disturbance and suicidal ideas. The patient is nervous/anxious.     Vital Signs: BP 126/80   Pulse 97   Temp 97.8 F (36.6 C)   Resp 16   Ht 6\' 1"  (1.854 m)   Wt (!) 353 lb 3.2 oz (160.2 kg)   SpO2 98%   BMI 46.60 kg/m    Physical Exam Constitutional:      General: He is not in acute distress.    Appearance: He is well-developed. He is obese. He is not diaphoretic.  HENT:     Head: Normocephalic and atraumatic.     Mouth/Throat:     Pharynx: No oropharyngeal exudate.  Eyes:     Pupils: Pupils are equal, round, and reactive to light.  Neck:     Thyroid: No thyromegaly.     Vascular: No JVD.     Trachea: No tracheal deviation.  Cardiovascular:     Rate and Rhythm: Normal rate and regular rhythm.     Heart sounds: Normal heart sounds. No murmur heard. No friction rub. No gallop.   Pulmonary:     Effort: Pulmonary effort is normal. No respiratory distress.     Breath sounds: No wheezing or rales.  Chest:     Chest wall: No tenderness.  Abdominal:     General: Bowel sounds are normal.     Palpations: Abdomen is soft.  Musculoskeletal:        General: Normal range of motion.     Cervical back: Normal range of motion and neck supple.  Lymphadenopathy:     Cervical: No cervical adenopathy.  Skin:    General: Skin is warm and dry.  Neurological:     Mental Status: He is alert and oriented to person, place, and time.     Cranial Nerves: No cranial nerve deficit.  Psychiatric:        Behavior: Behavior normal.        Thought Content: Thought content normal.        Judgment: Judgment normal.         Assessment/Plan: 1. Morbid obesity (HCC) Discussed the importance of weight management through healthy eating and daily exercise as tolerated. Discussed the negative effects obesity has on pulmonary health, cardiac health as well as overall general health and well being. Pt wants to set weight loss goal of getting down to 325 pounds initially and then go from there.  2. Other specified hypothyroidism Increased dose to and will recheck labs prior to f/u visit in 6wks. - levothyroxine (SYNTHROID)  75 MCG tablet; Take 1 tablet (75 mcg total) by mouth daily.  Dispense: 90 tablet; Refill: 3 - TSH + free T4  3. MDD (major depressive disorder), recurrent episode, moderate (HCC) Stable. Will continue on current medication.  4. OSA on CPAP Compliant patient, using CPAP nightly.  5. Impaired fasting glucose - POCT HgB A1c 5.6 today, will continue to monitor.Start Trulicity on next visit   6. Left ventricular hypertrophy Echo done 01/02/20. Will continue to monitor BP, continue great CPAP compliance, and work on wt loss goals.  General Counseling: Lidia Collum understanding of the findings of todays visit and agrees with plan of treatment. I have discussed any further diagnostic evaluation that may be needed or ordered today. We also reviewed his medications today. he has been encouraged to call the office with any questions or concerns that should arise related to todays visit.    Orders Placed This Encounter  Procedures  . TSH + free T4  . POCT HgB A1C    Meds ordered this encounter  Medications  . levothyroxine (SYNTHROID) 75 MCG tablet    Sig: Take 1 tablet (75 mcg total) by mouth daily.    Dispense:  90 tablet    Refill:  3    Total time spent:30 Minutes Time spent includes review of chart, medications, test results, and follow up plan with the patient.      Dr Lyndon Code Internal medicine

## 2020-03-12 NOTE — Patient Instructions (Signed)

## 2020-03-23 DIAGNOSIS — G4733 Obstructive sleep apnea (adult) (pediatric): Secondary | ICD-10-CM | POA: Diagnosis not present

## 2020-03-27 ENCOUNTER — Ambulatory Visit: Payer: Medicare HMO | Admitting: Internal Medicine

## 2020-04-04 ENCOUNTER — Ambulatory Visit
Admission: RE | Admit: 2020-04-04 | Discharge: 2020-04-04 | Disposition: A | Discharge status: Home / Self Care | Payer: Medicare HMO | Source: Ambulatory Visit | Attending: Hospice and Palliative Medicine | Admitting: Hospice and Palliative Medicine

## 2020-04-04 ENCOUNTER — Other Ambulatory Visit: Payer: Self-pay

## 2020-04-04 ENCOUNTER — Other Ambulatory Visit: Payer: Self-pay | Admitting: Hospice and Palliative Medicine

## 2020-04-04 DIAGNOSIS — R1032 Left lower quadrant pain: Secondary | ICD-10-CM | POA: Diagnosis not present

## 2020-04-04 DIAGNOSIS — K5792 Diverticulitis of intestine, part unspecified, without perforation or abscess without bleeding: Secondary | ICD-10-CM

## 2020-04-04 DIAGNOSIS — R109 Unspecified abdominal pain: Secondary | ICD-10-CM | POA: Diagnosis not present

## 2020-04-04 DIAGNOSIS — K5720 Diverticulitis of large intestine with perforation and abscess without bleeding: Secondary | ICD-10-CM | POA: Diagnosis not present

## 2020-04-04 MED ORDER — CIPROFLOXACIN HCL 500 MG PO TABS: 500.0000 mg | ORAL_TABLET | Freq: Two times a day (BID) | ORAL | 0 refills | Status: DC

## 2020-04-04 MED ORDER — METRONIDAZOLE 500 MG PO TABS: 500.0000 mg | ORAL_TABLET | Freq: Two times a day (BID) | ORAL | 0 refills | Status: DC

## 2020-04-04 MED ORDER — ONDANSETRON 4 MG PO TBDP: 4.0000 mg | ORAL_TABLET | Freq: Three times a day (TID) | ORAL | 0 refills | Status: DC | PRN

## 2020-04-04 NOTE — Progress Notes (Signed)
Wife called concerned about her husband. He is complaining of lower abdominal pain, frequent diarrhea--having episode about 1-2 times per hour. History of diverticulitis--last treated in November. Will send for KUB, treat with Flagyl and Cipro. Scheduled to go out of town tomorrow. Will follow-up and consider referral to GI as indicated.

## 2020-04-06 ENCOUNTER — Other Ambulatory Visit: Payer: Self-pay

## 2020-04-06 ENCOUNTER — Inpatient Hospital Stay
Admission: EM | Admit: 2020-04-06 | Discharge: 2020-04-08 | DRG: 392 | Disposition: A | Discharge status: Home / Self Care | Payer: Medicare HMO | Attending: Internal Medicine | Admitting: Internal Medicine

## 2020-04-06 ENCOUNTER — Emergency Department: Payer: Medicare HMO

## 2020-04-06 DIAGNOSIS — G4733 Obstructive sleep apnea (adult) (pediatric): Secondary | ICD-10-CM | POA: Diagnosis present

## 2020-04-06 DIAGNOSIS — F419 Anxiety disorder, unspecified: Secondary | ICD-10-CM | POA: Diagnosis present

## 2020-04-06 DIAGNOSIS — Z79890 Hormone replacement therapy (postmenopausal): Secondary | ICD-10-CM | POA: Diagnosis not present

## 2020-04-06 DIAGNOSIS — Z888 Allergy status to other drugs, medicaments and biological substances status: Secondary | ICD-10-CM | POA: Diagnosis not present

## 2020-04-06 DIAGNOSIS — G40909 Epilepsy, unspecified, not intractable, without status epilepticus: Secondary | ICD-10-CM | POA: Diagnosis present

## 2020-04-06 DIAGNOSIS — M1990 Unspecified osteoarthritis, unspecified site: Secondary | ICD-10-CM | POA: Diagnosis present

## 2020-04-06 DIAGNOSIS — M797 Fibromyalgia: Secondary | ICD-10-CM | POA: Diagnosis present

## 2020-04-06 DIAGNOSIS — M545 Low back pain, unspecified: Secondary | ICD-10-CM | POA: Diagnosis present

## 2020-04-06 DIAGNOSIS — K5720 Diverticulitis of large intestine with perforation and abscess without bleeding: Secondary | ICD-10-CM | POA: Diagnosis not present

## 2020-04-06 DIAGNOSIS — K5792 Diverticulitis of intestine, part unspecified, without perforation or abscess without bleeding: Secondary | ICD-10-CM | POA: Diagnosis present

## 2020-04-06 DIAGNOSIS — Z20822 Contact with and (suspected) exposure to covid-19: Secondary | ICD-10-CM | POA: Diagnosis not present

## 2020-04-06 DIAGNOSIS — Z6841 Body Mass Index (BMI) 40.0 and over, adult: Secondary | ICD-10-CM | POA: Diagnosis not present

## 2020-04-06 DIAGNOSIS — K5732 Diverticulitis of large intestine without perforation or abscess without bleeding: Secondary | ICD-10-CM | POA: Diagnosis not present

## 2020-04-06 DIAGNOSIS — Z743 Need for continuous supervision: Secondary | ICD-10-CM | POA: Diagnosis not present

## 2020-04-06 DIAGNOSIS — Z818 Family history of other mental and behavioral disorders: Secondary | ICD-10-CM

## 2020-04-06 DIAGNOSIS — X501XXA Overexertion from prolonged static or awkward postures, initial encounter: Secondary | ICD-10-CM | POA: Diagnosis not present

## 2020-04-06 DIAGNOSIS — Z886 Allergy status to analgesic agent status: Secondary | ICD-10-CM | POA: Diagnosis not present

## 2020-04-06 DIAGNOSIS — H409 Unspecified glaucoma: Secondary | ICD-10-CM | POA: Diagnosis present

## 2020-04-06 DIAGNOSIS — R69 Illness, unspecified: Secondary | ICD-10-CM | POA: Diagnosis not present

## 2020-04-06 DIAGNOSIS — S39012A Strain of muscle, fascia and tendon of lower back, initial encounter: Secondary | ICD-10-CM

## 2020-04-06 DIAGNOSIS — R1032 Left lower quadrant pain: Secondary | ICD-10-CM | POA: Diagnosis not present

## 2020-04-06 DIAGNOSIS — Z79899 Other long term (current) drug therapy: Secondary | ICD-10-CM | POA: Diagnosis not present

## 2020-04-06 DIAGNOSIS — Z8619 Personal history of other infectious and parasitic diseases: Secondary | ICD-10-CM | POA: Diagnosis not present

## 2020-04-06 DIAGNOSIS — F32A Depression, unspecified: Secondary | ICD-10-CM | POA: Diagnosis present

## 2020-04-06 DIAGNOSIS — G4730 Sleep apnea, unspecified: Secondary | ICD-10-CM | POA: Diagnosis present

## 2020-04-06 DIAGNOSIS — K219 Gastro-esophageal reflux disease without esophagitis: Secondary | ICD-10-CM | POA: Diagnosis present

## 2020-04-06 DIAGNOSIS — M549 Dorsalgia, unspecified: Secondary | ICD-10-CM | POA: Diagnosis not present

## 2020-04-06 DIAGNOSIS — E039 Hypothyroidism, unspecified: Secondary | ICD-10-CM | POA: Diagnosis present

## 2020-04-06 LAB — CBC WITH DIFFERENTIAL/PLATELET
Abs Immature Granulocytes: 0.1 10*3/uL — ABNORMAL HIGH (ref 0.00–0.07)
Basophils Absolute: 0.1 10*3/uL (ref 0.0–0.1)
Basophils Relative: 0 %
Eosinophils Absolute: 0.1 10*3/uL (ref 0.0–0.5)
Eosinophils Relative: 0 %
HCT: 42.1 % (ref 39.0–52.0)
Hemoglobin: 14.2 g/dL (ref 13.0–17.0)
Immature Granulocytes: 1 %
Lymphocytes Relative: 14 %
Lymphs Abs: 2.1 10*3/uL (ref 0.7–4.0)
MCH: 29.2 pg (ref 26.0–34.0)
MCHC: 33.7 g/dL (ref 30.0–36.0)
MCV: 86.4 fL (ref 80.0–100.0)
Monocytes Absolute: 1.4 10*3/uL — ABNORMAL HIGH (ref 0.1–1.0)
Monocytes Relative: 9 %
Neutro Abs: 11.3 10*3/uL — ABNORMAL HIGH (ref 1.7–7.7)
Neutrophils Relative %: 76 %
Platelets: 222 10*3/uL (ref 150–400)
RBC: 4.87 MIL/uL (ref 4.22–5.81)
RDW: 12.5 % (ref 11.5–15.5)
WBC: 15 10*3/uL — ABNORMAL HIGH (ref 4.0–10.5)
nRBC: 0 % (ref 0.0–0.2)

## 2020-04-06 LAB — COMPREHENSIVE METABOLIC PANEL
ALT: 18 U/L (ref 0–44)
AST: 22 U/L (ref 15–41)
Albumin: 4.2 g/dL (ref 3.5–5.0)
Alkaline Phosphatase: 63 U/L (ref 38–126)
Anion gap: 12 (ref 5–15)
BUN: 16 mg/dL (ref 6–20)
CO2: 20 mmol/L — ABNORMAL LOW (ref 22–32)
Calcium: 9.2 mg/dL (ref 8.9–10.3)
Chloride: 103 mmol/L (ref 98–111)
Creatinine, Ser: 1.05 mg/dL (ref 0.61–1.24)
GFR, Estimated: >60 mL/min (ref >60)
Glucose, Bld: 113 mg/dL — ABNORMAL HIGH (ref 70–99)
Potassium: 3.7 mmol/L (ref 3.5–5.1)
Sodium: 135 mmol/L (ref 135–145)
Total Bilirubin: 1.6 mg/dL — ABNORMAL HIGH (ref 0.3–1.2)
Total Protein: 8.8 g/dL — ABNORMAL HIGH (ref 6.5–8.1)

## 2020-04-06 LAB — LIPASE, BLOOD: Lipase: 30 U/L (ref 11–51)

## 2020-04-06 LAB — RESP PANEL BY RT-PCR (FLU A&B, COVID) ARPGX2
Influenza A by PCR: NEGATIVE
Influenza B by PCR: NEGATIVE
SARS Coronavirus 2 by RT PCR: NEGATIVE

## 2020-04-06 MED ORDER — LACTATED RINGERS IV BOLUS
1000.0000 mL | Freq: Once | INTRAVENOUS | Status: AC
  Administered 2020-04-06: 1000 mL via INTRAVENOUS

## 2020-04-06 MED ORDER — MORPHINE SULFATE (PF) 2 MG/ML IV SOLN
2.0000 mg | INTRAVENOUS | Status: DC | PRN
Start: 2020-04-06 — End: 2020-04-08

## 2020-04-06 MED ORDER — LEVOTHYROXINE SODIUM 75 MCG PO TABS
75.0000 ug | ORAL_TABLET | Freq: Every day | ORAL | Status: DC
  Administered 2020-04-07 – 2020-04-08 (×2): 75 ug via ORAL
  Filled 2020-04-06 (×2): qty 3
  Filled 2020-04-06 (×2): qty 1

## 2020-04-06 MED ORDER — MORPHINE SULFATE (PF) 4 MG/ML IV SOLN
4.0000 mg | Freq: Once | INTRAVENOUS | Status: AC
  Administered 2020-04-06: 4 mg via INTRAVENOUS
  Filled 2020-04-06: qty 1

## 2020-04-06 MED ORDER — SODIUM CHLORIDE 0.9 % IV SOLN: INTRAVENOUS | Status: DC

## 2020-04-06 MED ORDER — ONDANSETRON HCL 4 MG/2ML IJ SOLN
4.0000 mg | Freq: Once | INTRAMUSCULAR | Status: AC
  Administered 2020-04-06: 4 mg via INTRAVENOUS
  Filled 2020-04-06: qty 2

## 2020-04-06 MED ORDER — PIPERACILLIN-TAZOBACTAM 3.375 G IVPB
3.3750 g | Freq: Three times a day (TID) | INTRAVENOUS | Status: DC
  Administered 2020-04-06 – 2020-04-08 (×6): 3.375 g via INTRAVENOUS
  Filled 2020-04-06 (×8): qty 50

## 2020-04-06 MED ORDER — KETOROLAC TROMETHAMINE 30 MG/ML IJ SOLN
30.0000 mg | Freq: Four times a day (QID) | INTRAMUSCULAR | Status: DC
  Administered 2020-04-06 – 2020-04-08 (×8): 30 mg via INTRAVENOUS
  Filled 2020-04-06 (×8): qty 1

## 2020-04-06 MED ORDER — ONDANSETRON HCL 4 MG PO TABS: 4.0000 mg | ORAL_TABLET | Freq: Four times a day (QID) | ORAL | Status: DC | PRN

## 2020-04-06 MED ORDER — PIPERACILLIN-TAZOBACTAM 3.375 G IVPB 30 MIN
3.3750 g | Freq: Once | INTRAVENOUS | Status: AC
  Administered 2020-04-06: 3.375 g via INTRAVENOUS
  Filled 2020-04-06: qty 50

## 2020-04-06 MED ORDER — IOHEXOL 12 MG/ML PO SOLN
500.0000 mL | ORAL | Status: AC
  Administered 2020-04-06 (×2): 500 mL via ORAL

## 2020-04-06 MED ORDER — PIPERACILLIN-TAZOBACTAM 3.375 G IVPB 30 MIN: 3.3750 g | Freq: Four times a day (QID) | INTRAVENOUS | Status: DC

## 2020-04-06 MED ORDER — CYCLOBENZAPRINE HCL 10 MG PO TABS
10.0000 mg | ORAL_TABLET | Freq: Three times a day (TID) | ORAL | Status: DC | PRN
  Administered 2020-04-06 – 2020-04-07 (×5): 10 mg via ORAL
  Filled 2020-04-06 (×5): qty 1

## 2020-04-06 MED ORDER — ENOXAPARIN SODIUM 40 MG/0.4ML ~~LOC~~ SOLN: 40.0000 mg | SUBCUTANEOUS | Status: DC

## 2020-04-06 MED ORDER — IOHEXOL 300 MG/ML  SOLN
125.0000 mL | Freq: Once | INTRAMUSCULAR | Status: AC | PRN
  Administered 2020-04-06: 125 mL via INTRAVENOUS

## 2020-04-06 MED ORDER — PANTOPRAZOLE SODIUM 40 MG PO TBEC
40.0000 mg | DELAYED_RELEASE_TABLET | Freq: Every day | ORAL | Status: DC
  Administered 2020-04-06 – 2020-04-08 (×3): 40 mg via ORAL
  Filled 2020-04-06 (×3): qty 1

## 2020-04-06 MED ORDER — ONDANSETRON HCL 4 MG/2ML IJ SOLN: 4.0000 mg | Freq: Four times a day (QID) | INTRAMUSCULAR | Status: DC | PRN

## 2020-04-06 MED ORDER — ENOXAPARIN SODIUM 80 MG/0.8ML ~~LOC~~ SOLN
0.5000 mg/kg | SUBCUTANEOUS | Status: DC
  Administered 2020-04-06 – 2020-04-07 (×2): 80 mg via SUBCUTANEOUS
  Filled 2020-04-06 (×2): qty 0.8

## 2020-04-06 MED ORDER — FENTANYL CITRATE (PF) 100 MCG/2ML IJ SOLN
50.0000 ug | Freq: Once | INTRAMUSCULAR | Status: AC
  Administered 2020-04-06: 50 ug via INTRAVENOUS
  Filled 2020-04-06: qty 2

## 2020-04-06 NOTE — Consult Note (Signed)
[facility] GI Inpatient Consult Note   Jamey Reas, M.D.  Reason for Consult: Acute diverticulitis with contained perforation. Consult for outpatient colonoscopy prior to elective colectomy.   Attending Requesting Consult: Lucile Shutters, M.D.   History of Present Illness: [name] [name] is a 60 y.o. male with history of lumbar disc disease, cholelithiasis, GERD hypothyroidism and obesity presents to the emergency room after several days of progressive lower abdominal pain.  Patient had CT scan of the abdomen and pelvis showing diverticulitis in the sigmoid with fat stranding and small foci of intraperitoneal gas suggesting a contained colon perforation.  Patient denied any severe pain. Dr. [name] from surgery saw the patient and advised conservative management until infection resolves and elective partial colectomy can be performed on the affected segment.  I have been asked to see the patient to set up outpatient colonoscopy preoperatively to rule out neoplasm and other confounding lesions that could change surgical plans.  Patient says he has had two other milder episodes of diverticulitis which were treated with oral antibiotics and resolved. A yesterday evening, however, he was having lower abdominal cramping, He then went to rest; he was trying to lie in bed and felt a "severe" pain in the LLQ that did not resolve spontaneously. He called his wife and EMS came and brought the patient to the hospital. No rectal bleeding, syncope. Patient says while he has had no bowel movement, he is passing flatus without difficulty.   Past Medical History:  Past Medical History:  Diagnosis Date  . Anxiety   . Arthritis   . Depression   . Diverticulitis   . Fibromyalgia   . GERD (gastroesophageal reflux disease)   . Glaucoma   . Hepatitis C   . Juvenile epilepsy (HCC)   . Positive TB test   . Sleep apnea     Problem List: Patient Active Problem List   Diagnosis Date Noted  . GERD  (gastroesophageal reflux disease)   . Depression   . Obesity, Class III, BMI 40-49.9 (morbid obesity) (HCC)   . OSA (obstructive sleep apnea)   . Acute diverticulitis 09/28/2019  . Leukocytosis 09/28/2019  . Lumbar disc disease with radiculopathy 09/28/2019  . Calculus of gallbladder without cholecystitis without obstruction 09/28/2019  . Encounter for general adult medical examination with abnormal findings 12/18/2018  . Hypothyroidism 12/18/2018  . Boil 12/18/2018  . Rash 12/18/2018  . Dysuria 12/18/2018  . PTSD (post-traumatic stress disorder) 10/05/2018  . MDD (major depressive disorder), recurrent episode, moderate (HCC) 10/05/2018    Past Surgical History: Past Surgical History:  Procedure Laterality Date  . LIVER BIOPSY      Allergies: Allergies  Allergen Reactions  . Celecoxib   . Zoloft [Sertraline Hcl] Anxiety    Home Medications: Medications Prior to Admission  Medication Sig Dispense Refill Last Dose  . ciprofloxacin (CIPRO) 500 MG tablet Take 1 tablet (500 mg total) by mouth 2 (two) times daily. 20 tablet 0 04/05/2020 at 2100  . Crisaborole (EUCRISA) 2 % OINT Apply 1 application topically 2 (two) times daily as needed. 100 g 0 prn at prn  . levothyroxine (SYNTHROID) 75 MCG tablet Take 1 tablet (75 mcg total) by mouth daily. 90 tablet 3 04/05/2020 at 0700  . metroNIDAZOLE (FLAGYL) 500 MG tablet Take 1 tablet (500 mg total) by mouth 2 (two) times daily. 20 tablet 0 04/05/2020 at 2100  . naproxen (NAPROSYN) 500 MG tablet TAKE 1 TABLET BY MOUTH TWICE A DAY AS NEEDED 60 tablet 1  prn at prn  . NON FORMULARY cpap device     . omeprazole (PRILOSEC) 20 MG capsule TAKE 1 CAPSULE BY MOUTH EVERY DAY 30 capsule 3 04/05/2020 at 0700  . ondansetron (ZOFRAN ODT) 4 MG disintegrating tablet Take 1 tablet (4 mg total) by mouth every 8 (eight) hours as needed for nausea or vomiting. 20 tablet 0 prn at prn  . busPIRone (BUSPAR) 7.5 MG tablet Take 1 tablet (7.5 mg total) by mouth 2 (two)  times daily. (Patient not taking: Reported on 04/06/2020) 180 tablet 0 Not Taking at Unknown time   Home medication reconciliation was completed with the patient.   Scheduled Inpatient Medications:   . enoxaparin (LOVENOX) injection  0.5 mg/kg Subcutaneous Q24H  . ketorolac  30 mg Intravenous Q6H  . [START ON 04/07/2020] levothyroxine  75 mcg Oral Daily  . pantoprazole  40 mg Oral Daily    Continuous Inpatient Infusions:   . sodium chloride 75 mL/hr at 04/06/20 1047  . piperacillin-tazobactam 3.375 g (04/06/20 1222)    PRN Inpatient Medications:  cyclobenzaprine, morphine injection, ondansetron **OR** ondansetron (ZOFRAN) IV  Family History: family history includes Aneurysm in his mother; Cancer in his father; Drug abuse in his maternal uncle and paternal aunt; Mental illness in his sister and sister.   GI Family History: Negative.  Social History:   reports that he has never smoked. He has never used smokeless tobacco. He reports previous alcohol use. He reports previous drug use. The patient denies ETOH, tobacco, or drug use.    Review of Systems: Review of Systems - Negative except that in the HPI  Physical Examination: BP 120/78 (BP Location: Right Arm)   Pulse 78   Temp 97.8 F (36.6 C)   Resp 18   Ht 6\' 1"  (1.854 m)   Wt (!) 158.8 kg   SpO2 99%   BMI 46.18 kg/m  Physical Exam Vitals reviewed.  Constitutional:      General: He is not in acute distress.    Appearance: He is obese. He is diaphoretic. He is not ill-appearing or toxic-appearing.  HENT:     Head: Normocephalic and atraumatic.     Nose: Nose normal.  Eyes:     Pupils: Pupils are equal, round, and reactive to light.  Cardiovascular:     Rate and Rhythm: Normal rate.     Pulses: Normal pulses.  Pulmonary:     Effort: Pulmonary effort is normal.     Breath sounds: Normal breath sounds. No wheezing.  Abdominal:     General: Bowel sounds are normal.     Palpations: Abdomen is soft. There is no  mass.  Musculoskeletal:     Cervical back: Normal range of motion and neck supple.  Lymphadenopathy:     Cervical: No cervical adenopathy.  Neurological:     Mental Status: He is alert.     Data: Lab Results  Component Value Date   WBC 15.0 (H) 04/06/2020   HGB 14.2 04/06/2020   HCT 42.1 04/06/2020   MCV 86.4 04/06/2020   PLT 222 04/06/2020   Recent Labs  Lab 04/06/20 0210  HGB 14.2   Lab Results  Component Value Date   NA 135 04/06/2020   K 3.7 04/06/2020   CL 103 04/06/2020   CO2 20 (L) 04/06/2020   BUN 16 04/06/2020   CREATININE 1.05 04/06/2020   Lab Results  Component Value Date   ALT 18 04/06/2020   AST 22 04/06/2020   ALKPHOS 63 04/06/2020  BILITOT 1.6 (H) 04/06/2020   No results for input(s): APTT, INR, PTT in the last 168 hours. CBC Latest Ref Rng & Units 04/06/2020 10/05/2019 08/04/2018  WBC 4.0 - 10.5 K/uL 15.0(H) 9.9 8.6  Hemoglobin 13.0 - 17.0 g/dL 06.2 69.4 85.4  Hematocrit 39.0 - 52.0 % 42.1 42.3 40.6  Platelets 150 - 400 K/uL 222 205 209    STUDIES: CT ABDOMEN PELVIS W CONTRAST  Result Date: 04/06/2020 CLINICAL DATA:  Diverticulitis diagnosis 3 days ago. Suspected complicated diverticulitis presenting with severe back pain. EXAM: CT ABDOMEN AND PELVIS WITH CONTRAST TECHNIQUE: Multidetector CT imaging of the abdomen and pelvis was performed using the standard protocol following bolus administration of intravenous contrast. CONTRAST:  OMNIPAQUE IOHEXOL 300 MG/ML  SOLN COMPARISON:  Abdomen pelvis 01/11/2020, x-ray abdomen 04/04/2020, MR lumbar spine 11/08/2018 FINDINGS: Lower chest: Subsegmental atelectasis within the left lower lobe. No acute abnormality. Possible tiny hiatal hernia. Hepatobiliary: No focal liver abnormality. 3.5 cm calcified gallstone within the gallbladder lumen. Likely noncalcified gallstone within the lumen of the gallbladder fundus (2:30). No gallbladder wall thickening, or pericholecystic fluid. No biliary dilatation.  Pancreas: Diffusely atrophic. No focal lesion. Otherwise normal pancreatic contour. No surrounding inflammatory changes. No main pancreatic ductal dilatation. Spleen: Normal in size without focal abnormality. A splenule is noted. Adrenals/Urinary Tract: No adrenal nodule bilaterally. No nephroureterolithiasis. Bilateral kidneys enhance symmetrically. No hydronephrosis. No hydroureter. The urinary bladder is unremarkable. Stomach/Bowel: PO contrast reaches the transverse colon. Stomach is within normal limits. No evidence of small bowel wall thickening or dilatation. Scattered sigmoid colon diverticulosis. Short segment bowel wall thickening of the distal sigmoid colon. Associated pericolonic fat stranding appendix appears normal. Vascular/Lymphatic: No abdominal aorta or iliac aneurysm. The aorta is tortuous. No atherosclerotic plaque of the aorta and its branches. No abdominal, pelvic, or inguinal lymphadenopathy. Reproductive: Prostate is unremarkable. Other: No intraperitoneal free fluid. Cannot exclude a couple of tiny foci of intraperitoneal gas along the sigmoid colon (5:63). No organized fluid collection. Musculoskeletal: No abdominal wall hernia or abnormality. No suspicious lytic or blastic osseous lesions. No acute displaced fracture. Multilevel degenerative changes of the spine. IMPRESSION: Sigmoid diverticulosis with acute sigmoid diverticulitis with a microperforation not excluded. No intramural abscess formation or large volume pneumoperitoneum/free fluid. Recommend colonoscopy post treatment and post complete resolution of inflammatory changes to exclude an underlying like density. Electronically Signed   By: Tish Frederickson M.D.   On: 04/06/2020 05:16   @IMAGES @  Assessment: 1. Acute diverticulitis with microperforation of the sigmoid. Self contained perforation without peritoneal signs on exam. Improved clinically from hospital admission and IV antibiotics.  COVID-19 status:   Negative   Recommendations:  1. Diet as per surgery. 2. I have given my office information to the patient and placed on the discharge paperwork. Patient advised to see me in the office 3 weeks after discharge. Plan colonoscopy in 4-6 weeks electively if feasible depending on clinical course. 3. GI will sign off for now. Please alert me in the interim if my assistance is again  needed.  Thank you for the consult. Please call with questions or concerns.  , "Rosina Lowenstein MD [facility] [address] [place], [place] [phone]  04/06/2020 3:40 PM

## 2020-04-06 NOTE — Progress Notes (Signed)
PHARMACIST - PHYSICIAN COMMUNICATION  CONCERNING:  Enoxaparin (Lovenox) for DVT Prophylaxis    RECOMMENDATION: Patient was prescribed enoxaprin 40mg  q24 hours for VTE prophylaxis.   Filed Weights   04/06/20 0201  Weight: (!) 158.8 kg (350 lb)    Body mass index is 46.18 kg/m.  Estimated Creatinine Clearance: 122.4 mL/min (by C-G formula based on SCr of 1.05 mg/dL).   Based on [facility] policy patient is candidate for enoxaparin 0.5mg /kg TBW SQ every 24 hours based on BMI being >30.  DESCRIPTION: Pharmacy has adjusted enoxaparin dose per [facility] policy.  Patient is now receiving enoxaparin 80 mg every 24 hours    [facility], PharmD Clinical Pharmacist  04/06/2020 10:13 AM

## 2020-04-06 NOTE — ED Triage Notes (Signed)
Pt to ED via EMS from home, pt states he was dx with diverticulitis 3 days ago. Pt states tonight while trying to get comfortable in bed he started having sever back pain, pt has hx of back pain but pt states this is the worst his pain has ever been.

## 2020-04-06 NOTE — H&P (Signed)
History and Physical    [name] [name] IWP:[id number] DOB: 1Jun 14, 1962 DOA: 04/06/2020  PCP: [name], NP   Patient coming from: Home  I have personally briefly reviewed patient's old medical records in [facility]  Chief Complaint: Back pain  HPI: [name] [name] is a 60 y.o. male with medical history significant for morbid obesity, diverticulitis, GERD, fibromyalgia and depression who presents to the ER for evaluation of low back pain and abdominal pain.  Patient describes pain in the left lower quadrant, he rated his pain a 6 x 10 in intensity at its worst.  Pain was nonradiating and was associated with diarrhea.  Patient has had pain like this in the past related to his diverticulitis and so he spoke to his primary care provider who prescribed Flagyl and ciprofloxacin which patient is currently on.  Patient states that he was in bed and believes he twisted his back leading to worsening left lower back pain prompting his visit to the ER.  His pain is worse with movement in any form of ambulation.  He denied having any fever or chills.  He denies any incontinence of bladder or bowel.  He has no nausea or vomiting. He denies having any chest pain, no shortness of breath, no palpitations, no diaphoresis, no dizziness, no lightheadedness, no cough, no sore throat, no headache, no blurred vision, no urinary frequency, nocturia, dysuria, no focal deficits, no blurred vision. Labs show sodium 135, potassium 3.7, chloride 103, bicarb 20, glucose 113, BUN 16, creatinine 1.05, calcium 9.2, alkaline phosphatase 63, albumin 4.2, lipase 30, AST 22, ALT 18, total protein 8.8, white count 15, hemoglobin 14.2, hematocrit 42.1, MCV 86.4, RDW 12.5, platelet count 222 Respiratory viral panel is negative CT scan of abdomen and pelvis shows sigmoid diverticulosis with acute sigmoid diverticulitis with a microperforation not excluded. No intramural abscess formation or large volume pneumoperitoneum/free fluid.  Recommend colonoscopy post treatment and post complete resolution of inflammatory changes to exclude an underlying like density.   ED Course: Patient is a 60 year old Caucasian male with a known history of diverticular disease who presents to the ER for evaluation of worsening left lower back pain.  Patient was started on Flagyl and ciprofloxacin as an outpatient for suspected acute diverticulitis.  Imaging shows sigmoid diverticulosis with acute sigmoid diverticulitis, unable to rule out microperforation.  Patient received IV antibiotics in the ER and will be admitted to the hospital for further evaluation.    Review of Systems: As per HPI otherwise all other systems reviewed and negative.    Past Medical History:  Diagnosis Date  . Anxiety   . Arthritis   . Depression   . Diverticulitis   . Fibromyalgia   . GERD (gastroesophageal reflux disease)   . Glaucoma   . Hepatitis C   . Juvenile epilepsy (HCC)   . Positive TB test   . Sleep apnea     Past Surgical History:  Procedure Laterality Date  . LIVER BIOPSY       reports that he has never smoked. He has never used smokeless tobacco. He reports previous alcohol use. He reports previous drug use.  Allergies  Allergen Reactions  . Celecoxib   . Zoloft [Sertraline Hcl] Anxiety    Family History  Problem Relation Age of Onset  . Aneurysm Mother   . Cancer Father   . Mental illness Sister   . Drug abuse Paternal Aunt   . Drug abuse Maternal Uncle   . Mental illness Sister  Prior to Admission medications   Medication Sig Start Date End Date Taking? Authorizing Provider  ciprofloxacin (CIPRO) 500 MG tablet Take 1 tablet (500 mg total) by mouth 2 (two) times daily. 04/04/20  Yes Theotis BurrowHarris, Taylor S, NP  Crisaborole (EUCRISA) 2 % OINT Apply 1 application topically 2 (two) times daily as needed. 02/01/19  Yes Scarboro, Coralee NorthAdam J, NP  levothyroxine (SYNTHROID) 75 MCG tablet Take 1 tablet (75 mcg total) by mouth daily. 03/12/20   Yes McDonough, Lauren K, PA-C  metroNIDAZOLE (FLAGYL) 500 MG tablet Take 1 tablet (500 mg total) by mouth 2 (two) times daily. 04/04/20  Yes Theotis BurrowHarris, Taylor S, NP  naproxen (NAPROSYN) 500 MG tablet TAKE 1 TABLET BY MOUTH TWICE A DAY AS NEEDED 09/26/19  Yes Lyndon CodeKhan, Fozia M, MD  NON FORMULARY cpap device   Yes [provider]  omeprazole (PRILOSEC) 20 MG capsule TAKE 1 CAPSULE BY MOUTH EVERY DAY 02/28/19  Yes Scarboro, Coralee NorthAdam J, NP  ondansetron (ZOFRAN ODT) 4 MG disintegrating tablet Take 1 tablet (4 mg total) by mouth every 8 (eight) hours as needed for nausea or vomiting. 04/04/20  Yes Theotis BurrowHarris, Taylor S, NP  busPIRone (BUSPAR) 7.5 MG tablet Take 1 tablet (7.5 mg total) by mouth 2 (two) times daily. Patient not taking: Reported on 04/06/2020 07/07/19   Johnna AcostaScarboro, Adam J, NP    Physical Exam: Vitals:   04/06/20 0810 04/06/20 0900 04/06/20 0903 04/06/20 0952  BP: 135/76 122/75  (!) 127/56  Pulse:  85 79 75  Resp:   17 18  Temp:    97.8 F (36.6 C)  TempSrc:      SpO2:  91% 96% 99%  Weight:      Height:         Vitals:   04/06/20 0810 04/06/20 0900 04/06/20 0903 04/06/20 0952  BP: 135/76 122/75  (!) 127/56  Pulse:  85 79 75  Resp:   17 18  Temp:    97.8 F (36.6 C)  TempSrc:      SpO2:  91% 96% 99%  Weight:      Height:          Constitutional: Alert and oriented x 3 . Not in any apparent distress.  Morbidly obese HEENT:      Head: Normocephalic and atraumatic.         Eyes: PERLA, EOMI, Conjunctivae are normal. Sclera is non-icteric.       Mouth/Throat: Mucous membranes are moist.       Neck: Supple with no signs of meningismus. Cardiovascular: Regular rate and rhythm. No murmurs, gallops, or rubs. 2+ symmetrical distal pulses are present . No JVD. No LE edema Respiratory: Respiratory effort normal .Lungs sounds clear bilaterally. No wheezes, crackles, or rhonchi.  Gastrointestinal: Soft, tender left lower quadrant, and non distended with positive bowel sounds.   Genitourinary: No CVA tenderness. Musculoskeletal: Nontender with normal range of motion in all extremities. No cyanosis, or erythema of extremities. Neurologic:  Face is symmetric. Moving all extremities. No gross focal neurologic deficits  Skin: Skin is warm, dry.  No rash or ulcers Psychiatric: Mood and affect are normal   Labs on Admission: I have personally reviewed following labs and imaging studies  CBC: Recent Labs  Lab 04/06/20 0210  WBC 15.0*  NEUTROABS 11.3*  HGB 14.2  HCT 42.1  MCV 86.4  PLT 222   Basic Metabolic Panel: Recent Labs  Lab 04/06/20 0210  NA 135  K 3.7  CL 103  CO2 20*  GLUCOSE 113*  BUN 16  CREATININE 1.05  CALCIUM 9.2   GFR: Estimated Creatinine Clearance: 122.4 mL/min (by C-G formula based on SCr of 1.05 mg/dL). Liver Function Tests: Recent Labs  Lab 04/06/20 0210  AST 22  ALT 18  ALKPHOS 63  BILITOT 1.6*  PROT 8.8*  ALBUMIN 4.2   Recent Labs  Lab 04/06/20 0210  LIPASE 30   No results for input(s): AMMONIA in the last 168 hours. Coagulation Profile: No results for input(s): INR, PROTIME in the last 168 hours. Cardiac Enzymes: No results for input(s): CKTOTAL, CKMB, CKMBINDEX, TROPONINI in the last 168 hours. BNP (last 3 results) No results for input(s): PROBNP in the last 8760 hours. HbA1C: No results for input(s): HGBA1C in the last 72 hours. CBG: No results for input(s): GLUCAP in the last 168 hours. Lipid Profile: No results for input(s): CHOL, HDL, LDLCALC, TRIG, CHOLHDL, LDLDIRECT in the last 72 hours. Thyroid Function Tests: No results for input(s): TSH, T4TOTAL, FREET4, T3FREE, THYROIDAB in the last 72 hours. Anemia Panel: No results for input(s): VITAMINB12, FOLATE, FERRITIN, TIBC, IRON, RETICCTPCT in the last 72 hours. Urine analysis:    Component Value Date/Time   APPEARANCEUR Clear 01/19/2020 0320   GLUCOSEU Negative 01/19/2020 0320   BILIRUBINUR Negative 01/19/2020 0320   PROTEINUR Negative 01/19/2020  0320   UROBILINOGEN 0.2 01/03/2020 1412   NITRITE Negative 01/19/2020 0320   LEUKOCYTESUR Negative 01/19/2020 0320    Radiological Exams on Admission: CT ABDOMEN PELVIS W CONTRAST  Result Date: 04/06/2020 CLINICAL DATA:  Diverticulitis diagnosis 3 days ago. Suspected complicated diverticulitis presenting with severe back pain. EXAM: CT ABDOMEN AND PELVIS WITH CONTRAST TECHNIQUE: Multidetector CT imaging of the abdomen and pelvis was performed using the standard protocol following bolus administration of intravenous contrast. CONTRAST:  OMNIPAQUE IOHEXOL 300 MG/ML  SOLN COMPARISON:  Abdomen pelvis 01/11/2020, x-ray abdomen 04/04/2020, MR lumbar spine 11/08/2018 FINDINGS: Lower chest: Subsegmental atelectasis within the left lower lobe. No acute abnormality. Possible tiny hiatal hernia. Hepatobiliary: No focal liver abnormality. 3.5 cm calcified gallstone within the gallbladder lumen. Likely noncalcified gallstone within the lumen of the gallbladder fundus (2:30). No gallbladder wall thickening, or pericholecystic fluid. No biliary dilatation. Pancreas: Diffusely atrophic. No focal lesion. Otherwise normal pancreatic contour. No surrounding inflammatory changes. No main pancreatic ductal dilatation. Spleen: Normal in size without focal abnormality. A splenule is noted. Adrenals/Urinary Tract: No adrenal nodule bilaterally. No nephroureterolithiasis. Bilateral kidneys enhance symmetrically. No hydronephrosis. No hydroureter. The urinary bladder is unremarkable. Stomach/Bowel: PO contrast reaches the transverse colon. Stomach is within normal limits. No evidence of small bowel wall thickening or dilatation. Scattered sigmoid colon diverticulosis. Short segment bowel wall thickening of the distal sigmoid colon. Associated pericolonic fat stranding appendix appears normal. Vascular/Lymphatic: No abdominal aorta or iliac aneurysm. The aorta is tortuous. No atherosclerotic plaque of the aorta and its  branches. No abdominal, pelvic, or inguinal lymphadenopathy. Reproductive: Prostate is unremarkable. Other: No intraperitoneal free fluid. Cannot exclude a couple of tiny foci of intraperitoneal gas along the sigmoid colon (5:63). No organized fluid collection. Musculoskeletal: No abdominal wall hernia or abnormality. No suspicious lytic or blastic osseous lesions. No acute displaced fracture. Multilevel degenerative changes of the spine. IMPRESSION: Sigmoid diverticulosis with acute sigmoid diverticulitis with a microperforation not excluded. No intramural abscess formation or large volume pneumoperitoneum/free fluid. Recommend colonoscopy post treatment and post complete resolution of inflammatory changes to exclude an underlying like density. Electronically Signed   By: Tish Frederickson M.D.   On: 04/06/2020 05:16  Assessment/Plan Principal Problem:   Acute diverticulitis Active Problems:   Hypothyroidism   GERD (gastroesophageal reflux disease)   Depression   Obesity, Class III, BMI 40-49.9 (morbid obesity) (HCC)   OSA (obstructive sleep apnea)     Acute diverticulitis Patient presents for evaluation of left lower quadrant pain similar to his prior episodes of acute diverticulitis associated with diarrhea He has leukocytosis of 15,000 with a left shift CT scan of abdomen and pelvis shows sigmoid diverticulosis with acute sigmoid diverticulitis unable to rule out microperforation We will treat patient empirically with Zosyn Pain control We will consult GI as colonoscopy post resolution has been recommended Place patient on a clear liquid diet Patient will need dietary consult prior to discharge to get recommendations for diet appropriate for patients with diverticular disease since he has frequent flares    Morbid obesity (BMI 46) Complicates overall prognosis and care    Hypothyroidism Continue Synthroid    Obstructive sleep apnea Continue CPAP at  bedtime    GERD Continue Protonix    DVT prophylaxis: Lovenox Code Status: Full code Family Communication: Greater than 50% of time was spent discussing patient's condition and plan of care with him at the bedside.  All questions and concerns have been addressed.  He verbalizes understanding and agrees to the plan. Disposition Plan: Back to previous home environment Consults called: Gastroenterology Status: Inpatient.  The medical decision making for this patient was of high complexity and patient is at high risk for clinical deterioration during this hospitalization.    Lucile Shutters MD [facility]     04/06/2020, 10:14 AM

## 2020-04-06 NOTE — Progress Notes (Signed)
Pt's CPAP home unit was checked out. It appears to be in good working order without frays in the cord. [employer] was contacted.

## 2020-04-06 NOTE — ED Provider Notes (Signed)
[facility] Emergency Department Provider Note  ____________________________________________  [facility]: Approximately 5[facility]45 [facility]  I have reviewed the triage vital signs and the nursing notes.   HISTORY  Chief Complaint Back Pain   HPI [name] [name] is a 60 y.o. male who presents for evaluation of lower back pain.  Patient started having abdominal pain and diarrhea 2 days ago.  Spoke with his primary care doctor who felt patient had diverticulitis and started patient on Flagyl and Cipro.   He has only taken 1 day of the antibiotics. patient reports that tonight he was trying to maneuver in bed and was having some difficulty because of the lower abd pain when he twisted his back and developed severe left lower back pain and that is what brought him to the ER.  He reports that the pain is worse with movement and ambulation.  He reports history of chronic back pain but never this severe.  He has had no fever or chills.  He is complaining of unchanged suprapubic abdominal pain for the last 2 days.  The diarrhea has improved.  No nausea or vomiting.  Past Medical History:  Diagnosis Date  . Anxiety   . Arthritis   . Depression   . Diverticulitis   . Fibromyalgia   . GERD (gastroesophageal reflux disease)   . Glaucoma   . Hepatitis C   . Juvenile epilepsy (HCC)   . Positive TB test   . Sleep apnea     Patient Active Problem List   Diagnosis Date Noted  . Acute diverticulitis 09/28/2019  . Leukocytosis 09/28/2019  . Lumbar disc disease with radiculopathy 09/28/2019  . Calculus of gallbladder without cholecystitis without obstruction 09/28/2019  . Encounter for general adult medical examination with abnormal findings 12/18/2018  . Hypothyroidism 12/18/2018  . Boil 12/18/2018  . Rash 12/18/2018  . Dysuria 12/18/2018  . PTSD (post-traumatic stress disorder) 10/05/2018  . MDD (major depressive disorder), recurrent episode, moderate (HCC) 10/05/2018    Past  Surgical History:  Procedure Laterality Date  . LIVER BIOPSY      Prior to Admission medications   Medication Sig Start Date End Date Taking? Authorizing Provider  busPIRone (BUSPAR) 7.5 MG tablet Take 1 tablet (7.5 mg total) by mouth 2 (two) times daily. 07/07/19   Johnna AcostaScarboro, Adam J, NP  ciprofloxacin (CIPRO) 500 MG tablet Take 1 tablet (500 mg total) by mouth 2 (two) times daily. 04/04/20   Theotis BurrowHarris, Taylor S, NP  Crisaborole (EUCRISA) 2 % OINT Apply 1 application topically 2 (two) times daily as needed. 02/01/19   Johnna AcostaScarboro, Adam J, NP  levothyroxine (SYNTHROID) 75 MCG tablet Take 1 tablet (75 mcg total) by mouth daily. 03/12/20   McDonough, Salomon FickLauren K, PA-C  metroNIDAZOLE (FLAGYL) 500 MG tablet Take 1 tablet (500 mg total) by mouth 2 (two) times daily. 04/04/20   Theotis BurrowHarris, Taylor S, NP  naproxen (NAPROSYN) 500 MG tablet TAKE 1 TABLET BY MOUTH TWICE A DAY AS NEEDED 09/26/19   Lyndon CodeKhan, Fozia M, MD  NON FORMULARY cpap device    [provider]  omeprazole (PRILOSEC) 20 MG capsule TAKE 1 CAPSULE BY MOUTH EVERY DAY 02/28/19   Scarboro, Coralee NorthAdam J, NP  ondansetron (ZOFRAN ODT) 4 MG disintegrating tablet Take 1 tablet (4 mg total) by mouth every 8 (eight) hours as needed for nausea or vomiting. 04/04/20   Theotis BurrowHarris, Taylor S, NP    Allergies Celecoxib, Tramadol, and Zoloft [sertraline hcl]  Family History  Problem Relation Age of  Onset  . Aneurysm Mother   . Cancer Father   . Mental illness Sister   . Drug abuse Paternal Aunt   . Drug abuse Maternal Uncle   . Mental illness Sister     Social History Social History   Tobacco Use  . Smoking status: Never Smoker  . Smokeless tobacco: Never Used  Vaping Use  . Vaping Use: Never used  Substance Use Topics  . Alcohol use: Not Currently  . Drug use: Not Currently    Review of Systems  Constitutional: Negative for fever. Eyes: Negative for visual changes. ENT: Negative for sore throat. Neck: No neck pain  Cardiovascular: Negative for chest  pain. Respiratory: Negative for shortness of breath. Gastrointestinal: + suprapubic abdominal pain. No vomiting or diarrhea. Genitourinary: Negative for dysuria. Musculoskeletal: + lower back pain. Skin: Negative for rash. Neurological: Negative for headaches, weakness or numbness. Psych: No SI or HI  ____________________________________________   PHYSICAL EXAM:  VITAL SIGNS: ED Triage Vitals  Enc Vitals Group     BP 04/06/20 0203 102/87     Pulse Rate 04/06/20 0203 92     Resp 04/06/20 0203 20     Temp 04/06/20 0203 98.6 F (37 C)     Temp src --      SpO2 04/06/20 0203 100 %     Weight 04/06/20 0201 (!) 350 lb (158.8 kg)     Height 04/06/20 0201 6\' 1"  (1.854 m)     Head Circumference --      Peak Flow --      Pain Score 04/06/20 0201 10     Pain Loc --      Pain Edu? --      Excl. in GC? --     Constitutional: Alert and oriented. Well appearing and in no apparent distress. HEENT:      Head: Normocephalic and atraumatic.         Eyes: Conjunctivae are normal. Sclera is non-icteric.       Mouth/Throat: Mucous membranes are moist.       Neck: Supple with no signs of meningismus. Cardiovascular: Regular rate and rhythm. No murmurs, gallops, or rubs. 2+ symmetrical distal pulses are present in all extremities. No JVD. Respiratory: Normal respiratory effort. Lungs are clear to auscultation bilaterally. No wheezes, crackles, or rhonchi.  Gastrointestinal: Soft, suprapubic tenderness to palpation, and non distended with positive bowel sounds. No rebound or guarding. Musculoskeletal: No midline CT no spine tenderness, no paraspinal tenderness. Neurologic: Normal speech and language. Face is symmetric. Moving all extremities. No gross focal neurologic deficits are appreciated. Skin: Skin is warm, dry and intact. No rash noted. Psychiatric: Mood and affect are normal. Speech and behavior are normal.  ____________________________________________   LABS (all labs ordered are  listed, but only abnormal results are displayed)  Labs Reviewed  CBC WITH DIFFERENTIAL/PLATELET - Abnormal; Notable for the following components:      Result Value   WBC 15.0 (*)    Neutro Abs 11.3 (*)    Monocytes Absolute 1.4 (*)    Abs Immature Granulocytes 0.10 (*)    All other components within normal limits  COMPREHENSIVE METABOLIC PANEL - Abnormal; Notable for the following components:   CO2 20 (*)    Glucose, Bld 113 (*)    Total Protein 8.8 (*)    Total Bilirubin 1.6 (*)    All other components within normal limits  RESP PANEL BY RT-PCR (FLU A&B, COVID) ARPGX2  LIPASE, BLOOD  URINALYSIS, ROUTINE  W REFLEX MICROSCOPIC   ____________________________________________  EKG  none  ____________________________________________  RADIOLOGY  I have personally reviewed the images performed during this visit and I agree with the Radiologist's read.   Interpretation by Radiologist:  CT ABDOMEN PELVIS W CONTRAST  Result Date: 04/06/2020 CLINICAL DATA:  Diverticulitis diagnosis 3 days ago. Suspected complicated diverticulitis presenting with severe back pain. EXAM: CT ABDOMEN AND PELVIS WITH CONTRAST TECHNIQUE: Multidetector CT imaging of the abdomen and pelvis was performed using the standard protocol following bolus administration of intravenous contrast. CONTRAST:  OMNIPAQUE IOHEXOL 300 MG/ML  SOLN COMPARISON:  Abdomen pelvis 01/11/2020, x-ray abdomen 04/04/2020, MR lumbar spine 11/08/2018 FINDINGS: Lower chest: Subsegmental atelectasis within the left lower lobe. No acute abnormality. Possible tiny hiatal hernia. Hepatobiliary: No focal liver abnormality. 3.5 cm calcified gallstone within the gallbladder lumen. Likely noncalcified gallstone within the lumen of the gallbladder fundus (2:30). No gallbladder wall thickening, or pericholecystic fluid. No biliary dilatation. Pancreas: Diffusely atrophic. No focal lesion. Otherwise normal pancreatic contour. No surrounding inflammatory  changes. No main pancreatic ductal dilatation. Spleen: Normal in size without focal abnormality. A splenule is noted. Adrenals/Urinary Tract: No adrenal nodule bilaterally. No nephroureterolithiasis. Bilateral kidneys enhance symmetrically. No hydronephrosis. No hydroureter. The urinary bladder is unremarkable. Stomach/Bowel: PO contrast reaches the transverse colon. Stomach is within normal limits. No evidence of small bowel wall thickening or dilatation. Scattered sigmoid colon diverticulosis. Short segment bowel wall thickening of the distal sigmoid colon. Associated pericolonic fat stranding appendix appears normal. Vascular/Lymphatic: No abdominal aorta or iliac aneurysm. The aorta is tortuous. No atherosclerotic plaque of the aorta and its branches. No abdominal, pelvic, or inguinal lymphadenopathy. Reproductive: Prostate is unremarkable. Other: No intraperitoneal free fluid. Cannot exclude a couple of tiny foci of intraperitoneal gas along the sigmoid colon (5:63). No organized fluid collection. Musculoskeletal: No abdominal wall hernia or abnormality. No suspicious lytic or blastic osseous lesions. No acute displaced fracture. Multilevel degenerative changes of the spine. IMPRESSION: Sigmoid diverticulosis with acute sigmoid diverticulitis with a microperforation not excluded. No intramural abscess formation or large volume pneumoperitoneum/free fluid. Recommend colonoscopy post treatment and post complete resolution of inflammatory changes to exclude an underlying like density. Electronically Signed   By: Tish Frederickson M.D.   On: 04/06/2020 05:16     ____________________________________________   PROCEDURES  Procedure(s) performed: None Procedures Critical Care performed:  None ____________________________________________   INITIAL IMPRESSION / ASSESSMENT AND PLAN / ED COURSE   60 y.o. male who presents for evaluation of lower back pain and lower abd pain.  Patient looks uncomfortable due  to pain and moving with certain difficulty.  Palpation of his back does not reproduce the pain.  He also has mild suprapubic tenderness with no rebound or guarding.  I visualized the CT done in the waiting room consistent with sigmoid diverticulitis, read confirmed by radiology who also is concerned for a microperforation.  Patient does have a white count of 15 but otherwise no fever or signs of sepsis.  Will cover with IV Zosyn and discussed with surgery for admission.  I did speak with the radiologist Dr. Tish Frederickson and there are no acute abnormalities of the lumbar spine on the CT per her only DJD changes.  Pain seems to be more of a muscle strain.  Review of the records including patient's consultation via the phone with his PCP 2 days ago for the lower abdominal pain.   _________________________ 6:05 AM on 04/06/2020 -----------------------------------------  Discussed with Dr. [name] from surgery who recommended  admission to the Hospitalist group for IV abx and surgical consult.       _____________________________________________ Please note:  Patient was evaluated in Emergency Department today for the symptoms described in the history of present illness. Patient was evaluated in the context of the global COVID-19 pandemic, which necessitated consideration that the patient might be at risk for infection with the SARS-CoV-2 virus that causes COVID-19. Institutional protocols and algorithms that pertain to the evaluation of patients at risk for COVID-19 are in a state of rapid change based on information released by regulatory bodies including the CDC and federal and state organizations. These policies and algorithms were followed during the patient's care in the ED.  Some ED evaluations and interventions may be delayed as a result of limited staffing during the pandemic.   [place] Controlled Substance Database was reviewed by me. ____________________________________________   FINAL CLINICAL  IMPRESSION(S) / ED DIAGNOSES   Final diagnoses:  Diverticulitis of large intestine with perforation without abscess or bleeding  Strain of lumbar region, initial encounter      NEW MEDICATIONS STARTED DURING THIS VISIT:  ED Discharge Orders    None       Note:  This document was prepared using Dragon voice recognition software and may include unintentional dictation errors.    Nita Sickle, MD 04/06/20 [phone]

## 2020-04-06 NOTE — Progress Notes (Signed)
PHARMACY NOTE:  ANTIMICROBIAL RENAL DOSAGE ADJUSTMENT  Current antimicrobial regimen includes a mismatch between antimicrobial dosage and estimated renal function.  As per policy approved by the Pharmacy & Therapeutics and Medical Executive Committees, the antimicrobial dosage will be adjusted accordingly.  Current antimicrobial dosage:  Zosyn 3.375g IV q6h (30 min infusion)  Indication: Intra-abdominal infection  Renal Function:  Estimated Creatinine Clearance: 122.4 mL/min (by C-G formula based on SCr of 1.05 mg/dL). []      On intermittent HD, scheduled: []      On CRRT    Antimicrobial dosage has been changed to:  Zosyn 3.375g IV q8h (4 hr infusion)  Additional comments:   Thank you for allowing pharmacy to be a part of this patient's care.  , [facility] 04/06/2020 10:13 AM

## 2020-04-06 NOTE — Consult Note (Signed)
Patient ID: [name] [name], male   DOB: [birth year], 60 y.o.   MRN: [id number]  HPI [name] is a 60 y.o. male seen in consultation at the request of Dr. Don Perking Case discussed with her in detail.., Seen with 2-day history of abdominal pain that is intermittent mild to moderate intensity and also some loose bowel movements.  The pain is intermittent colicky type.  He has been placed on antibiotics for diverticulitis Cipro and Flagyl.  He has also significant back pain that now has exacerbated.  He denies any fevers any chills no throwing up.  He reports that this is the third episode of acute diverticulitis.  They all been treated medically and without hospital admission.  He does have a history of morbid obesity with a BMI of forty-six.  No prior major abdominal operations.  He does have a history of prior hepatitis C and some questionable liver dysfunction.  He did have a CT scan of the abdomen pelvis that I personally reviewed showing evidence of some diverticulitis with microperforation but no evidence of overt free air.  No collections.  No evidence of ascites or cirrhosis. White count is 15,000 hemoglobin is fourteen platelets 222. CMP is normal except a total bilirubin of 1.6. He occasionally uses a cane for balance.  HPI  Past Medical History:  Diagnosis Date  . Anxiety   . Arthritis   . Depression   . Diverticulitis   . Fibromyalgia   . GERD (gastroesophageal reflux disease)   . Glaucoma   . Hepatitis C   . Juvenile epilepsy (HCC)   . Positive TB test   . Sleep apnea     Past Surgical History:  Procedure Laterality Date  . LIVER BIOPSY      Family History  Problem Relation Age of Onset  . Aneurysm Mother   . Cancer Father   . Mental illness Sister   . Drug abuse Paternal Aunt   . Drug abuse Maternal Uncle   . Mental illness Sister     Social History Social History   Tobacco Use  . Smoking status: Never Smoker  . Smokeless tobacco: Never Used  Vaping Use  . Vaping  Use: Never used  Substance Use Topics  . Alcohol use: Not Currently  . Drug use: Not Currently    Allergies  Allergen Reactions  . Celecoxib   . Zoloft [Sertraline Hcl] Anxiety    Current Facility-Administered Medications  Medication Dose Route Frequency Provider Last Rate Last Admin  . 0.9 %  sodium chloride infusion   Intravenous Continuous Agbata, Tochukwu, MD      . enoxaparin (LOVENOX) injection 80 mg  0.5 mg/kg Subcutaneous Q24H Andris Baumann, MD      . Melene Muller ON 04/07/2020] levothyroxine (SYNTHROID) tablet 75 mcg  75 mcg Oral Daily Agbata, Tochukwu, MD      . morphine 2 MG/ML injection 2 mg  2 mg Intravenous Q4H PRN Agbata, Tochukwu, MD      . ondansetron (ZOFRAN) tablet 4 mg  4 mg Oral Q6H PRN Agbata, Tochukwu, MD       Or  . ondansetron (ZOFRAN) injection 4 mg  4 mg Intravenous Q6H PRN Agbata, Tochukwu, MD      . pantoprazole (PROTONIX) EC tablet 40 mg  40 mg Oral Daily Agbata, Tochukwu, MD      . piperacillin-tazobactam (ZOSYN) IVPB 3.375 g  3.375 g Intravenous Q8H Andris Baumann, MD         Review of Systems Full  ROS  was asked and was negative except for the information on the HPI  Physical Exam Blood pressure (!) 127/56, pulse 75, temperature 97.8 F (36.6 C), resp. rate 18, height 6\' 1"  (1.854 m), weight (!) 158.8 kg, SpO2 99 %. CONSTITUTIONAL: NAD BMI 46 EYES: Pupils are equal, round,  Sclera are non-icteric. EARS, NOSE, MOUTH AND THROAT: He is wearing a mask. Hearing is intact to voice. LYMPH NODES:  Lymph nodes in the neck are normal. RESPIRATORY:  Lungs are clear. There is normal respiratory effort, with equal breath sounds bilaterally, and without pathologic use of accessory muscles. CARDIOVASCULAR: Heart is regular without murmurs, gallops, or rubs. GI: The abdomen is soft, minimal tenderness LLQ, bno peritonitis GU: Rectal deferred.   MUSCULOSKELETAL: Normal muscle strength and tone. No cyanosis or edema.   SKIN: Turgor is good and there are no  pathologic skin lesions or ulcers. NEUROLOGIC: Motor and sensation is grossly normal. Cranial nerves are grossly intact. PSYCH:  Oriented to person, place and time. Affect is normal.  Data Reviewed  I have personally reviewed the patient's imaging, laboratory findings and medical records.    Assessment/Plan 60 year old male super morbid obesity now presents with acute diverticulitis with microperforation.  No peritoneal signs not toxic.  Recommend medical management with IV antibiotics and serial abdominal exams.  We will continue to follow.  No need for surgical intervention.  Given his microperforation and recurrence of diverticulitis I do think that he will be reasonable candidate for elective outpatient sigmoid colectomy.  Before we perform this he will need full work-up to include colonoscopy and weight optimization.  ExTensive discussion with the patient regarding his condition was performed. Regarding his back pain I have added Flexeril and Toradol. Also recheck a CMP tomorrow to trend the bilirubin there was mildly elevated  60, MD FACS General Surgeon 04/06/2020, 10:18 AM

## 2020-04-07 DIAGNOSIS — K5792 Diverticulitis of intestine, part unspecified, without perforation or abscess without bleeding: Secondary | ICD-10-CM | POA: Diagnosis not present

## 2020-04-07 LAB — COMPREHENSIVE METABOLIC PANEL
ALT: 16 U/L (ref 0–44)
AST: 18 U/L (ref 15–41)
Albumin: 3.3 g/dL — ABNORMAL LOW (ref 3.5–5.0)
Alkaline Phosphatase: 47 U/L (ref 38–126)
Anion gap: 6 (ref 5–15)
BUN: 16 mg/dL (ref 6–20)
CO2: 28 mmol/L (ref 22–32)
Calcium: 8.3 mg/dL — ABNORMAL LOW (ref 8.9–10.3)
Chloride: 104 mmol/L (ref 98–111)
Creatinine, Ser: 0.9 mg/dL (ref 0.61–1.24)
GFR, Estimated: >60 mL/min (ref >60)
Glucose, Bld: 92 mg/dL (ref 70–99)
Potassium: 3.8 mmol/L (ref 3.5–5.1)
Sodium: 138 mmol/L (ref 135–145)
Total Bilirubin: 1.5 mg/dL — ABNORMAL HIGH (ref 0.3–1.2)
Total Protein: 7.4 g/dL (ref 6.5–8.1)

## 2020-04-07 LAB — CBC
HCT: 38.1 % — ABNORMAL LOW (ref 39.0–52.0)
Hemoglobin: 12.5 g/dL — ABNORMAL LOW (ref 13.0–17.0)
MCH: 29.3 pg (ref 26.0–34.0)
MCHC: 32.8 g/dL (ref 30.0–36.0)
MCV: 89.2 fL (ref 80.0–100.0)
Platelets: 175 10*3/uL (ref 150–400)
RBC: 4.27 MIL/uL (ref 4.22–5.81)
RDW: 12.5 % (ref 11.5–15.5)
WBC: 8.6 10*3/uL (ref 4.0–10.5)
nRBC: 0 % (ref 0.0–0.2)

## 2020-04-07 LAB — HIV ANTIBODY (ROUTINE TESTING W REFLEX): HIV Screen 4th Generation wRfx: NONREACTIVE

## 2020-04-07 NOTE — Progress Notes (Signed)
PROGRESS NOTE    [name] [name]  UVO:[id number] DOB: 1960-12-13 DOA: 04/06/2020 PCP: [name], NP    Brief Narrative:  [name] [name] is a 60 y.o. male with medical history significant for morbid obesity, diverticulitis, GERD, fibromyalgia and depression who presents to the ER for evaluation of low back pain and abdominal pain  2/19-+bm. Advancing to full liquid diet . Less back pain. No abd pain. Consultants:   Surgery, GI  Procedures:   Antimicrobials:    zosyn 2/18   Subjective: Feeling better, had bm. Less back pain  Objective: Vitals:   04/07/20 0033 04/07/20 0531 04/07/20 0735 04/07/20 1131  BP: 108/65 112/60 108/72 112/74  Pulse: (!) 58 62 67 67  Resp: 16 16 14 16   Temp: 98 F (36.7 C) (!) 97.5 F (36.4 C) 98.3 F (36.8 C) 98.1 F (36.7 C)  TempSrc: Oral Oral Oral Oral  SpO2: 99% 98% 92% 95%  Weight:      Height:        Intake/Output Summary (Last 24 hours) at 04/07/2020 1237 Last data filed at 04/07/2020 0416 Gross per 24 hour  Intake 1678.81 ml  Output 800 ml  Net 878.81 ml   Filed Weights   04/06/20 0201  Weight: (!) 158.8 kg    Examination:  General exam: Appears calm and comfortable  Respiratory system: Clear to auscultation. Respiratory effort normal. Cardiovascular system: S1 & S2 heard, RRR. No JVD, murmurs, rubs, gallops or clicks.  Gastrointestinal system: Abdomen is nondistended, soft and nontender. Normal bowel sounds heard. Central nervous system: Alert and oriented. No focal neurological deficits. Extremities: No edema Skin: Warm dry Psychiatry: Judgement and insight appear normal. Mood & affect appropriate.     Data Reviewed: I have personally reviewed following labs and imaging studies  CBC: Recent Labs  Lab 04/06/20 0210 04/07/20 0331  WBC 15.0* 8.6  NEUTROABS 11.3*  --   HGB 14.2 12.5*  HCT 42.1 38.1*  MCV 86.4 89.2  PLT 222 175   Basic Metabolic Panel: Recent Labs  Lab 04/06/20 0210 04/07/20 0331  NA 135  138  K 3.7 3.8  CL 103 104  CO2 20* 28  GLUCOSE 113* 92  BUN 16 16  CREATININE 1.05 0.90  CALCIUM 9.2 8.3*   GFR: Estimated Creatinine Clearance: 142.8 mL/min (by C-G formula based on SCr of 0.9 mg/dL). Liver Function Tests: Recent Labs  Lab 04/06/20 0210 04/07/20 0331  AST 22 18  ALT 18 16  ALKPHOS 63 47  BILITOT 1.6* 1.5*  PROT 8.8* 7.4  ALBUMIN 4.2 3.3*   Recent Labs  Lab 04/06/20 0210  LIPASE 30   No results for input(s): AMMONIA in the last 168 hours. Coagulation Profile: No results for input(s): INR, PROTIME in the last 168 hours. Cardiac Enzymes: No results for input(s): CKTOTAL, CKMB, CKMBINDEX, TROPONINI in the last 168 hours. BNP (last 3 results) No results for input(s): PROBNP in the last 8760 hours. HbA1C: No results for input(s): HGBA1C in the last 72 hours. CBG: No results for input(s): GLUCAP in the last 168 hours. Lipid Profile: No results for input(s): CHOL, HDL, LDLCALC, TRIG, CHOLHDL, LDLDIRECT in the last 72 hours. Thyroid Function Tests: No results for input(s): TSH, T4TOTAL, FREET4, T3FREE, THYROIDAB in the last 72 hours. Anemia Panel: No results for input(s): VITAMINB12, FOLATE, FERRITIN, TIBC, IRON, RETICCTPCT in the last 72 hours. Sepsis Labs: No results for input(s): PROCALCITON, LATICACIDVEN in the last 168 hours.  Recent Results (from the past 240 hour(s))  Resp  Panel by RT-PCR (Flu A&B, Covid) Nasopharyngeal Swab     Status: None   Collection Time: 04/06/20  5:55 AM   Specimen: Nasopharyngeal Swab; Nasopharyngeal(NP) swabs in vial transport medium  Result Value Ref Range Status   SARS Coronavirus 2 by RT PCR NEGATIVE NEGATIVE Final    Comment: (NOTE) SARS-CoV-2 target nucleic acids are NOT DETECTED.  The SARS-CoV-2 RNA is generally detectable in upper respiratory specimens during the acute phase of infection. The lowest concentration of SARS-CoV-2 viral copies this assay can detect is 138 copies/mL. A negative result does not  preclude SARS-Cov-2 infection and should not be used as the sole basis for treatment or other patient management decisions. A negative result may occur with  improper specimen collection/handling, submission of specimen other than nasopharyngeal swab, presence of viral mutation(s) within the areas targeted by this assay, and inadequate number of viral copies(<138 copies/mL). A negative result must be combined with clinical observations, patient history, and epidemiological information. The expected result is Negative.  Fact Sheet for Patients:  BloggerCourse.comhttps://www.fda.gov/media/152166/download  Fact Sheet for Healthcare Providers:  SeriousBroker.ithttps://www.fda.gov/media/152162/download  This test is no t yet approved or cleared by the [place] FDA and  has been authorized for detection and/or diagnosis of SARS-CoV-2 by FDA under an Emergency Use Authorization (EUA). This EUA will remain  in effect (meaning this test can be used) for the duration of the COVID-19 declaration under Section 564(b)(1) of the Act, 21 U.S.C.section 360bbb-3(b)(1), unless the authorization is terminated  or revoked sooner.       Influenza A by PCR NEGATIVE NEGATIVE Final   Influenza B by PCR NEGATIVE NEGATIVE Final    Comment: (NOTE) The Xpert Xpress SARS-CoV-2/FLU/RSV plus assay is intended as an aid in the diagnosis of influenza from Nasopharyngeal swab specimens and should not be used as a sole basis for treatment. Nasal washings and aspirates are unacceptable for Xpert Xpress SARS-CoV-2/FLU/RSV testing.  Fact Sheet for Patients: BloggerCourse.comhttps://www.fda.gov/media/152166/download  Fact Sheet for Healthcare Providers: SeriousBroker.ithttps://www.fda.gov/media/152162/download  This test is not yet approved or cleared by the [place] FDA and has been authorized for detection and/or diagnosis of SARS-CoV-2 by FDA under an Emergency Use Authorization (EUA). This EUA will remain in effect (meaning this test can be used) for the  duration of the COVID-19 declaration under Section 564(b)(1) of the Act, 21 U.S.C. section 360bbb-3(b)(1), unless the authorization is terminated or revoked.  Performed at [facility], [address] [place], [place] [address]          Radiology Studies: CT ABDOMEN PELVIS W CONTRAST  Result Date: 04/06/2020 CLINICAL DATA:  Diverticulitis diagnosis 3 days ago. Suspected complicated diverticulitis presenting with severe back pain. EXAM: CT ABDOMEN AND PELVIS WITH CONTRAST TECHNIQUE: Multidetector CT imaging of the abdomen and pelvis was performed using the standard protocol following bolus administration of intravenous contrast. CONTRAST:  125mL OMNIPAQUE IOHEXOL 300 MG/ML  SOLN COMPARISON:  Abdomen pelvis 01/11/2020, x-ray abdomen 04/04/2020, MR lumbar spine 11/08/2018 FINDINGS: Lower chest: Subsegmental atelectasis within the left lower lobe. No acute abnormality. Possible tiny hiatal hernia. Hepatobiliary: No focal liver abnormality. 3.5 cm calcified gallstone within the gallbladder lumen. Likely noncalcified gallstone within the lumen of the gallbladder fundus (2:30). No gallbladder wall thickening, or pericholecystic fluid. No biliary dilatation. Pancreas: Diffusely atrophic. No focal lesion. Otherwise normal pancreatic contour. No surrounding inflammatory changes. No main pancreatic ductal dilatation. Spleen: Normal in size without focal abnormality. A splenule is noted. Adrenals/Urinary Tract: No adrenal nodule bilaterally. No nephroureterolithiasis. Bilateral kidneys enhance  symmetrically. No hydronephrosis. No hydroureter. The urinary bladder is unremarkable. Stomach/Bowel: PO contrast reaches the transverse colon. Stomach is within normal limits. No evidence of small bowel wall thickening or dilatation. Scattered sigmoid colon diverticulosis. Short segment bowel wall thickening of the distal sigmoid colon. Associated pericolonic fat stranding appendix appears normal.  Vascular/Lymphatic: No abdominal aorta or iliac aneurysm. The aorta is tortuous. No atherosclerotic plaque of the aorta and its branches. No abdominal, pelvic, or inguinal lymphadenopathy. Reproductive: Prostate is unremarkable. Other: No intraperitoneal free fluid. Cannot exclude a couple of tiny foci of intraperitoneal gas along the sigmoid colon (5:63). No organized fluid collection. Musculoskeletal: No abdominal wall hernia or abnormality. No suspicious lytic or blastic osseous lesions. No acute displaced fracture. Multilevel degenerative changes of the spine. IMPRESSION: Sigmoid diverticulosis with acute sigmoid diverticulitis with a microperforation not excluded. No intramural abscess formation or large volume pneumoperitoneum/free fluid. Recommend colonoscopy post treatment and post complete resolution of inflammatory changes to exclude an underlying like density. Electronically Signed   By: Tish Frederickson M.D.   On: 04/06/2020 05:16        Scheduled Meds: . enoxaparin (LOVENOX) injection  0.5 mg/kg Subcutaneous Q24H  . ketorolac  30 mg Intravenous Q6H  . levothyroxine  75 mcg Oral Daily  . pantoprazole  40 mg Oral Daily   Continuous Infusions: . sodium chloride 75 mL/hr at 04/07/20 0612  . piperacillin-tazobactam 3.375 g (04/07/20 0617)    Assessment & Plan:   Principal Problem:   Acute diverticulitis Active Problems:   Hypothyroidism   GERD (gastroesophageal reflux disease)   Depression   Obesity, Class III, BMI 40-49.9 (morbid obesity) (HCC)   OSA (obstructive sleep apnea)   Acute diverticulitis with perforation Patient presents for evaluation of left lower quadrant pain similar to his prior episodes of acute diverticulitis associated with diarrhea 2/19-leukocytosis improved  he has leukocytosis of 15,000 with a left shift GI and surgery following .Per surgery diet advanced to full liquid and advance as tolerated If he finally tolerates p.o. intake will transition to  ciprofloxacin and metronidazole x3 weeks on discharge After discharge he needs to follow-up with Dr. [name] to discuss risk versus benefit of elective sigmoid colectomy as outpatient Follow-up with GI Dr. [name] in 3 weeks after discharge for planned colonoscopy in 4 to 6 weeks electively if feasible depending on clinical course For now we will continue IV Zosyn     Morbid obesity (BMI 46) Complicates overall prognosis and care    Hypothyroidism Continue Synthroid    Obstructive sleep apnea Continue on CPAP    GERD Continue Protonix    DVT prophylaxis: Lovenox Code Status: Full Family Communication: None at bedside  Status is: Inpatient  Remains inpatient appropriate because:IV treatments appropriate due to intensity of illness or inability to take PO   Dispo: The patient is from: Home              Anticipated d/c is to: Home              Anticipated d/c date is: 1 day              Patient currently is not medically stable to d/c.needs iv abx, also needs to see if tolerates advancing diet   Difficult to place patient No            LOS: 1 day   Time spent: 35 min with >50% on coc    Lynn Ito, MD [facility] Pager [phone]  If 7PM-7AM,  please contact night-coverage 04/07/2020, 12:37 PM

## 2020-04-07 NOTE — Progress Notes (Signed)
SURGICAL PROGRESS NOTE   Hospital Day(s): 1.   Post op day(s):  Marland Kitchen   Interval History: Patient seen and examined, no acute events or new complaints overnight. Patient reports feeling much better compared to yesterday.  Patient reports that the back pain has improved.  There is no abdominal pain.  There is no pain radiation.  There is no aggravating factors.  Aggravating factor was Flexeril and Toradol.  The patient main complaint is his back pain that has improved significantly with current medications.  Reports passing gas.  Vital signs in last 24 hours: [min-max] current  Temp:  [97.5 F (36.4 C)-98.3 F (36.8 C)] 98.3 F (36.8 C) (02/19 0735) Pulse Rate:  [58-78] 67 (02/19 0735) Resp:  [14-18] 14 (02/19 0735) BP: (104-127)/(56-82) 108/72 (02/19 0735) SpO2:  [92 %-99 %] 92 % (02/19 0735)     Height: 6\' 1"  (185.4 cm) Weight: (!) 158.8 kg BMI (Calculated): 46.19   Physical Exam:  Constitutional: alert, cooperative and no distress  Respiratory: breathing non-labored at rest  Cardiovascular: regular rate and sinus rhythm  Gastrointestinal: soft, non-tender, and non-distended  Labs:  CBC Latest Ref Rng & Units 04/07/2020 04/06/2020 10/05/2019  WBC 4.0 - 10.5 K/uL 8.6 15.0(H) 9.9  Hemoglobin 13.0 - 17.0 g/dL 12.5(L) 14.2 14.2  Hematocrit 39.0 - 52.0 % 38.1(L) 42.1 42.3  Platelets 150 - 400 K/uL 175 222 205   CMP Latest Ref Rng & Units 04/07/2020 04/06/2020 01/11/2020  Glucose 70 - 99 mg/dL 92 01/13/2020) -  BUN 6 - 20 mg/dL 16 16 -  Creatinine 952(W - 1.24 mg/dL 4.13 2.44 0.10  Sodium 135 - 145 mmol/L 138 135 -  Potassium 3.5 - 5.1 mmol/L 3.8 3.7 -  Chloride 98 - 111 mmol/L 104 103 -  CO2 22 - 32 mmol/L 28 20(L) -  Calcium 8.9 - 10.3 mg/dL 8.3(L) 9.2 -  Total Protein 6.5 - 8.1 g/dL 7.4 2.72) -  Total Bilirubin 0.3 - 1.2 mg/dL 5.3(G) 6.4(Q) -  Alkaline Phos 38 - 126 U/L 47 63 -  AST 15 - 41 U/L 18 22 -  ALT 0 - 44 U/L 16 18 -    Imaging studies: I personally evaluated the abdominal  CT scan.  There is fat stranding around the sigmoid colon with 1 bubble of air outside consistent with a microperforation.  The fat stranding is slowly resolving.  There is no free fluid.  There is no free air.  Assessment/Plan:  60 y.o. male with acute diverticulitis with microperforation, complicated by pertinent comorbidities including morbid obesity, GERD, fibromyalgia and depression.  Patient responding adequately to IV antibiotic therapy.  Patient also responding to pain medication such as Flexeril and Toradol for his back pain.  I will advance his diet to full liquids and this can be advanced as tolerated.  White blood cells down normalized today.  If patient tolerated diet he can be transition to oral antibiotic and discharged when medically stable.  After discharge patient should be followed by Dr. 58 to discuss about the risk and benefit of elective sigmoid colectomy as outpatient.  [name], MD   ]

## 2020-04-08 DIAGNOSIS — K5792 Diverticulitis of intestine, part unspecified, without perforation or abscess without bleeding: Secondary | ICD-10-CM | POA: Diagnosis not present

## 2020-04-08 LAB — URINALYSIS, ROUTINE W REFLEX MICROSCOPIC
Bilirubin Urine: NEGATIVE
Glucose, UA: NEGATIVE mg/dL
Hgb urine dipstick: NEGATIVE
Ketones, ur: NEGATIVE mg/dL
Leukocytes,Ua: NEGATIVE
Nitrite: NEGATIVE
Protein, ur: NEGATIVE mg/dL
Specific Gravity, Urine: 1.009 (ref 1.005–1.030)
pH: 7 (ref 5.0–8.0)

## 2020-04-08 MED ORDER — CIPROFLOXACIN HCL 500 MG PO TABS: 500.0000 mg | ORAL_TABLET | Freq: Two times a day (BID) | ORAL | 0 refills | Status: AC

## 2020-04-08 MED ORDER — CYCLOBENZAPRINE HCL 10 MG PO TABS: 10.0000 mg | ORAL_TABLET | Freq: Three times a day (TID) | ORAL | 0 refills | Status: AC | PRN

## 2020-04-08 MED ORDER — METRONIDAZOLE 500 MG PO TABS: 500.0000 mg | ORAL_TABLET | Freq: Three times a day (TID) | ORAL | 0 refills | Status: AC

## 2020-04-08 MED ORDER — LACTINEX PO CHEW: 1.0000 | CHEWABLE_TABLET | Freq: Three times a day (TID) | ORAL | 0 refills | Status: AC

## 2020-04-08 NOTE — Discharge Summary (Signed)
[name] [name]:[id number] DOB: 10-30-1960 DOA: 04/06/2020  PCP: [name], NP  Admit date: 04/06/2020 Discharge date: 04/08/2020  Admitted From: Home Disposition: Home  Recommendations for Outpatient Follow-up:  1. Follow up with PCP in 1 week 2. Please obtain BMP/CBC in one week 3. GI Dr. [name] in 3 weeks 4. Surgery Dr. [name] in 1 to 2 weeks     Discharge Condition:Stable CODE STATUS: Full Diet recommendation: Heart Healthy Brief/Interim Summary: Per MOQ:HUTML Pop is a 60 y.o. male with medical history significant for morbid obesity, diverticulitis, GERD, fibromyalgia and depression who presents to the ER for evaluation of low back pain and abdominal pain.  Patient describes pain in the left lower quadrant, he rated his pain a 6 x 10 in intensity at its worst  Had a CT scan of the abdomen and pelvis revealed Sigmoid diverticulosis with acute sigmoid diverticulitis with a microperforation not excluded. No intramural abscess formation or large volume pneumoperitoneum/free fluid. Recommend colonoscopy post treatment and post complete resolution of inflammatory changes to exclude an underlying like density.  GI and general surgery were consulted.  Patient was started on IV antibiotics with Zosyn.  Patient was admitted to the hospital.  #Acute diverticulitis with microperforation Patient presented for evaluation of left lower quadrant pain similar to his prior episodes of acute diverticulitis associated with diarrhea Leukocytosis improved Patient was started on IV Zosyn GI was following patient recommended continuing antibiotics for 3 weeks and follow-up with Dr. [name] in 3 weeks after discharge for planned colonoscopy in 4 to 6 weeks electively if feasible depending on clinical course. Surgery was also consulted-recommended advancing diet if tolerated switch to p.o. oral antibiotics and follow-up Dr. [name] in 2weeks to discuss elective sigmoid colectomy as outpatient. GI  and Surgery both recommended on discharge to treat pt with ciprofloxacin and metronidazole x3 weeks This am he has no pain, tolerated diet, no n/v. Stable for discharge .    Morbid obesity(BMI 46) Complicates overall prognosis and care    Hypothyroidism Continue Synthroid    Obstructive sleep apnea Continue on CPAP    GERD Continue Protonix    Discharge Diagnoses:  Principal Problem:   Acute diverticulitis Active Problems:   Hypothyroidism   GERD (gastroesophageal reflux disease)   Depression   Obesity, Class III, BMI 40-49.9 (morbid obesity) (HCC)   OSA (obstructive sleep apnea)    Discharge Instructions  Discharge Instructions    Call MD for:  persistant nausea and vomiting   Complete by: As directed    Call MD for:  severe uncontrolled pain   Complete by: As directed    Call MD for:  temperature >100.4   Complete by: As directed    Diet - low sodium heart healthy   Complete by: As directed    Discharge instructions   Complete by: As directed    F/u with GI Dr. [name]  F/u with Dr. [name] surgery F/u with pcp in one week   Increase activity slowly   Complete by: As directed      Allergies as of 04/08/2020      Reactions   Celecoxib    Zoloft [sertraline Hcl] Anxiety      Medication List    STOP taking these medications   naproxen 500 MG tablet Commonly known as: NAPROSYN     TAKE these medications   busPIRone 7.5 MG tablet Commonly known as: BUSPAR Take 1 tablet (7.5 mg total) by mouth 2 (two) times daily.   ciprofloxacin 500 MG  tablet Commonly known as: CIPRO Take 1 tablet (500 mg total) by mouth 2 (two) times daily for 21 days.   cyclobenzaprine 10 MG tablet Commonly known as: FLEXERIL Take 1 tablet (10 mg total) by mouth 3 (three) times daily as needed for up to 7 days for muscle spasms.   Eucrisa 2 % Oint Generic drug: Crisaborole Apply 1 application topically 2 (two) times daily as needed.   lactobacillus  acidophilus & bulgar chewable tablet Chew 1 tablet by mouth 3 (three) times daily with meals for 21 days.   levothyroxine 75 MCG tablet Commonly known as: SYNTHROID Take 1 tablet (75 mcg total) by mouth daily.   metroNIDAZOLE 500 MG tablet Commonly known as: Flagyl Take 1 tablet (500 mg total) by mouth 3 (three) times daily for 21 days. What changed: when to take this   NON FORMULARY cpap device   omeprazole 20 MG capsule Commonly known as: PRILOSEC TAKE 1 CAPSULE BY MOUTH EVERY DAY   ondansetron 4 MG disintegrating tablet Commonly known as: Zofran ODT Take 1 tablet (4 mg total) by mouth every 8 (eight) hours as needed for nausea or vomiting.       Follow-up Information    [name], [name], MD Follow up in 3 week(s).   Specialty: General Surgery Contact information: [address] [place] [address] [phone]        [name], MD Follow up in 3 week(s).   Specialty: Gastroenterology Contact information: [address] [place] [address] [phone]        [name], NP Follow up in 1 week(s).   Specialty: Nurse Practitioner Contact information: [address] [place] [address] [phone]              Allergies  Allergen Reactions  . Celecoxib   . Zoloft [Sertraline Hcl] Anxiety    Consultations:  GI and surgery   Procedures/Studies: DG Abd 1 View  Result Date: 04/06/2020 CLINICAL DATA:  Pain EXAM: ABDOMEN - 1 VIEW COMPARISON:  January 11, 2020 FINDINGS: Air and stool-filled nondilated loops of bowel. Moderate colonic stool burden diffusely throughout the colon. Visualized lung bases are unremarkable. Degenerative changes of the lumbar spine. IMPRESSION: Nonobstructive bowel gas pattern. Moderate colonic stool burden diffusely throughout the colon. Electronically Signed   By: Meda Klinefelter MD   On: 04/06/2020 16:26   CT ABDOMEN PELVIS W CONTRAST  Result Date: 04/06/2020 CLINICAL DATA:   Diverticulitis diagnosis 3 days ago. Suspected complicated diverticulitis presenting with severe back pain. EXAM: CT ABDOMEN AND PELVIS WITH CONTRAST TECHNIQUE: Multidetector CT imaging of the abdomen and pelvis was performed using the standard protocol following bolus administration of intravenous contrast. CONTRAST:  OMNIPAQUE IOHEXOL 300 MG/ML  SOLN COMPARISON:  Abdomen pelvis 01/11/2020, x-ray abdomen 04/04/2020, MR lumbar spine 11/08/2018 FINDINGS: Lower chest: Subsegmental atelectasis within the left lower lobe. No acute abnormality. Possible tiny hiatal hernia. Hepatobiliary: No focal liver abnormality. 3.5 cm calcified gallstone within the gallbladder lumen. Likely noncalcified gallstone within the lumen of the gallbladder fundus (2:30). No gallbladder wall thickening, or pericholecystic fluid. No biliary dilatation. Pancreas: Diffusely atrophic. No focal lesion. Otherwise normal pancreatic contour. No surrounding inflammatory changes. No main pancreatic ductal dilatation. Spleen: Normal in size without focal abnormality. A splenule is noted. Adrenals/Urinary Tract: No adrenal nodule bilaterally. No nephroureterolithiasis. Bilateral kidneys enhance symmetrically. No hydronephrosis. No hydroureter. The urinary bladder is unremarkable. Stomach/Bowel: PO contrast reaches the transverse colon. Stomach is within normal limits. No evidence  of small bowel wall thickening or dilatation. Scattered sigmoid colon diverticulosis. Short segment bowel wall thickening of the distal sigmoid colon. Associated pericolonic fat stranding appendix appears normal. Vascular/Lymphatic: No abdominal aorta or iliac aneurysm. The aorta is tortuous. No atherosclerotic plaque of the aorta and its branches. No abdominal, pelvic, or inguinal lymphadenopathy. Reproductive: Prostate is unremarkable. Other: No intraperitoneal free fluid. Cannot exclude a couple of tiny foci of intraperitoneal gas along the sigmoid colon (5:63). No  organized fluid collection. Musculoskeletal: No abdominal wall hernia or abnormality. No suspicious lytic or blastic osseous lesions. No acute displaced fracture. Multilevel degenerative changes of the spine. IMPRESSION: Sigmoid diverticulosis with acute sigmoid diverticulitis with a microperforation not excluded. No intramural abscess formation or large volume pneumoperitoneum/free fluid. Recommend colonoscopy post treatment and post complete resolution of inflammatory changes to exclude an underlying like density. Electronically Signed   By: Tish Frederickson M.D.   On: 04/06/2020 05:16       Subjective: Much better.  No pain.  Tolerating feeding.  No nausea vomiting or diarrhea  Discharge Exam: Vitals:   04/08/20 0432 04/08/20 0752  BP: 130/81 130/75  Pulse: 62 63  Resp: 18 18  Temp: 97.9 F (36.6 C) (!) 97.5 F (36.4 C)  SpO2: 95% 97%   Vitals:   04/07/20 2021 04/08/20 0007 04/08/20 0432 04/08/20 0752  BP: 115/76 130/82 130/81 130/75  Pulse: 70 67 62 63  Resp: 18 18 18 18   Temp: 97.8 F (36.6 C) 98 F (36.7 C) 97.9 F (36.6 C) (!) 97.5 F (36.4 C)  TempSrc: Oral Oral Axillary Oral  SpO2: 96% 97% 95% 97%  Weight:      Height:        General: Pt is alert, awake, not in acute distress Cardiovascular: RRR, S1/S2 +, no rubs, no gallops Respiratory: CTA bilaterally, no wheezing, no rhonchi Abdominal: Soft, NT, ND, bowel sounds + Extremities: no edema, no cyanosis    The results of significant diagnostics from this hospitalization (including imaging, microbiology, ancillary and laboratory) are listed below for reference.     Microbiology: Recent Results (from the past 240 hour(s))  Resp Panel by RT-PCR (Flu A&B, Covid) Nasopharyngeal Swab     Status: None   Collection Time: 04/06/20  5:55 AM   Specimen: Nasopharyngeal Swab; Nasopharyngeal(NP) swabs in vial transport medium  Result Value Ref Range Status   SARS Coronavirus 2 by RT PCR NEGATIVE NEGATIVE Final     Comment: (NOTE) SARS-CoV-2 target nucleic acids are NOT DETECTED.  The SARS-CoV-2 RNA is generally detectable in upper respiratory specimens during the acute phase of infection. The lowest concentration of SARS-CoV-2 viral copies this assay can detect is 138 copies/mL. A negative result does not preclude SARS-Cov-2 infection and should not be used as the sole basis for treatment or other patient management decisions. A negative result may occur with  improper specimen collection/handling, submission of specimen other than nasopharyngeal swab, presence of viral mutation(s) within the areas targeted by this assay, and inadequate number of viral copies(<138 copies/mL). A negative result must be combined with clinical observations, patient history, and epidemiological information. The expected result is Negative.  Fact Sheet for Patients:  04/08/20  Fact Sheet for Healthcare Providers:  BloggerCourse.com  This test is no t yet approved or cleared by the [place] FDA and  has been authorized for detection and/or diagnosis of SARS-CoV-2 by FDA under an Emergency Use Authorization (EUA). This EUA will remain  in effect (meaning this test can be used)  for the duration of the COVID-19 declaration under Section 564(b)(1) of the Act, 21 U.S.C.section 360bbb-3(b)(1), unless the authorization is terminated  or revoked sooner.       Influenza A by PCR NEGATIVE NEGATIVE Final   Influenza B by PCR NEGATIVE NEGATIVE Final    Comment: (NOTE) The Xpert Xpress SARS-CoV-2/FLU/RSV plus assay is intended as an aid in the diagnosis of influenza from Nasopharyngeal swab specimens and should not be used as a sole basis for treatment. Nasal washings and aspirates are unacceptable for Xpert Xpress SARS-CoV-2/FLU/RSV testing.  Fact Sheet for Patients: BloggerCourse.com  Fact Sheet for Healthcare  Providers: SeriousBroker.it  This test is not yet approved or cleared by the [place] FDA and has been authorized for detection and/or diagnosis of SARS-CoV-2 by FDA under an Emergency Use Authorization (EUA). This EUA will remain in effect (meaning this test can be used) for the duration of the COVID-19 declaration under Section 564(b)(1) of the Act, 21 U.S.C. section 360bbb-3(b)(1), unless the authorization is terminated or revoked.  Performed at [facility], [address] [place], [place] [address]      Labs: BNP (last 3 results) No results for input(s): BNP in the last 8760 hours. Basic Metabolic Panel: Recent Labs  Lab 04/06/20 0210 04/07/20 0331  NA 135 138  K 3.7 3.8  CL 103 104  CO2 20* 28  GLUCOSE 113* 92  BUN 16 16  CREATININE 1.05 0.90  CALCIUM 9.2 8.3*   Liver Function Tests: Recent Labs  Lab 04/06/20 0210 04/07/20 0331  AST 22 18  ALT 18 16  ALKPHOS 63 47  BILITOT 1.6* 1.5*  PROT 8.8* 7.4  ALBUMIN 4.2 3.3*   Recent Labs  Lab 04/06/20 0210  LIPASE 30   No results for input(s): AMMONIA in the last 168 hours. CBC: Recent Labs  Lab 04/06/20 0210 04/07/20 0331  WBC 15.0* 8.6  NEUTROABS 11.3*  --   HGB 14.2 12.5*  HCT 42.1 38.1*  MCV 86.4 89.2  PLT 222 175   Cardiac Enzymes: No results for input(s): CKTOTAL, CKMB, CKMBINDEX, TROPONINI in the last 168 hours. BNP: Invalid input(s): POCBNP CBG: No results for input(s): GLUCAP in the last 168 hours. D-Dimer No results for input(s): DDIMER in the last 72 hours. Hgb A1c No results for input(s): HGBA1C in the last 72 hours. Lipid Profile No results for input(s): CHOL, HDL, LDLCALC, TRIG, CHOLHDL, LDLDIRECT in the last 72 hours. Thyroid function studies No results for input(s): TSH, T4TOTAL, T3FREE, THYROIDAB in the last 72 hours.  Invalid input(s): FREET3 Anemia work up No results for input(s): VITAMINB12, FOLATE, FERRITIN, TIBC, IRON,  RETICCTPCT in the last 72 hours. Urinalysis    Component Value Date/Time   COLORURINE YELLOW (A) 04/06/2020 0555   APPEARANCEUR CLEAR (A) 04/06/2020 0555   APPEARANCEUR Clear 01/19/2020 0320   LABSPEC 1.009 04/06/2020 0555   PHURINE 7.0 04/06/2020 0555   GLUCOSEU NEGATIVE 04/06/2020 0555   HGBUR NEGATIVE 04/06/2020 0555   BILIRUBINUR NEGATIVE 04/06/2020 0555   BILIRUBINUR Negative 01/19/2020 0320   KETONESUR NEGATIVE 04/06/2020 0555   PROTEINUR NEGATIVE 04/06/2020 0555   UROBILINOGEN 0.2 01/03/2020 1412   NITRITE NEGATIVE 04/06/2020 0555   LEUKOCYTESUR NEGATIVE 04/06/2020 0555   Sepsis Labs Invalid input(s): PROCALCITONIN,  WBC,  LACTICIDVEN Microbiology Recent Results (from the past 240 hour(s))  Resp Panel by RT-PCR (Flu A&B, Covid) Nasopharyngeal Swab     Status: None   Collection Time: 04/06/20  5:55 AM   Specimen: Nasopharyngeal Swab; Nasopharyngeal(NP)  swabs in vial transport medium  Result Value Ref Range Status   SARS Coronavirus 2 by RT PCR NEGATIVE NEGATIVE Final    Comment: (NOTE) SARS-CoV-2 target nucleic acids are NOT DETECTED.  The SARS-CoV-2 RNA is generally detectable in upper respiratory specimens during the acute phase of infection. The lowest concentration of SARS-CoV-2 viral copies this assay can detect is 138 copies/mL. A negative result does not preclude SARS-Cov-2 infection and should not be used as the sole basis for treatment or other patient management decisions. A negative result may occur with  improper specimen collection/handling, submission of specimen other than nasopharyngeal swab, presence of viral mutation(s) within the areas targeted by this assay, and inadequate number of viral copies(<138 copies/mL). A negative result must be combined with clinical observations, patient history, and epidemiological information. The expected result is Negative.  Fact Sheet for Patients:  BloggerCourse.comhttps://www.fda.gov/media/152166/download  Fact Sheet for  Healthcare Providers:  SeriousBroker.ithttps://www.fda.gov/media/152162/download  This test is no t yet approved or cleared by the [place] FDA and  has been authorized for detection and/or diagnosis of SARS-CoV-2 by FDA under an Emergency Use Authorization (EUA). This EUA will remain  in effect (meaning this test can be used) for the duration of the COVID-19 declaration under Section 564(b)(1) of the Act, 21 U.S.C.section 360bbb-3(b)(1), unless the authorization is terminated  or revoked sooner.       Influenza A by PCR NEGATIVE NEGATIVE Final   Influenza B by PCR NEGATIVE NEGATIVE Final    Comment: (NOTE) The Xpert Xpress SARS-CoV-2/FLU/RSV plus assay is intended as an aid in the diagnosis of influenza from Nasopharyngeal swab specimens and should not be used as a sole basis for treatment. Nasal washings and aspirates are unacceptable for Xpert Xpress SARS-CoV-2/FLU/RSV testing.  Fact Sheet for Patients: BloggerCourse.comhttps://www.fda.gov/media/152166/download  Fact Sheet for Healthcare Providers: SeriousBroker.ithttps://www.fda.gov/media/152162/download  This test is not yet approved or cleared by the [place] FDA and has been authorized for detection and/or diagnosis of SARS-CoV-2 by FDA under an Emergency Use Authorization (EUA). This EUA will remain in effect (meaning this test can be used) for the duration of the COVID-19 declaration under Section 564(b)(1) of the Act, 21 U.S.C. section 360bbb-3(b)(1), unless the authorization is terminated or revoked.  Performed at [facility], [address] [place], [place] [id number]      Time coordinating discharge: Over 30 minutes  SIGNED:   Lynn ItoSahar Skyy Mcknight, MD  [facility] 04/08/2020, 10:31 AM Pager   If 7PM-7AM, please contact night-coverage [email or web address] Password [email or web address]

## 2020-04-08 NOTE — Progress Notes (Signed)
Assumed care of patient at 1100. Tyja Gortney S, RN  

## 2020-04-08 NOTE — Progress Notes (Signed)
SURGICAL PROGRESS NOTE   Hospital Day(s): 2.   Post op day(s):  Marland Kitchen   Interval History: Patient seen and examined, no acute events or new complaints overnight. Patient reports feeling well.  He denies abdominal pain.  He report that the back pain has significantly improved.  Patient evaluation.  Aggravating factor was antibiotic therapy and pain medications.  There has been aggravating factors.  Patient report he tolerated soft diet this morning.  Vital signs in last 24 hours: [min-max] current  Temp:  [97.5 F (36.4 C)-98.1 F (36.7 C)] 97.5 F (36.4 C) (02/20 0752) Pulse Rate:  [62-70] 63 (02/20 0752) Resp:  [16-18] 18 (02/20 0752) BP: (112-130)/(73-82) 130/75 (02/20 0752) SpO2:  [95 %-100 %] 97 % (02/20 0752)     Height: 6\' 1"  (185.4 cm) Weight: (!) 158.8 kg BMI (Calculated): 46.19   Physical Exam:  Constitutional: alert, cooperative and no distress  Respiratory: breathing non-labored at rest  Cardiovascular: regular rate and sinus rhythm  Gastrointestinal: soft, non-tender, and non-distended  Labs:  CBC Latest Ref Rng & Units 04/07/2020 04/06/2020 10/05/2019  WBC 4.0 - 10.5 K/uL 8.6 15.0(H) 9.9  Hemoglobin 13.0 - 17.0 g/dL 12.5(L) 14.2 14.2  Hematocrit 39.0 - 52.0 % 38.1(L) 42.1 42.3  Platelets 150 - 400 K/uL 175 222 205   CMP Latest Ref Rng & Units 04/07/2020 04/06/2020 01/11/2020  Glucose 70 - 99 mg/dL 92 01/13/2020) -  BUN 6 - 20 mg/dL 16 16 -  Creatinine 341(P - 1.24 mg/dL 3.79 0.24 0.97  Sodium 135 - 145 mmol/L 138 135 -  Potassium 3.5 - 5.1 mmol/L 3.8 3.7 -  Chloride 98 - 111 mmol/L 104 103 -  CO2 22 - 32 mmol/L 28 20(L) -  Calcium 8.9 - 10.3 mg/dL 8.3(L) 9.2 -  Total Protein 6.5 - 8.1 g/dL 7.4 3.53) -  Total Bilirubin 0.3 - 1.2 mg/dL 2.9(J) 2.4(Q) -  Alkaline Phos 38 - 126 U/L 47 63 -  AST 15 - 41 U/L 18 22 -  ALT 0 - 44 U/L 16 18 -    Imaging studies: No new pertinent imaging studies   Assessment/Plan:  60 y.o. male with acute diverticulitis with  microperforation, complicated by pertinent comorbidities including morbid obesity, GERD, fibromyalgia and depression.  Patient responded well to IV antibiotic therapy.  Patient with abdominal pain.  Patient tolerating diet.  I agree to transition to oral antibiotic therapy.  Patient can be followed by Dr. 58 as outpatient in a week or 2.  No surgical management indicated at this moment.  [name], MD

## 2020-04-08 NOTE — Progress Notes (Signed)
Discharge instructions given and went over with patient at bedside. All questions answered. Awaiting transportation - to be provided by neighbor - to home. Bo Mcclintock, RN

## 2020-04-08 NOTE — Progress Notes (Signed)
Patient discharged home. Jaklyn Alen S, RN  

## 2020-04-12 ENCOUNTER — Other Ambulatory Visit: Payer: Self-pay | Admitting: *Deleted

## 2020-04-12 NOTE — Patient Outreach (Addendum)
[facility]) Care Management  [facility] Care Manager  04/12/2020   Tajae Rybicki [birth year] [id number]  Subjective:  Telephone assessment: RED FLAG on EMMI DISCHARGE CALL: No follow up appt scheduled.  Pt was admitted 2/18-12/20 for diverticulitis with microperforation.  Pt reports his wife will be taking care of making his appts tomorrow. She has been out of town and is back now. None of his appts are overdue.   Objective:  N/A  Encounter Medications:  Outpatient Encounter Medications as of 04/12/2020  Medication Sig  . busPIRone (BUSPAR) 7.5 MG tablet Take 1 tablet (7.5 mg total) by mouth 2 (two) times daily. (Patient not taking: Reported on 04/06/2020)  . ciprofloxacin (CIPRO) 500 MG tablet Take 1 tablet (500 mg total) by mouth 2 (two) times daily for 21 days.  Lennox Solders (EUCRISA) 2 % OINT Apply 1 application topically 2 (two) times daily as needed.  . cyclobenzaprine (FLEXERIL) 10 MG tablet Take 1 tablet (10 mg total) by mouth 3 (three) times daily as needed for up to 7 days for muscle spasms.  Marland Kitchen lactobacillus acidophilus & bulgar (LACTINEX) chewable tablet Chew 1 tablet by mouth 3 (three) times daily with meals for 21 days.  Marland Kitchen levothyroxine (SYNTHROID) 75 MCG tablet Take 1 tablet (75 mcg total) by mouth daily.  . metroNIDAZOLE (FLAGYL) 500 MG tablet Take 1 tablet (500 mg total) by mouth 3 (three) times daily for 21 days.  . NON FORMULARY cpap device  . omeprazole (PRILOSEC) 20 MG capsule TAKE 1 CAPSULE BY MOUTH EVERY DAY  . ondansetron (ZOFRAN ODT) 4 MG disintegrating tablet Take 1 tablet (4 mg total) by mouth every 8 (eight) hours as needed for nausea or vomiting.   No facility-administered encounter medications on file as of 04/12/2020.   Assessment:  Pt states his wife will have everything organized and no need for care management services.  Plan: Will send successful outreach letter for future reference.  Zara Council. Burgess Estelle, MSN, [facility] Gerontological Nurse  Practitioner [facility] Care Management [phone]

## 2020-04-23 ENCOUNTER — Ambulatory Visit (INDEPENDENT_AMBULATORY_CARE_PROVIDER_SITE_OTHER): Payer: Medicare HMO | Admitting: Physician Assistant

## 2020-04-23 ENCOUNTER — Telehealth: Payer: Self-pay

## 2020-04-23 ENCOUNTER — Encounter: Payer: Self-pay | Admitting: Physician Assistant

## 2020-04-23 DIAGNOSIS — M5441 Lumbago with sciatica, right side: Secondary | ICD-10-CM | POA: Diagnosis not present

## 2020-04-23 DIAGNOSIS — M5442 Lumbago with sciatica, left side: Secondary | ICD-10-CM

## 2020-04-23 DIAGNOSIS — K5792 Diverticulitis of intestine, part unspecified, without perforation or abscess without bleeding: Secondary | ICD-10-CM

## 2020-04-23 DIAGNOSIS — G8929 Other chronic pain: Secondary | ICD-10-CM | POA: Diagnosis not present

## 2020-04-23 MED ORDER — OXYCODONE HCL 5 MG PO CAPS: ORAL_CAPSULE | ORAL | 0 refills | Status: DC

## 2020-04-23 MED ORDER — TRAMADOL HCL 50 MG PO TABS: 50.0000 mg | ORAL_TABLET | Freq: Three times a day (TID) | ORAL | 0 refills | Status: AC | PRN

## 2020-04-23 NOTE — Telephone Encounter (Signed)
Patient advised to bring a copy of [employer] insurance card at next visit. [name] [name]

## 2020-04-23 NOTE — Progress Notes (Signed)
[facility] [address] [place], [place] [address]  Internal MEDICINE  Office Visit Note  Patient Name: [name] [name]  [id number]  [id number]  Date of Service: 04/24/2020     Chief Complaint  Patient presents with  . Hospitalization Follow-up  . Diverticulitis     HPI Pt is here for recent hospital follow up. -He was admitted for acute diverticulitis with a microperforation. They kept him for 2 days, 04/06/20-04/08/20. He called the office prior to this and was sent for abdominal XR and given antibiotics prior to hospitalization. He was so uncomfortable with LLQ pain and back pain that this prompted him to go to the ED for evaluation. He was able to start taking Flagyl, but per pharmacy cipro was on back order so he is about to start on it now. He was started on IV Zosyn while admitted. He states he was told to complete both PO antibiotics (Cipro and Flagyl) for 3 week course. Per note, he is supposed to have a f/u with GI and a colonoscopy once inflammation has resolved. Per discharge summary pt will make appts for Dr. [name] (GS) around 04/29/20 and Dr. [name] (GI) around 04/29/20.  Ct abdomen pelvis 04/06/20 IMPRESSION: Sigmoid diverticulosis with acute sigmoid diverticulitis with a microperforation not excluded. No intramural abscess formation or large volume pneumoperitoneum/free fluid. Recommend colonoscopy post treatment and post complete resolution of inflammatory changes to exclude an underlying like density.  -Today he complains primarily of back pain.  He denies any abdominal pain, other than an occasional lower abdominal dull ache. All his pain has been in his back throughout the past few weeks and has been severe. He is supposed to be seen by ortho based on MRI of lumbar spine and will obtain a CD of MRI to bring to their office.  MRI lumbar spine 11/07/20 IMPRESSION: 1. Bulky disc degeneration at L1-L2 with a moderate sized posterior disc extrusion results in  moderate spinal stenosis at the level of the conus medullaris. Mild conus mass effect, but no lower thoracic cord or conus signal abnormality.  2. Mild spinal stenosis also at L2-L3 and L4-L5, with moderate to severe left foraminal stenosis at the latter. Query left L4 radiculitis. There is also moderate left L3 and L5 foraminal stenosis.  3. Small rightward disc extrusion at T12-L1 resulting in up to mild spinal stenosis at that level.   Current Medication: Outpatient Encounter Medications as of 04/23/2020  Medication Sig  . busPIRone (BUSPAR) 7.5 MG tablet Take 1 tablet (7.5 mg total) by mouth 2 (two) times daily.  . ciprofloxacin (CIPRO) 500 MG tablet Take 1 tablet (500 mg total) by mouth 2 (two) times daily for 21 days.  Lennox Solders (EUCRISA) 2 % OINT Apply 1 application topically 2 (two) times daily as needed.  . lactobacillus acidophilus & bulgar (LACTINEX) chewable tablet Chew 1 tablet by mouth 3 (three) times daily with meals for 21 days.  Marland Kitchen levothyroxine (SYNTHROID) 75 MCG tablet Take 1 tablet (75 mcg total) by mouth daily.  . metroNIDAZOLE (FLAGYL) 500 MG tablet Take 1 tablet (500 mg total) by mouth 3 (three) times daily for 21 days.  . NON FORMULARY cpap device  . omeprazole (PRILOSEC) 20 MG capsule TAKE 1 CAPSULE BY MOUTH EVERY DAY  . ondansetron (ZOFRAN ODT) 4 MG disintegrating tablet Take 1 tablet (4 mg total) by mouth every 8 (eight) hours as needed for nausea or vomiting.  Marland Kitchen oxycodone (OXY-IR) 5 MG capsule Only as needed for severe pain  .  traMADol (ULTRAM) 50 MG tablet Take 1 tablet (50 mg total) by mouth every 8 (eight) hours as needed.   No facility-administered encounter medications on file as of 04/23/2020.    Surgical History: Past Surgical History:  Procedure Laterality Date  . LIVER BIOPSY      Medical History: Past Medical History:  Diagnosis Date  . Anxiety   . Arthritis   . Depression   . Diverticulitis   . Fibromyalgia   . GERD  (gastroesophageal reflux disease)   . Glaucoma   . Hepatitis C   . Juvenile epilepsy (HCC)   . Positive TB test   . Sleep apnea     Family History: Family History  Problem Relation Age of Onset  . Aneurysm Mother   . Cancer Father   . Mental illness Sister   . Drug abuse Paternal Aunt   . Drug abuse Maternal Uncle   . Mental illness Sister     Social History   Socioeconomic History  . Marital status: Married    Spouse name: Not on file  . Number of children: 1  . Years of education: Not on file  . Highest education level: Not on file  Occupational History  . Not on file  Tobacco Use  . Smoking status: Never Smoker  . Smokeless tobacco: Never Used  Vaping Use  . Vaping Use: Never used  Substance and Sexual Activity  . Alcohol use: Not Currently  . Drug use: Not Currently  . Sexual activity: Not on file  Other Topics Concern  . Not on file  Social History Narrative  . Not on file   Social Determinants of Health   Financial Resource Strain: Not on file  Food Insecurity: Not on file  Transportation Needs: Not on file  Physical Activity: Not on file  Stress: Not on file  Social Connections: Not on file  Intimate Partner Violence: Not on file      Review of Systems  Constitutional: Negative for chills, fatigue and unexpected weight change.  HENT: Negative for congestion, postnasal drip, rhinorrhea, sneezing and sore throat.   Eyes: Negative for redness.  Respiratory: Negative for cough, chest tightness and shortness of breath.   Cardiovascular: Negative for chest pain and palpitations.  Gastrointestinal: Positive for abdominal pain. Negative for constipation, diarrhea, nausea and vomiting.       Occasional lower abdominal pain  Genitourinary: Negative for dysuria and frequency.  Musculoskeletal: Positive for arthralgias and back pain. Negative for joint swelling and neck pain.  Skin: Negative for rash.  Neurological: Negative.  Negative for tremors and  numbness.  Hematological: Negative for adenopathy. Does not bruise/bleed easily.  Psychiatric/Behavioral: Negative for behavioral problems (Depression), sleep disturbance and suicidal ideas. The patient is nervous/anxious.     Vital Signs: BP 128/86   Pulse 83   Temp 98.1 F (36.7 C)   Resp 16   Ht 6\' 1"  (1.854 m)   Wt (!) 340 lb 9.6 oz (154.5 kg)   SpO2 94%   BMI 44.94 kg/m    Physical Exam Constitutional:      General: He is not in acute distress.    Appearance: He is well-developed. He is obese. He is not diaphoretic.  HENT:     Head: Normocephalic and atraumatic.     Mouth/Throat:     Pharynx: No oropharyngeal exudate.  Eyes:     Pupils: Pupils are equal, round, and reactive to light.  Neck:     Thyroid: No thyromegaly.  Vascular: No JVD.     Trachea: No tracheal deviation.  Cardiovascular:     Rate and Rhythm: Normal rate and regular rhythm.     Heart sounds: Normal heart sounds. No murmur heard. No friction rub. No gallop.   Pulmonary:     Effort: Pulmonary effort is normal. No respiratory distress.     Breath sounds: No wheezing or rales.  Chest:     Chest wall: No tenderness.  Abdominal:     General: Bowel sounds are normal. There is no distension.     Palpations: Abdomen is soft.     Tenderness: There is no abdominal tenderness.  Musculoskeletal:        General: Tenderness present.     Cervical back: Normal range of motion and neck supple.     Comments: Severe low back pain that restricts movement  Lymphadenopathy:     Cervical: No cervical adenopathy.  Skin:    General: Skin is warm and dry.  Neurological:     Mental Status: He is alert and oriented to person, place, and time.     Cranial Nerves: No cranial nerve deficit.  Psychiatric:        Behavior: Behavior normal.        Thought Content: Thought content normal.        Judgment: Judgment normal.       Assessment/Plan: 1. Acute diverticulitis Improved since leaving hospital. Will  finish Flagyl and Cipro prescriptions. Needs to schedule f/u with general surgery/GI as dictated in discharge summary.  2. Chronic bilateral low back pain with bilateral sciatica Pt will f/u with ortho once he obtains CD of MRI results. In the meantime he may take tramadol up to 3x daily as needed for moderate pain. He may take one oxycodone tab only as needed for severe pain. - traMADol (ULTRAM) 50 MG tablet; Take 1 tablet (50 mg total) by mouth every 8 (eight) hours as needed.  Dispense: 90 tablet; Refill: 0 - oxycodone (OXY-IR) 5 MG capsule; Only as needed for severe pain  Dispense: 10 capsule; Refill: 0  3. Obesity, Class III, BMI 40-49.9 (morbid obesity) (HCC) Obesity Counseling: Had a lengthy discussion regarding patients BMI and weight issues. Patient was instructed on portion control as well as increased activity. Also discussed caloric restrictions with trying to maintain intake less than 2000 Kcal. Discussions were made in accordance with the 5As of weight management. Simple actions such as not eating late and if able to, taking a walk is suggested.    General Counseling: Lidia CollumAllen verbalizes understanding of the findings of todays visit and agrees with plan of treatment. I have discussed any further diagnostic evaluation that may be needed or ordered today. We also reviewed his medications today. he has been encouraged to call the office with any questions or concerns that should arise related to todays visit.    Counseling: Reviewed risks and possible side effects associated with taking opiates, benzodiazepines and other CNS depressants. Combination of these could cause dizziness and drowsiness. Advised patient not to drive or operate machinery when taking these medications, as patient's and other's life can be at risk and will have consequences. Patient verbalized understanding in this matter. Dependence and abuse for these drugs will be monitored closely. A Controlled substance policy and  procedure is on file which allows [facility] to order a urine drug screen test at any visit. Patient understands and agrees with the plan   No orders of the defined types were placed  in this encounter.   This patient was seen by Lynn Ito, PA-C in collaboration with Dr. Beverely Risen as a part of collaborative care agreement.   I have reviewed all medical records from hospital follow up including radiology reports and consults from other physicians. Appropriate follow up diagnostics will be scheduled as needed. Patient/ Family understands the plan of treatment. Time spent40 minutes.   Dr Lyndon Code, MD Internal Medicine

## 2020-05-05 DIAGNOSIS — G4733 Obstructive sleep apnea (adult) (pediatric): Secondary | ICD-10-CM | POA: Diagnosis not present

## 2020-05-07 ENCOUNTER — Telehealth: Payer: Self-pay

## 2020-05-07 ENCOUNTER — Other Ambulatory Visit: Payer: Self-pay | Admitting: Physician Assistant

## 2020-05-07 ENCOUNTER — Other Ambulatory Visit: Payer: Self-pay

## 2020-05-07 DIAGNOSIS — G8929 Other chronic pain: Secondary | ICD-10-CM

## 2020-05-07 DIAGNOSIS — M5441 Lumbago with sciatica, right side: Secondary | ICD-10-CM

## 2020-05-07 NOTE — Telephone Encounter (Signed)
Error

## 2020-05-07 NOTE — Telephone Encounter (Signed)
Called in phar with direction

## 2020-05-07 NOTE — Telephone Encounter (Signed)
Spoke with pharmacist and gave oxycodone directions

## 2020-05-08 DIAGNOSIS — H5203 Hypermetropia, bilateral: Secondary | ICD-10-CM | POA: Diagnosis not present

## 2020-05-08 DIAGNOSIS — H2513 Age-related nuclear cataract, bilateral: Secondary | ICD-10-CM | POA: Diagnosis not present

## 2020-05-08 DIAGNOSIS — Z0100 Encounter for examination of eyes and vision without abnormal findings: Secondary | ICD-10-CM | POA: Diagnosis not present

## 2020-05-25 ENCOUNTER — Telehealth: Payer: Self-pay

## 2020-05-25 NOTE — Telephone Encounter (Signed)
Confirmation of verbal order for use of medical home equipment signed by provider and placed in [facility] folder ready for pickup. Toni Amend

## 2020-05-28 ENCOUNTER — Ambulatory Visit: Payer: Self-pay | Admitting: Surgery

## 2020-05-28 ENCOUNTER — Other Ambulatory Visit: Payer: Self-pay

## 2020-05-28 ENCOUNTER — Ambulatory Visit (INDEPENDENT_AMBULATORY_CARE_PROVIDER_SITE_OTHER): Payer: Medicare HMO | Admitting: Surgery

## 2020-05-28 ENCOUNTER — Encounter: Payer: Self-pay | Admitting: Surgery

## 2020-05-28 VITALS — BP 121/81 | HR 68 | Temp 98.3°F | Ht 73.0 in | Wt 342.0 lb

## 2020-05-28 DIAGNOSIS — K5792 Diverticulitis of intestine, part unspecified, without perforation or abscess without bleeding: Secondary | ICD-10-CM | POA: Diagnosis not present

## 2020-05-28 NOTE — Patient Instructions (Addendum)
1 Follow up with [name], MD (Gastroenterology) in 3 weeks   We have sent a referral for you to Dr Horace Porteous office. They should call you to schedule this. Please call them if they have not contacted you by Friday.   We will have you follow up here after you have your colonoscopy with Dr [name].   Call us for a follow up appointment once your colonoscopy has been scheduled.   Continue a high fiber diet with plenty of fluids.

## 2020-05-28 NOTE — Progress Notes (Signed)
Outpatient Surgical Follow Up  05/28/2020  [name] [name] is an 60 y.o. male.   Chief Complaint  Patient presents with  . New Patient (Initial Visit)    Diverticulitis   HPI: 60 year old male well-known to me with at least 3 episodes of diverticulitis the last one requiring hospitalization.  Last episode was a contained perforation.  No need for percutaneous drainage.  He completed antibiotic and did well.  No fevers no chills.  He does have a BMI of 45.  He does have a history of chronic pain.  He does have an appointment next week with the pain clinic. He denies any abdominal pain currently.  No fevers no chills.  He has never had a colonoscopy before.  Does have a history of hepatitis C.  No prior abdominal operations. Is able to perform more than 4 METS of activity without any shortness of breath or chest pain.  He does use a cane for balance and due to his back condition. He did have a CT scan of the abdomen pelvis that I have personally reviewed showing evidence of diverticulitis with a contained perforation.  He does have an enlarged liver and hiatal hernia as well.  Past Medical History:  Diagnosis Date  . Anxiety   . Arthritis   . Depression   . Diverticulitis   . Fibromyalgia   . GERD (gastroesophageal reflux disease)   . Glaucoma   . Hepatitis C   . Juvenile epilepsy (HCC)   . Positive TB test   . Sleep apnea    Uses CPap machine    Past Surgical History:  Procedure Laterality Date  . LIVER BIOPSY      Family History  Problem Relation Age of Onset  . Aneurysm Mother   . Cancer Father   . Mental illness Sister   . Drug abuse Paternal Aunt   . Drug abuse Maternal Uncle   . Mental illness Sister     Social History:  reports that he has never smoked. He has never used smokeless tobacco. He reports previous alcohol use. He reports previous drug use.  Allergies:  Allergies  Allergen Reactions  . Celecoxib   . Zoloft [Sertraline Hcl] Anxiety    Medications  reviewed.    ROS Full ROS performed and is otherwise negative other than what is stated in HPI   BP 121/81   Pulse 68   Temp 98.3 F (36.8 C)   Ht 6\' 1"  (1.854 m)   Wt (!) 342 lb (155.1 kg)   SpO2 95%   BMI 45.12 kg/m   Physical Exam Vitals and nursing note reviewed. Exam conducted with a chaperone present.  Constitutional:      General: He is not in acute distress.    Appearance: Normal appearance. He is obese. He is not ill-appearing.  Eyes:     General: No scleral icterus.       Right eye: No discharge.        Left eye: No discharge.     Conjunctiva/sclera: Conjunctivae normal.  Cardiovascular:     Rate and Rhythm: Normal rate and regular rhythm.  Pulmonary:     Effort: Pulmonary effort is normal. No respiratory distress.     Breath sounds: Normal breath sounds. No stridor. No wheezing or rhonchi.  Abdominal:     General: Abdomen is flat. There is no distension.     Palpations: There is no mass.     Tenderness: There is no abdominal tenderness. There is no  guarding or rebound.     Hernia: No hernia is present.  Musculoskeletal:        General: No swelling or tenderness. Normal range of motion.     Cervical back: Normal range of motion and neck supple. No rigidity or tenderness.  Lymphadenopathy:     Cervical: No cervical adenopathy.  Skin:    General: Skin is warm and dry.     Capillary Refill: Capillary refill takes less than 2 seconds.     Coloration: Skin is not jaundiced.  Neurological:     General: No focal deficit present.     Mental Status: He is alert and oriented to person, place, and time.     Cranial Nerves: No cranial nerve deficit.     Sensory: No sensory deficit.     Motor: No weakness.  Psychiatric:        Mood and Affect: Mood normal.        Behavior: Behavior normal.        Thought Content: Thought content normal.        Judgment: Judgment normal.    Assessment/Plan: 60 year old male with episode of recurrent diverticulitis and most  recently with contained perforation requiring hospitalization.  He has recovered well from the last attack.  There is no need for emergent surgical intervention or further antibiotics at this time.  He will need outpatient colonoscopy to assess his colon intraluminally.  Given his recurrence of attacks and his complicated episode of diverticulitis requiring hospital admission I would recommend elective colectomy at some point time.  Do think that he will benefit from a robotic approach. Extensive counseling provided.   Greater than 50% of the 45 minutes  visit was spent in counseling/coordination of care   Sterling Big, MD [facility] General Surgeon

## 2020-05-31 ENCOUNTER — Encounter: Payer: Self-pay | Admitting: Surgery

## 2020-06-01 DIAGNOSIS — M545 Low back pain, unspecified: Secondary | ICD-10-CM | POA: Diagnosis not present

## 2020-06-05 DIAGNOSIS — G4733 Obstructive sleep apnea (adult) (pediatric): Secondary | ICD-10-CM | POA: Diagnosis not present

## 2020-06-22 DIAGNOSIS — G4733 Obstructive sleep apnea (adult) (pediatric): Secondary | ICD-10-CM | POA: Diagnosis not present

## 2020-06-25 ENCOUNTER — Encounter: Payer: Self-pay | Admitting: Physician Assistant

## 2020-06-25 ENCOUNTER — Ambulatory Visit (INDEPENDENT_AMBULATORY_CARE_PROVIDER_SITE_OTHER): Payer: Medicaid Other | Admitting: Physician Assistant

## 2020-06-25 ENCOUNTER — Other Ambulatory Visit: Payer: Self-pay

## 2020-06-25 DIAGNOSIS — Z9989 Dependence on other enabling machines and devices: Secondary | ICD-10-CM | POA: Diagnosis not present

## 2020-06-25 DIAGNOSIS — G4733 Obstructive sleep apnea (adult) (pediatric): Secondary | ICD-10-CM

## 2020-06-25 DIAGNOSIS — M5441 Lumbago with sciatica, right side: Secondary | ICD-10-CM

## 2020-06-25 DIAGNOSIS — G8929 Other chronic pain: Secondary | ICD-10-CM | POA: Diagnosis not present

## 2020-06-25 DIAGNOSIS — K5792 Diverticulitis of intestine, part unspecified, without perforation or abscess without bleeding: Secondary | ICD-10-CM

## 2020-06-25 DIAGNOSIS — Z6841 Body Mass Index (BMI) 40.0 and over, adult: Secondary | ICD-10-CM

## 2020-06-25 DIAGNOSIS — M5442 Lumbago with sciatica, left side: Secondary | ICD-10-CM

## 2020-06-25 DIAGNOSIS — R69 Illness, unspecified: Secondary | ICD-10-CM | POA: Diagnosis not present

## 2020-06-25 DIAGNOSIS — F339 Major depressive disorder, recurrent, unspecified: Secondary | ICD-10-CM

## 2020-06-25 MED ORDER — BUSPIRONE HCL 7.5 MG PO TABS: 7.5000 mg | ORAL_TABLET | Freq: Two times a day (BID) | ORAL | 0 refills | Status: DC

## 2020-06-25 NOTE — Progress Notes (Signed)
[facility] [address] [place], [place] [address]  Internal MEDICINE  Office Visit Note  Patient Name: [name] [name]  [id number]  [id number]  Date of Service: 06/27/2020  Chief Complaint  Patient presents with  . Follow-up    2 month fu  . Depression  . Anxiety  . Arthritis  . Gastroesophageal Reflux  . Glaucoma  . Sleep Apnea    HPI Pt is here for follow up. -Pt is in process of scheduling for colonoscopy for surgical planning for possible colon resection secondary to diverticulitis.  -Saw ortho for back pain, they are reviewing MRI and there is plan to give shot for deadening nerve for 2 years. Not scheduled yet, they gave 30 days of tramadol in the meantime. Patient is going to call to follow up on scheduling. He is aware that they will be taking over his pain management as they are now addressing the source of his pain. Once pain under control, plans to really work on weight loss goals. -Supposed to be established with a therapist and his insurance has given recommendations. He would like a refill of his cymbalta but does plan to call and establish with counselor. He does have depression secondary to his back pain and inability to do certain activities with his son and hopes that procedure will help with this as well.  Current Medication: Outpatient Encounter Medications as of 06/25/2020  Medication Sig  . Crisaborole (EUCRISA) 2 % OINT Apply 1 application topically 2 (two) times daily as needed.  Marland Kitchen levothyroxine (SYNTHROID) 75 MCG tablet Take 1 tablet (75 mcg total) by mouth daily.  . NON FORMULARY cpap device  . omeprazole (PRILOSEC) 20 MG capsule TAKE 1 CAPSULE BY MOUTH EVERY DAY  . oxycodone (OXY-IR) 5 MG capsule Only as needed for severe pain (Patient taking differently: 5 mg daily as needed. Once a day as needed for severe pain)  . [DISCONTINUED] busPIRone (BUSPAR) 7.5 MG tablet Take 1 tablet (7.5 mg total) by mouth 2 (two) times daily.  . busPIRone (BUSPAR)  7.5 MG tablet Take 1 tablet (7.5 mg total) by mouth 2 (two) times daily.   No facility-administered encounter medications on file as of 06/25/2020.    Surgical History: Past Surgical History:  Procedure Laterality Date  . LIVER BIOPSY      Medical History: Past Medical History:  Diagnosis Date  . Anxiety   . Arthritis   . Depression   . Diverticulitis   . Fibromyalgia   . GERD (gastroesophageal reflux disease)   . Glaucoma   . Hepatitis C   . Juvenile epilepsy (HCC)   . Positive TB test   . Sleep apnea    Uses CPap machine    Family History: Family History  Problem Relation Age of Onset  . Aneurysm Mother   . Cancer Father   . Mental illness Sister   . Drug abuse Paternal Aunt   . Drug abuse Maternal Uncle   . Mental illness Sister     Social History   Socioeconomic History  . Marital status: Married    Spouse name: Not on file  . Number of children: 1  . Years of education: Not on file  . Highest education level: Not on file  Occupational History  . Not on file  Tobacco Use  . Smoking status: Never Smoker  . Smokeless tobacco: Never Used  Vaping Use  . Vaping Use: Never used  Substance and Sexual Activity  . Alcohol use: Not Currently  .  Drug use: Not Currently  . Sexual activity: Not on file  Other Topics Concern  . Not on file  Social History Narrative   ** Merged History Encounter **       Social Determinants of Health   Financial Resource Strain: Not on file  Food Insecurity: Not on file  Transportation Needs: Not on file  Physical Activity: Not on file  Stress: Not on file  Social Connections: Not on file  Intimate Partner Violence: Not on file      Review of Systems  Constitutional: Positive for fatigue. Negative for chills and unexpected weight change.  HENT: Negative for congestion, postnasal drip, rhinorrhea, sneezing and sore throat.   Eyes: Negative for redness.  Respiratory: Negative for cough, chest tightness and shortness  of breath.   Cardiovascular: Negative for chest pain and palpitations.  Gastrointestinal: Negative for constipation, diarrhea, nausea and vomiting.  Genitourinary: Negative for dysuria and frequency.  Musculoskeletal: Positive for arthralgias and back pain. Negative for joint swelling and neck pain.  Skin: Negative for rash.  Neurological: Negative.  Negative for tremors and numbness.  Hematological: Negative for adenopathy. Does not bruise/bleed easily.  Psychiatric/Behavioral: Negative for behavioral problems (Depression), sleep disturbance and suicidal ideas. The patient is not nervous/anxious.     Vital Signs: BP 132/82   Pulse 69   Temp 98 F (36.7 C)   Resp 16   Ht 6\' 1"  (1.854 m)   Wt (!) 348 lb 3.2 oz (157.9 kg)   SpO2 96%   BMI 45.94 kg/m    Physical Exam Vitals and nursing note reviewed.  Constitutional:      General: He is not in acute distress.    Appearance: He is well-developed. He is obese. He is not diaphoretic.  HENT:     Head: Normocephalic and atraumatic.     Mouth/Throat:     Pharynx: No oropharyngeal exudate.  Eyes:     Pupils: Pupils are equal, round, and reactive to light.  Neck:     Thyroid: No thyromegaly.     Vascular: No JVD.     Trachea: No tracheal deviation.  Cardiovascular:     Rate and Rhythm: Normal rate and regular rhythm.     Heart sounds: Normal heart sounds. No murmur heard. No friction rub. No gallop.   Pulmonary:     Effort: Pulmonary effort is normal. No respiratory distress.     Breath sounds: No wheezing or rales.  Chest:     Chest wall: No tenderness.  Abdominal:     General: Bowel sounds are normal.     Palpations: Abdomen is soft.  Musculoskeletal:        General: Normal range of motion.     Cervical back: Normal range of motion and neck supple.     Comments: Severe back pain worse with movement  Lymphadenopathy:     Cervical: No cervical adenopathy.  Skin:    General: Skin is warm and dry.  Neurological:      Mental Status: He is alert and oriented to person, place, and time.     Cranial Nerves: No cranial nerve deficit.  Psychiatric:        Behavior: Behavior normal.        Thought Content: Thought content normal.        Judgment: Judgment normal.        Assessment/Plan: 1. Chronic bilateral low back pain with bilateral sciatica Followed by ortho with plans for interventional procedure, pain managed by ortho  now  2. Depression, recurrent (HCC) Worse lately with pain limiting activities, needs a refill of buspar today and will call to establish with counselor - busPIRone (BUSPAR) 7.5 MG tablet; Take 1 tablet (7.5 mg total) by mouth 2 (two) times daily.  Dispense: 180 tablet; Refill: 0  3. OSA on CPAP Continue CPAP  4. Acute diverticulitis Follow by GI with plans for colonoscopy and possible surgical resection  5. Morbid obesity with BMI of 45.0-49.9, adult [facility]) Discussed the importance of weight management through healthy eating and daily exercise as tolerated. Discussed the negative effects obesity has on pulmonary health, cardiac health as well as overall general health and well being. -Will consider trulicity in future once other conditions addressed. Had lost weight on his own but recently gained some back. Will make conscious effort to improve diet.   General Counseling: Lidia Collum understanding of the findings of todays visit and agrees with plan of treatment. I have discussed any further diagnostic evaluation that may be needed or ordered today. We also reviewed his medications today. he has been encouraged to call the office with any questions or concerns that should arise related to todays visit.    No orders of the defined types were placed in this encounter.   Meds ordered this encounter  Medications  . busPIRone (BUSPAR) 7.5 MG tablet    Sig: Take 1 tablet (7.5 mg total) by mouth 2 (two) times daily.    Dispense:  180 tablet    Refill:  0    This patient  was seen by Lynn Ito, PA-C in collaboration with Dr. Beverely Risen as a part of collaborative care agreement.   Total time spent:30 Minutes Time spent includes review of chart, medications, test results, and follow up plan with the patient.      Dr Lyndon Code Internal medicine

## 2020-07-04 ENCOUNTER — Other Ambulatory Visit: Payer: Self-pay | Admitting: Physician Assistant

## 2020-07-04 DIAGNOSIS — F339 Major depressive disorder, recurrent, unspecified: Secondary | ICD-10-CM

## 2020-07-05 ENCOUNTER — Other Ambulatory Visit: Payer: Self-pay

## 2020-07-05 DIAGNOSIS — K219 Gastro-esophageal reflux disease without esophagitis: Secondary | ICD-10-CM

## 2020-07-05 DIAGNOSIS — G4733 Obstructive sleep apnea (adult) (pediatric): Secondary | ICD-10-CM | POA: Diagnosis not present

## 2020-07-05 MED ORDER — OMEPRAZOLE 20 MG PO CPDR: DELAYED_RELEASE_CAPSULE | ORAL | 3 refills | Status: DC

## 2020-07-25 ENCOUNTER — Ambulatory Visit: Payer: Medicaid Other

## 2020-07-27 ENCOUNTER — Other Ambulatory Visit: Payer: Self-pay

## 2020-07-27 ENCOUNTER — Encounter: Payer: Self-pay | Admitting: Nurse Practitioner

## 2020-07-27 ENCOUNTER — Ambulatory Visit (INDEPENDENT_AMBULATORY_CARE_PROVIDER_SITE_OTHER): Payer: Medicaid Other | Admitting: Nurse Practitioner

## 2020-07-27 VITALS — BP 131/77 | HR 72 | Temp 98.3°F | Resp 16 | Ht 73.0 in | Wt 349.6 lb

## 2020-07-27 DIAGNOSIS — W57XXXA Bitten or stung by nonvenomous insect and other nonvenomous arthropods, initial encounter: Secondary | ICD-10-CM

## 2020-07-27 DIAGNOSIS — M5441 Lumbago with sciatica, right side: Secondary | ICD-10-CM

## 2020-07-27 DIAGNOSIS — A6920 Lyme disease, unspecified: Secondary | ICD-10-CM | POA: Diagnosis not present

## 2020-07-27 DIAGNOSIS — M5442 Lumbago with sciatica, left side: Secondary | ICD-10-CM | POA: Diagnosis not present

## 2020-07-27 DIAGNOSIS — G8929 Other chronic pain: Secondary | ICD-10-CM | POA: Diagnosis not present

## 2020-07-27 MED ORDER — DOXYCYCLINE HYCLATE 100 MG PO CAPS: 100.0000 mg | ORAL_CAPSULE | Freq: Two times a day (BID) | ORAL | 0 refills | Status: AC

## 2020-07-27 MED ORDER — MELOXICAM 15 MG PO TABS: 15.0000 mg | ORAL_TABLET | Freq: Every day | ORAL | 0 refills | Status: DC

## 2020-07-27 NOTE — Progress Notes (Signed)
[facility] [place], [place] [address]  Internal MEDICINE  Office Visit Note  Patient Name: [name] [name]  [id number]  [id number]  Date of Service: 08/04/2020  Chief Complaint  Patient presents with   Acute Visit    Stiffness in neck, headache, tick bite was removed Wednesday      HPI [name] [name] presents for an acute sick visit after a tick bite was found and removed on Wednesday.  He is reporting stiffness in his neck that started on Monday and the tick was found 2 days later with a large reddened area around the site.  He reports fatigue, joint pains, muscle weakness, enlarged lymph nodes in the area of the tick bite, headache, and malaise.  He reports no energy at home.  His wife is present during his office visit today and she reports she had some leftover doxycycline and gave him 1 100-mg-capsule twice daily for the past 2 days.  Both the patient and his wife report that they did their research and are pretty sure that he has Lyme disease.   Current Medication:  Outpatient Encounter Medications as of 07/27/2020  Medication Sig   busPIRone (BUSPAR) 7.5 MG tablet TAKE 1 TABLET BY MOUTH 2 TIMES DAILY.   Crisaborole (EUCRISA) 2 % OINT Apply 1 application topically 2 (two) times daily as needed.   doxycycline (VIBRAMYCIN) 100 MG capsule Take 1 capsule (100 mg total) by mouth 2 (two) times daily for 10 days.   levothyroxine (SYNTHROID) 75 MCG tablet Take 1 tablet (75 mcg total) by mouth daily.   meloxicam (MOBIC) 15 MG tablet Take 1 tablet (15 mg total) by mouth daily.   NON FORMULARY cpap device   omeprazole (PRILOSEC) 20 MG capsule TAKE 1 CAPSULE BY MOUTH EVERY DAY   oxycodone (OXY-IR) 5 MG capsule Only as needed for severe pain (Patient taking differently: 5 mg daily as needed. Once a day as needed for severe pain)   No facility-administered encounter medications on file as of 07/27/2020.      Medical History: Past Medical History:  Diagnosis Date   Anxiety     Arthritis    Depression    Diverticulitis    Fibromyalgia    GERD (gastroesophageal reflux disease)    Glaucoma    Hepatitis C    Juvenile epilepsy ([place])    Positive TB test    Sleep apnea    Uses CPap machine     Vital Signs: BP 131/77   Pulse 72   Temp 98.3 F (36.8 C)   Resp 16   Ht _0  (1.854 m)   Wt (!) 349 lb 9.6 oz (158.6 kg)   SpO2 94%   BMI 46.12 kg/m    Review of Systems  Constitutional:  Positive for activity change, appetite change, chills, fatigue and fever.  HENT: Negative.    Eyes: Negative.   Respiratory: Negative.  Negative for cough and shortness of breath.   Cardiovascular: Negative.  Negative for chest pain.  Genitourinary: Negative.   Musculoskeletal:  Positive for arthralgias, myalgias, neck pain and neck stiffness.  Neurological:  Positive for headaches.  Psychiatric/Behavioral: Negative.     Physical Exam Vitals reviewed.  Constitutional:      General: He is not in acute distress.    Appearance: He is obese. He is ill-appearing.  HENT:     Head: Normocephalic and atraumatic.     Nose: Nose normal.     Mouth/Throat:     Mouth: Mucous membranes  are moist.     Pharynx: Oropharynx is clear.  Eyes:     Extraocular Movements: Extraocular movements intact.     Conjunctiva/sclera: Conjunctivae normal.     Pupils: Pupils are equal, round, and reactive to light.  Cardiovascular:     Rate and Rhythm: Normal rate and regular rhythm.     Pulses: Normal pulses.     Heart sounds: Normal heart sounds.  Pulmonary:     Effort: Pulmonary effort is normal. No respiratory distress.     Breath sounds: Normal breath sounds.  Abdominal:     General: Bowel sounds are normal.     Palpations: Abdomen is soft.  Musculoskeletal:     Cervical back: Rigidity present.  Lymphadenopathy:     Lower Body: Right inguinal adenopathy present.  Skin:    General: Skin is warm and dry.     Capillary Refill: Capillary refill takes less than 2 seconds.      Findings: Erythema and rash (erythema migrans surrounding tick bite site with small scabbed ulceration.) present. Rash is macular.  Neurological:     Mental Status: He is alert and oriented to person, place, and time.  Psychiatric:        Mood and Affect: Mood normal.        Behavior: Behavior normal.      Assessment/Plan: 1. Lyme disease with erythema migrans lesion 5 cm or greater in diameter [name] [name] presents with a tick bite with erythema migrans and other associated symptoms of lyme disease. Labs ordered to monitor liver, and kidney function as well as electrolyte levels. Testing ordered for lyme disease. Empirically treat with doxycycline 100 mg twice a day for 10 days. Prescription sent to pharmacy.  - CMP14+EGFR - CBC with Differential/Platelet - doxycycline (VIBRAMYCIN) 100 MG capsule; Take 1 capsule (100 mg total) by mouth 2 (two) times daily for 10 days.  Dispense: 20 capsule; Refill: 0 - Lyme Disease Serology w/Reflex - Lyme Disease, Western Blot  2. Tick bite of pelvic region, initial encounter Tick bite site in right inguinal region with erythema migrans and regional lymphadenopathy identified. Lyme disease testing ordered. - Lyme Disease Serology w/Reflex - Lyme Disease, Western Blot  3. Chronic bilateral low back pain with bilateral sciatica Patient is have a lot of joint pains possibly due to lyme disease but also has chronic low back pain. Current medication was not working, meloxicam prescription ordered.  - meloxicam (MOBIC) 15 MG tablet; Take 1 tablet (15 mg total) by mouth daily.  Dispense: 30 tablet; Refill: 0   General Counseling: Haley verbalizes understanding of the findings of todays visit and agrees with plan of treatment. I have discussed any further diagnostic evaluation that may be needed or ordered today. We also reviewed his medications today. he has been encouraged to call the office with any questions or concerns that should arise related to todays  visit.    Counseling:    Orders Placed This Encounter  Procedures   CMP14+EGFR   CBC with Differential/Platelet   Lyme Disease Serology w/Reflex   Lyme Disease, Western Blot    Meds ordered this encounter  Medications   doxycycline (VIBRAMYCIN) 100 MG capsule    Sig: Take 1 capsule (100 mg total) by mouth 2 (two) times daily for 10 days.    Dispense:  20 capsule    Refill:  0   meloxicam (MOBIC) 15 MG tablet    Sig: Take 1 tablet (15 mg total) by mouth daily.    Dispense:  30 tablet    Refill:  0   Return in about 4 weeks (around 08/24/2020) for F/U lyme disease , Balthazar Dooly PCP.   [place] Controlled Substance Database was reviewed by me.  Time spent:30 Minutes  This patient was seen by Jonetta Osgood, FNP-C in collaboration with Dr. Clayborn Bigness as a part of collaborative care agreement.  Hiro Vipond R. Valetta Fuller, MSN, FNP-C Internal Medicine

## 2020-07-31 DIAGNOSIS — W57XXXD Bitten or stung by nonvenomous insect and other nonvenomous arthropods, subsequent encounter: Secondary | ICD-10-CM | POA: Diagnosis not present

## 2020-07-31 DIAGNOSIS — R1084 Generalized abdominal pain: Secondary | ICD-10-CM | POA: Diagnosis not present

## 2020-07-31 DIAGNOSIS — K5792 Diverticulitis of intestine, part unspecified, without perforation or abscess without bleeding: Secondary | ICD-10-CM | POA: Diagnosis not present

## 2020-07-31 DIAGNOSIS — R109 Unspecified abdominal pain: Secondary | ICD-10-CM | POA: Diagnosis not present

## 2020-07-31 DIAGNOSIS — K5900 Constipation, unspecified: Secondary | ICD-10-CM | POA: Diagnosis not present

## 2020-08-01 ENCOUNTER — Encounter: Payer: Self-pay | Admitting: Nurse Practitioner

## 2020-08-02 DIAGNOSIS — R69 Illness, unspecified: Secondary | ICD-10-CM | POA: Diagnosis not present

## 2020-08-02 DIAGNOSIS — M1990 Unspecified osteoarthritis, unspecified site: Secondary | ICD-10-CM | POA: Diagnosis not present

## 2020-08-02 DIAGNOSIS — K5900 Constipation, unspecified: Secondary | ICD-10-CM | POA: Diagnosis not present

## 2020-08-02 DIAGNOSIS — F419 Anxiety disorder, unspecified: Secondary | ICD-10-CM | POA: Diagnosis not present

## 2020-08-02 DIAGNOSIS — G4733 Obstructive sleep apnea (adult) (pediatric): Secondary | ICD-10-CM | POA: Diagnosis not present

## 2020-08-02 DIAGNOSIS — K5792 Diverticulitis of intestine, part unspecified, without perforation or abscess without bleeding: Secondary | ICD-10-CM | POA: Diagnosis not present

## 2020-08-02 DIAGNOSIS — Z6841 Body Mass Index (BMI) 40.0 and over, adult: Secondary | ICD-10-CM | POA: Diagnosis not present

## 2020-08-02 DIAGNOSIS — G8929 Other chronic pain: Secondary | ICD-10-CM | POA: Diagnosis not present

## 2020-08-02 DIAGNOSIS — E039 Hypothyroidism, unspecified: Secondary | ICD-10-CM | POA: Diagnosis not present

## 2020-08-04 DIAGNOSIS — G8929 Other chronic pain: Secondary | ICD-10-CM | POA: Insufficient documentation

## 2020-08-05 DIAGNOSIS — G4733 Obstructive sleep apnea (adult) (pediatric): Secondary | ICD-10-CM | POA: Diagnosis not present

## 2020-08-08 ENCOUNTER — Ambulatory Visit: Payer: Medicare HMO

## 2020-08-15 LAB — CMP14+EGFR
ALT: 24 IU/L (ref 0–44)
AST: 17 IU/L (ref 0–40)
Albumin/Globulin Ratio: 1.1 — ABNORMAL LOW (ref 1.2–2.2)
Albumin: 4.3 g/dL (ref 3.8–4.9)
Alkaline Phosphatase: 87 IU/L (ref 44–121)
BUN/Creatinine Ratio: 16 (ref 9–20)
BUN: 15 mg/dL (ref 6–24)
Bilirubin Total: 0.5 mg/dL (ref 0.0–1.2)
CO2: 22 mmol/L (ref 20–29)
Calcium: 9.5 mg/dL (ref 8.7–10.2)
Chloride: 104 mmol/L (ref 96–106)
Creatinine, Ser: 0.93 mg/dL (ref 0.76–1.27)
Globulin, Total: 3.9 g/dL (ref 1.5–4.5)
Glucose: 85 mg/dL (ref 65–99)
Potassium: 4.2 mmol/L (ref 3.5–5.2)
Sodium: 141 mmol/L (ref 134–144)
Total Protein: 8.2 g/dL (ref 6.0–8.5)
eGFR: 96 mL/min/{1.73_m2} (ref >59)

## 2020-08-15 LAB — CBC WITH DIFFERENTIAL/PLATELET
Basophils Absolute: 0.1 10*3/uL (ref 0.0–0.2)
Basos: 1 %
EOS (ABSOLUTE): 0.3 10*3/uL (ref 0.0–0.4)
Eos: 3 %
Hematocrit: 43.8 % (ref 37.5–51.0)
Hemoglobin: 14.6 g/dL (ref 13.0–17.7)
Immature Grans (Abs): 0.1 10*3/uL (ref 0.0–0.1)
Immature Granulocytes: 1 %
Lymphocytes Absolute: 2.1 10*3/uL (ref 0.7–3.1)
Lymphs: 24 %
MCH: 29.3 pg (ref 26.6–33.0)
MCHC: 33.3 g/dL (ref 31.5–35.7)
MCV: 88 fL (ref 79–97)
Monocytes Absolute: 0.9 10*3/uL (ref 0.1–0.9)
Monocytes: 11 %
Neutrophils Absolute: 5.4 10*3/uL (ref 1.4–7.0)
Neutrophils: 60 %
Platelets: 220 10*3/uL (ref 150–450)
RBC: 4.98 x10E6/uL (ref 4.14–5.80)
RDW: 12.5 % (ref 11.6–15.4)
WBC: 8.8 10*3/uL (ref 3.4–10.8)

## 2020-08-15 LAB — LYME DISEASE, WESTERN BLOT
IgG P18 Ab.: ABSENT
IgG P23 Ab.: ABSENT
IgG P28 Ab.: ABSENT
IgG P30 Ab.: ABSENT
IgG P39 Ab.: ABSENT
IgG P45 Ab.: ABSENT
IgG P58 Ab.: ABSENT
IgG P66 Ab.: ABSENT
IgG P93 Ab.: ABSENT
IgM P39 Ab.: ABSENT
IgM P41 Ab.: ABSENT
Lyme IgG Wb: NEGATIVE
Lyme IgM Wb: NEGATIVE

## 2020-08-15 LAB — LYME DISEASE SEROLOGY W/REFLEX: Lyme Total Antibody EIA: NEGATIVE

## 2020-08-16 ENCOUNTER — Telehealth: Payer: Self-pay

## 2020-08-16 ENCOUNTER — Other Ambulatory Visit: Payer: Self-pay | Admitting: Nurse Practitioner

## 2020-08-16 DIAGNOSIS — J069 Acute upper respiratory infection, unspecified: Secondary | ICD-10-CM

## 2020-08-16 MED ORDER — PREDNISONE 10 MG PO TABS: ORAL_TABLET | ORAL | 0 refills | Status: DC

## 2020-08-16 MED ORDER — AZITHROMYCIN 250 MG PO TABS: ORAL_TABLET | ORAL | 0 refills | Status: DC

## 2020-08-16 NOTE — Telephone Encounter (Signed)
Pt advised  that we send med to [facility] and drink plenty of water and if symptoms worse go to ED

## 2020-08-22 ENCOUNTER — Ambulatory Visit: Payer: Medicare HMO

## 2020-08-24 ENCOUNTER — Telehealth (INDEPENDENT_AMBULATORY_CARE_PROVIDER_SITE_OTHER): Payer: Medicaid Other | Admitting: Nurse Practitioner

## 2020-08-24 ENCOUNTER — Encounter: Payer: Self-pay | Admitting: Nurse Practitioner

## 2020-08-24 VITALS — Resp 16 | Ht 73.0 in | Wt 343.0 lb

## 2020-08-24 DIAGNOSIS — M25551 Pain in right hip: Secondary | ICD-10-CM | POA: Diagnosis not present

## 2020-08-24 DIAGNOSIS — M25552 Pain in left hip: Secondary | ICD-10-CM | POA: Diagnosis not present

## 2020-08-24 DIAGNOSIS — S30860S Insect bite (nonvenomous) of lower back and pelvis, sequela: Secondary | ICD-10-CM

## 2020-08-24 DIAGNOSIS — W57XXXS Bitten or stung by nonvenomous insect and other nonvenomous arthropods, sequela: Secondary | ICD-10-CM

## 2020-08-24 MED ORDER — OXYCODONE HCL 5 MG PO TABS: 5.0000 mg | ORAL_TABLET | ORAL | 0 refills | Status: DC | PRN

## 2020-08-24 MED ORDER — CYCLOBENZAPRINE HCL 10 MG PO TABS
10.0000 mg | ORAL_TABLET | Freq: Three times a day (TID) | ORAL | 0 refills | Status: DC | PRN
Start: 2020-08-24 — End: 2021-03-04

## 2020-08-24 NOTE — Progress Notes (Signed)
[facility] [address] [place], [place] [address]  Internal MEDICINE  Telephone Visit  Patient Name: [name] [name]  [id number]  [id number]  Date of Service: 08/24/2020  I connected with the patient at 1:10 PM by telephone and verified the patients identity using two identifiers.   I discussed the limitations, risks, security and privacy concerns of performing an evaluation and management service by telephone and the availability of in person appointments. I also discussed with the patient that there may be a patient responsible charge related to the service.  The patient expressed understanding and agrees to proceed.    Chief Complaint  Patient presents with   Lyme Disease    4 week fup   Telephone Assessment    Phone visit [phone]   Telephone Screen    HPI [name] [name] presents via virtual video visit for a 4 week follow up after being bit by a tick and having symptoms associated with Lyme disease. He was tested for Lyme disease and the serology and antibody testing came back negative.  Recovering from covid now Recently Was swimming in pool and had shooting pain in the groin and radiating to the hips.     Current Medication: Outpatient Encounter Medications as of 08/24/2020  Medication Sig   busPIRone (BUSPAR) 7.5 MG tablet TAKE 1 TABLET BY MOUTH 2 TIMES DAILY.   Crisaborole (EUCRISA) 2 % OINT Apply 1 application topically 2 (two) times daily as needed.   cyclobenzaprine (FLEXERIL) 10 MG tablet Take 1 tablet (10 mg total) by mouth 3 (three) times daily as needed for muscle spasms.   levothyroxine (SYNTHROID) 75 MCG tablet Take 1 tablet (75 mcg total) by mouth daily.   NON FORMULARY cpap device   omeprazole (PRILOSEC) 20 MG capsule TAKE 1 CAPSULE BY MOUTH EVERY DAY   oxyCODONE (OXY IR/ROXICODONE) 5 MG immediate release tablet Take 1 tablet (5 mg total) by mouth every 4 (four) hours as needed for severe pain or breakthrough pain.   [DISCONTINUED] azithromycin (ZITHROMAX)  250 MG tablet Take one tab a day for 10 days for uri (Patient not taking: Reported on 08/24/2020)   [DISCONTINUED] meloxicam (MOBIC) 15 MG tablet Take 1 tablet (15 mg total) by mouth daily. (Patient not taking: Reported on 08/24/2020)   [DISCONTINUED] oxycodone (OXY-IR) 5 MG capsule Only as needed for severe pain (Patient not taking: Reported on 08/24/2020)   [DISCONTINUED] predniSONE (DELTASONE) 10 MG tablet Take one tab 3 x day for 3 days, then take one tab 2 x a day for 3 days and then take one tab a day for 3 days for copd (Patient not taking: Reported on 08/24/2020)   No facility-administered encounter medications on file as of 08/24/2020.    Surgical History: Past Surgical History:  Procedure Laterality Date   LIVER BIOPSY      Medical History: Past Medical History:  Diagnosis Date   Anxiety    Arthritis    Depression    Diverticulitis    Fibromyalgia    GERD (gastroesophageal reflux disease)    Glaucoma    Hepatitis C    Juvenile epilepsy ([facility])    Positive TB test    Sleep apnea    Uses CPap machine    Family History: Family History  Problem Relation Age of Onset   Aneurysm Mother    Cancer Father    Mental illness Sister    Drug abuse Paternal Aunt    Drug abuse Maternal Uncle    Mental illness Sister  Social History   Socioeconomic History   Marital status: Married    Spouse name: Not on file   Number of children: 1   Years of education: Not on file   Highest education level: Not on file  Occupational History   Not on file  Tobacco Use   Smoking status: Never   Smokeless tobacco: Never  Vaping Use   Vaping Use: Never used  Substance and Sexual Activity   Alcohol use: Not Currently   Drug use: Not Currently   Sexual activity: Not on file  Other Topics Concern   Not on file  Social History Narrative   ** Merged History Encounter **       Social Determinants of Health   Financial Resource Strain: Not on file  Food Insecurity: Not on file   Transportation Needs: Not on file  Physical Activity: Not on file  Stress: Not on file  Social Connections: Not on file  Intimate Partner Violence: Not on file      Review of Systems  Constitutional: Negative.  Negative for chills, fatigue and fever.  HENT: Negative.    Respiratory: Negative.  Negative for cough, chest tightness, shortness of breath and wheezing.   Cardiovascular:  Negative for chest pain and palpitations.  Gastrointestinal: Negative.   Genitourinary: Negative.   Musculoskeletal:  Positive for arthralgias.       Hip and groin pain, bilateral  Psychiatric/Behavioral: Negative.     Vital Signs: Resp 16   Ht 6\' 1"  (1.854 m)   Wt (!) 343 lb (155.6 kg)   BMI 45.25 kg/m    Observation/Objective: Makayla is alert and oriented and engages in conversation appropriately. He does not appear to be in any acute distress at this time.    Assessment/Plan: 1. Acute hip pain, bilateral Xrays of bilateral hips ordered, short term course of prn pain medication and cyclobenzaprine prescribed and sent to his pharmacy.  - cyclobenzaprine (FLEXERIL) 10 MG tablet; Take 1 tablet (10 mg total) by mouth 3 (three) times daily as needed for muscle spasms.  Dispense: 63 tablet; Refill: 0 - oxyCODONE (OXY IR/ROXICODONE) 5 MG immediate release tablet; Take 1 tablet (5 mg total) by mouth every 4 (four) hours as needed for severe pain or breakthrough pain.  Dispense: 30 tablet; Refill: 0 - DG Hip Unilat W OR W/O Pelvis 2-3 Views Left; Future - DG Hip Unilat W OR W/O Pelvis 2-3 Views Right; Future  2. Tick bite of pelvic region, sequela Possible lyme disease symptoms have resolved per patient, test results were negative.   General Counseling: Freida [name] understanding of the findings of today's phone visit and agrees with plan of treatment. I have discussed any further diagnostic evaluation that may be needed or ordered today. We also reviewed his medications today. he has been  encouraged to call the office with any questions or concerns that should arise related to todays visit.    Orders Placed This Encounter  Procedures   DG Hip Unilat W OR W/O Pelvis 2-3 Views Left   DG Hip Unilat W OR W/O Pelvis 2-3 Views Right    Meds ordered this encounter  Medications   cyclobenzaprine (FLEXERIL) 10 MG tablet    Sig: Take 1 tablet (10 mg total) by mouth 3 (three) times daily as needed for muscle spasms.    Dispense:  63 tablet    Refill:  0   oxyCODONE (OXY IR/ROXICODONE) 5 MG immediate release tablet    Sig: Take 1 tablet (5  mg total) by mouth every 4 (four) hours as needed for severe pain or breakthrough pain.    Dispense:  30 tablet    Refill:  0    Time spent:30 Minutes  Return if symptoms worsen or fail to improve.  This patient was seen by Sallyanne Kuster, FNP-C in collaboration with Dr. Beverely Risen as a part of collaborative care agreement.   Kailena Lubas R. Tedd Sias, MSN, FNP-C Internal medicine

## 2020-08-28 ENCOUNTER — Ambulatory Visit
Admission: RE | Admit: 2020-08-28 | Discharge: 2020-08-28 | Disposition: A | Discharge status: Home / Self Care | Payer: Medicare HMO | Attending: Nurse Practitioner | Admitting: Nurse Practitioner

## 2020-08-28 ENCOUNTER — Ambulatory Visit
Admission: RE | Admit: 2020-08-28 | Discharge: 2020-08-28 | Disposition: A | Discharge status: Home / Self Care | Payer: Medicare HMO | Source: Ambulatory Visit | Attending: Nurse Practitioner | Admitting: Nurse Practitioner

## 2020-08-28 ENCOUNTER — Other Ambulatory Visit: Payer: Self-pay

## 2020-08-28 DIAGNOSIS — M25551 Pain in right hip: Secondary | ICD-10-CM | POA: Insufficient documentation

## 2020-08-28 DIAGNOSIS — M25552 Pain in left hip: Secondary | ICD-10-CM | POA: Diagnosis not present

## 2020-09-04 ENCOUNTER — Telehealth: Payer: Self-pay

## 2020-09-04 DIAGNOSIS — G4733 Obstructive sleep apnea (adult) (pediatric): Secondary | ICD-10-CM | POA: Diagnosis not present

## 2020-09-04 NOTE — Telephone Encounter (Signed)
Left vm to screen for 09/05/20 appointment-Toni 

## 2020-09-04 NOTE — Telephone Encounter (Signed)
error 

## 2020-09-05 ENCOUNTER — Ambulatory Visit: Payer: Medicare HMO

## 2020-09-10 ENCOUNTER — Telehealth: Payer: Self-pay

## 2020-09-10 NOTE — Telephone Encounter (Signed)
Pts wife called and stated that patient is having abdominal pain along with bumps on testicles. Chart will be reviewed.

## 2020-09-19 ENCOUNTER — Other Ambulatory Visit: Payer: Self-pay

## 2020-09-19 ENCOUNTER — Ambulatory Visit (INDEPENDENT_AMBULATORY_CARE_PROVIDER_SITE_OTHER): Payer: Medicaid Other

## 2020-09-19 DIAGNOSIS — G4733 Obstructive sleep apnea (adult) (pediatric): Secondary | ICD-10-CM | POA: Diagnosis not present

## 2020-09-19 NOTE — Progress Notes (Signed)
95 percentile pressure 90   95th percentile leak 0   apnea index 1.3 /hr  apnea-hypopnea index  2.4 /hr   total days used  >4 hr 90 days  total days used <4 hr 0 days  Total compliance 100 percent    He is doing great no problems or questions at this time

## 2020-09-20 DIAGNOSIS — G4733 Obstructive sleep apnea (adult) (pediatric): Secondary | ICD-10-CM | POA: Diagnosis not present

## 2020-09-24 ENCOUNTER — Encounter: Payer: Self-pay | Admitting: Nurse Practitioner

## 2020-09-24 ENCOUNTER — Ambulatory Visit (INDEPENDENT_AMBULATORY_CARE_PROVIDER_SITE_OTHER): Payer: Medicaid Other | Admitting: Nurse Practitioner

## 2020-09-24 ENCOUNTER — Other Ambulatory Visit: Payer: Self-pay

## 2020-09-24 VITALS — BP 134/64 | HR 76 | Temp 97.7°F | Resp 16 | Ht 73.0 in | Wt 344.4 lb

## 2020-09-24 DIAGNOSIS — M25552 Pain in left hip: Secondary | ICD-10-CM

## 2020-09-24 DIAGNOSIS — Z6841 Body Mass Index (BMI) 40.0 and over, adult: Secondary | ICD-10-CM

## 2020-09-24 DIAGNOSIS — W57XXXS Bitten or stung by nonvenomous insect and other nonvenomous arthropods, sequela: Secondary | ICD-10-CM | POA: Diagnosis not present

## 2020-09-24 DIAGNOSIS — S30860S Insect bite (nonvenomous) of lower back and pelvis, sequela: Secondary | ICD-10-CM | POA: Diagnosis not present

## 2020-09-24 DIAGNOSIS — M25551 Pain in right hip: Secondary | ICD-10-CM | POA: Diagnosis not present

## 2020-09-24 MED ORDER — OXYCODONE HCL 5 MG PO TABS: 5.0000 mg | ORAL_TABLET | Freq: Every day | ORAL | 0 refills | Status: DC | PRN

## 2020-09-24 NOTE — Progress Notes (Signed)
[facility] [address] [place], [place] [address]  Internal MEDICINE  Office Visit Note  Patient Name: [name] [name]  [id number]  [id number]  Date of Service: 09/24/2020  Chief Complaint  Patient presents with   Follow-up    Lyme disease with erythema lesion 5 cm or greater in diameter follow up, hip pain is there and has not eased up at all.     Gastroesophageal Reflux   Depression   Arthritis    HPI Sheriff presents for a follow up visit for hip and back pain. He is accompanied by his wife to the office visit today. He is also still having a lot of neck stiffness, headaches and fatigue. He was initially treated for possible lyme disease with doxycycline in June. He had labs drawn to test for lyme disease but they were negative. It is recommended to test again at a later date because sometimes the antibodies are not present early on.  -interested in a chronic pain management referral. -the patient and his wife are wondering if it is possible to get him a hospital bed and how to do that.   Current Medication: Outpatient Encounter Medications as of 09/24/2020  Medication Sig   busPIRone (BUSPAR) 7.5 MG tablet TAKE 1 TABLET BY MOUTH 2 TIMES DAILY.   Crisaborole (EUCRISA) 2 % OINT Apply 1 application topically 2 (two) times daily as needed.   cyclobenzaprine (FLEXERIL) 10 MG tablet Take 1 tablet (10 mg total) by mouth 3 (three) times daily as needed for muscle spasms.   levothyroxine (SYNTHROID) 75 MCG tablet Take 1 tablet (75 mcg total) by mouth daily.   NON FORMULARY cpap device   omeprazole (PRILOSEC) 20 MG capsule TAKE 1 CAPSULE BY MOUTH EVERY DAY   [DISCONTINUED] oxyCODONE (OXY IR/ROXICODONE) 5 MG immediate release tablet Take 1 tablet (5 mg total) by mouth every 4 (four) hours as needed for severe pain or breakthrough pain.   oxyCODONE (OXY IR/ROXICODONE) 5 MG immediate release tablet Take 1 tablet (5 mg total) by mouth daily as needed for severe pain or breakthrough  pain.   No facility-administered encounter medications on file as of 09/24/2020.    Surgical History: Past Surgical History:  Procedure Laterality Date   LIVER BIOPSY      Medical History: Past Medical History:  Diagnosis Date   Anxiety    Arthritis    Depression    Diverticulitis    Fibromyalgia    GERD (gastroesophageal reflux disease)    Glaucoma    Hepatitis C    Juvenile epilepsy (HCC)    Positive TB test    Sleep apnea    Uses CPap machine    Family History: Family History  Problem Relation Age of Onset   Aneurysm Mother    Cancer Father    Mental illness Sister    Drug abuse Paternal Aunt    Drug abuse Maternal Uncle    Mental illness Sister     Social History   Socioeconomic History   Marital status: Married    Spouse name: Not on file   Number of children: 1   Years of education: Not on file   Highest education level: Not on file  Occupational History   Not on file  Tobacco Use   Smoking status: Never   Smokeless tobacco: Never  Vaping Use   Vaping Use: Never used  Substance and Sexual Activity   Alcohol use: Not Currently   Drug use: Not Currently   Sexual activity:  Not on file  Other Topics Concern   Not on file  Social History Narrative   ** Merged History Encounter **       Social Determinants of Health   Financial Resource Strain: Not on file  Food Insecurity: Not on file  Transportation Needs: Not on file  Physical Activity: Not on file  Stress: Not on file  Social Connections: Not on file  Intimate Partner Violence: Not on file      Review of Systems  Constitutional:  Negative for chills, fatigue and unexpected weight change.  HENT:  Negative for congestion, rhinorrhea, sneezing and sore throat.   Eyes:  Negative for redness.  Respiratory:  Negative for cough, chest tightness and shortness of breath.   Cardiovascular:  Negative for chest pain and palpitations.  Gastrointestinal:  Negative for abdominal pain,  constipation, diarrhea, nausea and vomiting.  Genitourinary:  Negative for dysuria and frequency.  Musculoskeletal:  Positive for arthralgias, back pain, neck pain and neck stiffness. Negative for joint swelling.       Bilateral hip pain  Skin:  Negative for rash.  Neurological: Negative.  Negative for tremors and numbness.  Hematological:  Negative for adenopathy. Does not bruise/bleed easily.  Psychiatric/Behavioral:  Negative for behavioral problems (Depression), self-injury, sleep disturbance and suicidal ideas. The patient is not nervous/anxious.    Vital Signs: BP 134/64   Pulse 76   Temp 97.7 F (36.5 C)   Resp 16   Ht 6\' 1"  (1.854 m)   Wt (!) 344 lb 6.4 oz (156.2 kg)   SpO2 95%   BMI 45.44 kg/m    Physical Exam Vitals reviewed.  Constitutional:      General: He is not in acute distress.    Appearance: He is well-developed. He is not diaphoretic.  HENT:     Head: Normocephalic and atraumatic.     Mouth/Throat:     Pharynx: No oropharyngeal exudate.  Eyes:     Pupils: Pupils are equal, round, and reactive to light.  Neck:     Thyroid: No thyromegaly.     Vascular: No JVD.     Trachea: No tracheal deviation.  Cardiovascular:     Rate and Rhythm: Normal rate and regular rhythm.     Heart sounds: Normal heart sounds. No murmur heard.   No friction rub. No gallop.  Pulmonary:     Effort: Pulmonary effort is normal. No respiratory distress.     Breath sounds: No wheezing or rales.  Chest:     Chest wall: No tenderness.  Abdominal:     General: Bowel sounds are normal.     Palpations: Abdomen is soft.  Musculoskeletal:        General: Normal range of motion.     Cervical back: Normal range of motion and neck supple.  Lymphadenopathy:     Cervical: No cervical adenopathy.  Skin:    General: Skin is warm and dry.  Neurological:     Mental Status: He is alert and oriented to person, place, and time.     Cranial Nerves: No cranial nerve deficit.  Psychiatric:         Behavior: Behavior normal.        Thought Content: Thought content normal.        Judgment: Judgment normal.     Assessment/Plan: 1. Acute hip pain, bilateral Continuing to have hip pain - oxyCODONE (OXY IR/ROXICODONE) 5 MG immediate release tablet; Take 1 tablet (5 mg total) by mouth daily as  needed for severe pain or breakthrough pain.  Dispense: 30 tablet; Refill: 0 - Ambulatory referral to Pain Clinic  2. Tick bite of pelvic region, sequela The first time checking labs for lyme disease was negative, will order labs to recheck for lyme disease. Patient was initially treated for lyme disease with doxycycline.  - Lyme Disease, Western Blot - Lyme Disease Serology w/Reflex  3. BMI 40.0-44.9, adult [facility]) Patient is aware that he is obese and needs to lose weight. He has chronic low back pain and bilateral knee pain. Losing some weight will improve some of the pain.    General Counseling: Lidia Collum understanding of the findings of todays visit and agrees with plan of treatment. I have discussed any further diagnostic evaluation that may be needed or ordered today. We also reviewed his medications today. he has been encouraged to call the office with any questions or concerns that should arise related to todays visit.    Orders Placed This Encounter  Procedures   Lyme Disease, Western Blot   Lyme Disease Serology w/Reflex   Ambulatory referral to Pain Clinic    Meds ordered this encounter  Medications   oxyCODONE (OXY IR/ROXICODONE) 5 MG immediate release tablet    Sig: Take 1 tablet (5 mg total) by mouth daily as needed for severe pain or breakthrough pain.    Dispense:  30 tablet    Refill:  0    Return in about 1 month (around 10/25/2020) for F/U, Review labs/test, Tyjay Galindo PCP.   Total time spent:30 Minutes Time spent includes review of chart, medications, test results, and follow up plan with the patient.   [place] Controlled Substance Database was reviewed by  me.  This patient was seen by Sallyanne Kuster, FNP-C in collaboration with Dr. Beverely Risen as a part of collaborative care agreement.   Litha Lamartina R. Tedd Sias, MSN, FNP-C Internal medicine

## 2020-10-01 DIAGNOSIS — S30860S Insect bite (nonvenomous) of lower back and pelvis, sequela: Secondary | ICD-10-CM | POA: Diagnosis not present

## 2020-10-01 DIAGNOSIS — A6920 Lyme disease, unspecified: Secondary | ICD-10-CM | POA: Diagnosis not present

## 2020-10-01 DIAGNOSIS — W57XXXS Bitten or stung by nonvenomous insect and other nonvenomous arthropods, sequela: Secondary | ICD-10-CM | POA: Diagnosis not present

## 2020-10-05 ENCOUNTER — Encounter: Payer: Self-pay | Admitting: Student in an Organized Health Care Education/Training Program

## 2020-10-05 LAB — LYME DISEASE, WESTERN BLOT
IgG P18 Ab.: ABSENT
IgG P23 Ab.: ABSENT
IgG P28 Ab.: ABSENT
IgG P30 Ab.: ABSENT
IgG P39 Ab.: ABSENT
IgG P45 Ab.: ABSENT
IgG P58 Ab.: ABSENT
IgG P66 Ab.: ABSENT
IgG P93 Ab.: ABSENT
IgM P39 Ab.: ABSENT
IgM P41 Ab.: ABSENT
Lyme IgG Wb: NEGATIVE
Lyme IgM Wb: NEGATIVE

## 2020-10-05 LAB — LYME DISEASE SEROLOGY W/REFLEX: Lyme Total Antibody EIA: NEGATIVE

## 2020-10-15 ENCOUNTER — Encounter: Payer: Self-pay | Admitting: Nurse Practitioner

## 2020-10-15 ENCOUNTER — Other Ambulatory Visit: Payer: Self-pay | Admitting: Internal Medicine

## 2020-10-15 ENCOUNTER — Other Ambulatory Visit: Payer: Self-pay | Admitting: Nurse Practitioner

## 2020-10-15 ENCOUNTER — Telehealth: Payer: Self-pay

## 2020-10-15 DIAGNOSIS — M25551 Pain in right hip: Secondary | ICD-10-CM

## 2020-10-15 DIAGNOSIS — M25552 Pain in left hip: Secondary | ICD-10-CM

## 2020-10-15 MED ORDER — OXYCODONE HCL 5 MG PO TABS: 5.0000 mg | ORAL_TABLET | Freq: Four times a day (QID) | ORAL | 0 refills | Status: DC | PRN

## 2020-10-15 MED ORDER — OXYCODONE HCL 5 MG PO CAPS: ORAL_CAPSULE | ORAL | 0 refills | Status: DC

## 2020-10-15 NOTE — Telephone Encounter (Signed)
Received call from patient's wife requesting med refill. She stated she called 10/12/20, left vm on nurse line, then called back and spoke with Erie Noe. She was told someone would call her back by end of day. Toni Amend took the call-Toni

## 2020-10-16 ENCOUNTER — Encounter: Payer: Self-pay | Admitting: Nurse Practitioner

## 2020-10-16 ENCOUNTER — Other Ambulatory Visit: Payer: Self-pay | Admitting: Internal Medicine

## 2020-10-16 MED ORDER — OXYCODONE HCL 5 MG PO CAPS: ORAL_CAPSULE | ORAL | 0 refills | Status: DC

## 2020-10-16 NOTE — Telephone Encounter (Signed)
Can u cjeck on his rx for oxycodone

## 2020-10-25 ENCOUNTER — Ambulatory Visit: Payer: Medicare HMO | Admitting: Nurse Practitioner

## 2020-10-25 DIAGNOSIS — M545 Low back pain, unspecified: Secondary | ICD-10-CM | POA: Diagnosis not present

## 2020-11-01 ENCOUNTER — Telehealth: Payer: Self-pay | Admitting: Nurse Practitioner

## 2020-11-01 NOTE — Chronic Care Management (AMB) (Signed)
  Chronic Care Management   Note  11/01/2020 Name: [name] [name] MRN: [id number] DOB: Feb 01, 1961  [name] [name] is a 60 y.o. year old male who is a primary care patient of [name] Kuster, [name] [name]. I reached out to [name] [name] by phone today in response to a referral sent by [name] [name] PCP, [name] Kuster, [name] [name].   [name] [name] was given information about Chronic Care Management services today including:  CCM service includes personalized support from designated clinical staff supervised by his physician, including individualized plan of care and coordination with other care providers 24/7 contact phone numbers for assistance for urgent and routine care needs. Service will only be billed when office clinical staff spend 20 minutes or more in a month to coordinate care. Only one practitioner may furnish and bill the service in a calendar month. The patient may stop CCM services at any time (effective at the end of the month) by phone call to the office staff.   [name] [name] verbally agreed to assistance and services provided by embedded care coordination/care management team today.  Follow up plan:  [name] [name], [name] [name]

## 2020-11-07 ENCOUNTER — Ambulatory Visit (INDEPENDENT_AMBULATORY_CARE_PROVIDER_SITE_OTHER): Payer: Medicaid Other

## 2020-11-07 ENCOUNTER — Other Ambulatory Visit: Payer: Self-pay

## 2020-11-07 DIAGNOSIS — G4733 Obstructive sleep apnea (adult) (pediatric): Secondary | ICD-10-CM | POA: Diagnosis not present

## 2020-11-07 NOTE — Progress Notes (Signed)
His cpap starting making noise and was under warranty exchanged cpap for new Luna G3 Pt was seen by kelly AHP

## 2020-11-08 DIAGNOSIS — M545 Low back pain, unspecified: Secondary | ICD-10-CM | POA: Insufficient documentation

## 2020-11-08 DIAGNOSIS — M47812 Spondylosis without myelopathy or radiculopathy, cervical region: Secondary | ICD-10-CM | POA: Diagnosis not present

## 2020-11-09 ENCOUNTER — Other Ambulatory Visit: Payer: Self-pay

## 2020-11-09 ENCOUNTER — Emergency Department: Payer: Medicare HMO

## 2020-11-09 ENCOUNTER — Emergency Department
Admission: EM | Admit: 2020-11-09 | Discharge: 2020-11-09 | Disposition: A | Discharge status: Home / Self Care | Payer: Medicare HMO | Attending: Emergency Medicine | Admitting: Emergency Medicine

## 2020-11-09 DIAGNOSIS — R109 Unspecified abdominal pain: Secondary | ICD-10-CM | POA: Diagnosis not present

## 2020-11-09 DIAGNOSIS — K5730 Diverticulosis of large intestine without perforation or abscess without bleeding: Secondary | ICD-10-CM | POA: Diagnosis not present

## 2020-11-09 DIAGNOSIS — Z79899 Other long term (current) drug therapy: Secondary | ICD-10-CM | POA: Diagnosis not present

## 2020-11-09 DIAGNOSIS — R1084 Generalized abdominal pain: Secondary | ICD-10-CM | POA: Diagnosis not present

## 2020-11-09 DIAGNOSIS — K449 Diaphragmatic hernia without obstruction or gangrene: Secondary | ICD-10-CM | POA: Diagnosis not present

## 2020-11-09 DIAGNOSIS — E039 Hypothyroidism, unspecified: Secondary | ICD-10-CM | POA: Insufficient documentation

## 2020-11-09 DIAGNOSIS — K8020 Calculus of gallbladder without cholecystitis without obstruction: Secondary | ICD-10-CM | POA: Diagnosis not present

## 2020-11-09 LAB — URINALYSIS, COMPLETE (UACMP) WITH MICROSCOPIC
Bacteria, UA: NONE SEEN
Bilirubin Urine: NEGATIVE
Glucose, UA: NEGATIVE mg/dL
Hgb urine dipstick: NEGATIVE
Ketones, ur: NEGATIVE mg/dL
Leukocytes,Ua: NEGATIVE
Nitrite: NEGATIVE
Protein, ur: NEGATIVE mg/dL
Specific Gravity, Urine: 1.017 (ref 1.005–1.030)
pH: 8 (ref 5.0–8.0)

## 2020-11-09 LAB — CBC
HCT: 42.7 % (ref 39.0–52.0)
Hemoglobin: 14.8 g/dL (ref 13.0–17.0)
MCH: 30.6 pg (ref 26.0–34.0)
MCHC: 34.7 g/dL (ref 30.0–36.0)
MCV: 88.4 fL (ref 80.0–100.0)
Platelets: 186 10*3/uL (ref 150–400)
RBC: 4.83 MIL/uL (ref 4.22–5.81)
RDW: 12.3 % (ref 11.5–15.5)
WBC: 6.7 10*3/uL (ref 4.0–10.5)
nRBC: 0 % (ref 0.0–0.2)

## 2020-11-09 LAB — COMPREHENSIVE METABOLIC PANEL
ALT: 23 U/L (ref 0–44)
AST: 22 U/L (ref 15–41)
Albumin: 3.9 g/dL (ref 3.5–5.0)
Alkaline Phosphatase: 65 U/L (ref 38–126)
Anion gap: 7 (ref 5–15)
BUN: 20 mg/dL (ref 6–20)
CO2: 28 mmol/L (ref 22–32)
Calcium: 9.1 mg/dL (ref 8.9–10.3)
Chloride: 103 mmol/L (ref 98–111)
Creatinine, Ser: 0.74 mg/dL (ref 0.61–1.24)
GFR, Estimated: >60 mL/min (ref >60)
Glucose, Bld: 111 mg/dL — ABNORMAL HIGH (ref 70–99)
Potassium: 4.1 mmol/L (ref 3.5–5.1)
Sodium: 138 mmol/L (ref 135–145)
Total Bilirubin: 0.5 mg/dL (ref 0.3–1.2)
Total Protein: 8.2 g/dL — ABNORMAL HIGH (ref 6.5–8.1)

## 2020-11-09 LAB — LIPASE, BLOOD: Lipase: 38 U/L (ref 11–51)

## 2020-11-09 MED ORDER — IOHEXOL 350 MG/ML SOLN
100.0000 mL | Freq: Once | INTRAVENOUS | Status: AC | PRN
  Administered 2020-11-09: 100 mL via INTRAVENOUS
  Filled 2020-11-09: qty 100

## 2020-11-09 MED ORDER — MORPHINE SULFATE (PF) 4 MG/ML IV SOLN
4.0000 mg | Freq: Once | INTRAVENOUS | Status: AC
  Administered 2020-11-09: 4 mg via INTRAVENOUS
  Filled 2020-11-09: qty 1

## 2020-11-09 MED ORDER — ONDANSETRON HCL 4 MG/2ML IJ SOLN
4.0000 mg | Freq: Once | INTRAMUSCULAR | Status: AC
  Administered 2020-11-09: 4 mg via INTRAVENOUS
  Filled 2020-11-09: qty 2

## 2020-11-09 NOTE — ED Triage Notes (Signed)
Pt c/o central lower abd to right side pain for the past couple of days. Denies N/VD.Marland Kitchen

## 2020-11-09 NOTE — ED Provider Notes (Signed)
[facility] Emergency Department Provider Note  Time seen: 11:01 AM  I have reviewed the triage vital signs and the nursing notes.   HISTORY  Chief Complaint Abdominal Pain   HPI [name] [name] is a 60 y.o. male with a past medical history of arthritis, fibromyalgia, gastric reflux, diverticulitis, presents emergency department for left-sided abdominal pain.  According to the patient over the past month or 2 he has been experiencing intermittent pain to his left abdomen going around to the left back.  Patient states a history of chronic abdominal pain, has had recurrent diverticulitis has back issues and has an appointment with pain management scheduled but he is not yet seen them.  Patient states over the past several days he has had increased pain to the left abdomen and a feeling like he needs to defecate.  Patient denies any fever nausea or vomiting.  Denies any diarrhea cough congestion or shortness of breath.   Past Medical History:  Diagnosis Date   Anxiety    Arthritis    Depression    Diverticulitis    Fibromyalgia    GERD (gastroesophageal reflux disease)    Glaucoma    Hepatitis C    Juvenile epilepsy (HCC)    Positive TB test    Sleep apnea    Uses CPap machine    Patient Active Problem List   Diagnosis Date Noted   Chronic bilateral low back pain with bilateral sciatica 08/04/2020   GERD (gastroesophageal reflux disease)    Depression    Obesity, Class III, BMI 40-49.9 (morbid obesity) (HCC)    OSA (obstructive sleep apnea)    Acute diverticulitis 09/28/2019   Leukocytosis 09/28/2019   Lumbar disc disease with radiculopathy 09/28/2019   Calculus of gallbladder without cholecystitis without obstruction 09/28/2019   Encounter for general adult medical examination with abnormal findings 12/18/2018   Hypothyroidism 12/18/2018   Boil 12/18/2018   Rash 12/18/2018   Dysuria 12/18/2018   PTSD (post-traumatic stress disorder) 10/05/2018   MDD  (major depressive disorder), recurrent episode, moderate (HCC) 10/05/2018    Past Surgical History:  Procedure Laterality Date   LIVER BIOPSY      Prior to Admission medications   Medication Sig Start Date End Date Taking? Authorizing Provider  busPIRone (BUSPAR) 7.5 MG tablet TAKE 1 TABLET BY MOUTH 2 TIMES DAILY. 07/05/20   Lyndon Code, MD  Crisaborole (EUCRISA) 2 % OINT Apply 1 application topically 2 (two) times daily as needed. 02/01/19   Johnna Acosta, NP  cyclobenzaprine (FLEXERIL) 10 MG tablet Take 1 tablet (10 mg total) by mouth 3 (three) times daily as needed for muscle spasms. 08/24/20   Sallyanne Kuster, NP  levothyroxine (SYNTHROID) 75 MCG tablet Take 1 tablet (75 mcg total) by mouth daily. 03/12/20   McDonough, Salomon Fick, PA-C  NON FORMULARY cpap device    [provider]  omeprazole (PRILOSEC) 20 MG capsule TAKE 1 CAPSULE BY MOUTH EVERY DAY 07/05/20   McDonough, Salomon Fick, PA-C  oxycodone (OXY-IR) 5 MG capsule Take one tab po BID PRN ONLY 10/16/20   Lyndon Code, MD    Allergies  Allergen Reactions   Celecoxib    Zoloft [Sertraline Hcl] Anxiety    Family History  Problem Relation Age of Onset   Aneurysm Mother    Cancer Father    Mental illness Sister    Drug abuse Paternal Aunt    Drug abuse Maternal Uncle    Mental illness Sister  Social History Social History   Tobacco Use   Smoking status: Never   Smokeless tobacco: Never  Vaping Use   Vaping Use: Never used  Substance Use Topics   Alcohol use: Not Currently   Drug use: Not Currently    Review of Systems Constitutional: Negative for fever. Cardiovascular: Negative for chest pain. Respiratory: Negative for shortness of breath. Gastrointestinal: Moderate left sided abd pain Genitourinary: Negative for urinary compaints Musculoskeletal: Negative for musculoskeletal complaints Neurological: Negative for headache All other ROS  negative  ____________________________________________   PHYSICAL EXAM:  VITAL SIGNS: ED Triage Vitals  Enc Vitals Group     BP 11/09/20 1008 134/86     Pulse Rate 11/09/20 1008 65     Resp 11/09/20 1008 18     Temp 11/09/20 1008 97.8 F (36.6 C)     Temp Source 11/09/20 1008 Oral     SpO2 11/09/20 1008 96 %     Weight 11/09/20 1006 (!) 345 lb (156.5 kg)     Height 11/09/20 1006 6\' 1"  (1.854 m)     Head Circumference --      Peak Flow --      Pain Score 11/09/20 1006 6     Pain Loc --      Pain Edu? --      Excl. in GC? --    Constitutional: Alert and oriented. Well appearing and in no distress. Eyes: Normal exam ENT      Head: Normocephalic and atraumatic.      Mouth/Throat: Mucous membranes are moist. Cardiovascular: Normal rate, regular rhythm.  Respiratory: Normal respiratory effort without tachypnea nor retractions. Breath sounds are clear  Gastrointestinal: Soft, benign abdomen, obese.  No CVA tenderness. Musculoskeletal: Nontender with normal range of motion in all extremities.  Neurologic:  Normal speech and language. No gross focal neurologic deficits  Skin:  Skin is warm, dry and intact.  Psychiatric: Mood and affect are normal.   ____________________________________________    RADIOLOGY  CT scans negative for acute abnormality  ____________________________________________   INITIAL IMPRESSION / ASSESSMENT AND PLAN / ED COURSE  Pertinent labs & imaging results that were available during my care of the patient were reviewed by me and considered in my medical decision making (see chart for details).   Patient presents emergency department for 1 to 2 months of left-sided abdominal pain although acutely worse over the past several days.  Patient also has a feeling like he needs to defecate has had similar feelings with diverticulitis in the past.  Denies any nausea vomiting or known fever.  No black or bloody stool.  No dysuria or hematuria.  Patient's lab  work is reassuring including blood work and urinalysis.  Normal LFTs lipase and normal white blood cell count.  We will obtain CT imaging of the abdomen/pelvis to rule out diverticulitis, ureterolithiasis or other intra-abdominal pathology.  We will treat pain while awaiting results.  Patient agreeable to plan of care.  CT scans negative for acute abnormality.  Lab work is largely nonrevealing/reassuring.  Patient states he has pain medication at home, has an appointment coming up in 2 weeks with pain management.  Also has an appointment at the end of this month with GI medicine.  Patient will be discharged with outpatient follow-up.  Patient agreeable to plan of care.  [name] [name] was evaluated in Emergency Department on 11/09/2020 for the symptoms described in the history of present illness. He was evaluated in the context of the global COVID-19 pandemic,  which necessitated consideration that the patient might be at risk for infection with the SARS-CoV-2 virus that causes COVID-19. Institutional protocols and algorithms that pertain to the evaluation of patients at risk for COVID-19 are in a state of rapid change based on information released by regulatory bodies including the CDC and federal and state organizations. These policies and algorithms were followed during the patient's care in the ED.  ____________________________________________   FINAL CLINICAL IMPRESSION(S) / ED DIAGNOSES  Abdominal pain   Minna Antis, MD 11/09/20 1221

## 2020-11-14 DIAGNOSIS — R1110 Vomiting, unspecified: Secondary | ICD-10-CM | POA: Diagnosis not present

## 2020-11-14 DIAGNOSIS — Z8719 Personal history of other diseases of the digestive system: Secondary | ICD-10-CM | POA: Diagnosis not present

## 2020-11-15 ENCOUNTER — Telehealth: Payer: Self-pay

## 2020-11-15 ENCOUNTER — Other Ambulatory Visit: Payer: Self-pay

## 2020-11-15 ENCOUNTER — Encounter: Payer: Self-pay | Admitting: Nurse Practitioner

## 2020-11-15 ENCOUNTER — Ambulatory Visit (INDEPENDENT_AMBULATORY_CARE_PROVIDER_SITE_OTHER): Payer: Medicaid Other | Admitting: Nurse Practitioner

## 2020-11-15 VITALS — BP 120/72 | HR 65 | Temp 98.7°F | Resp 16 | Ht 73.0 in | Wt 349.6 lb

## 2020-11-15 DIAGNOSIS — G8929 Other chronic pain: Secondary | ICD-10-CM | POA: Diagnosis not present

## 2020-11-15 DIAGNOSIS — R112 Nausea with vomiting, unspecified: Secondary | ICD-10-CM

## 2020-11-15 DIAGNOSIS — M5442 Lumbago with sciatica, left side: Secondary | ICD-10-CM | POA: Diagnosis not present

## 2020-11-15 DIAGNOSIS — R1084 Generalized abdominal pain: Secondary | ICD-10-CM | POA: Diagnosis not present

## 2020-11-15 DIAGNOSIS — M5441 Lumbago with sciatica, right side: Secondary | ICD-10-CM

## 2020-11-15 NOTE — Telephone Encounter (Signed)
Patient will call back to schedule 11/15/20 f/u-Toni 

## 2020-11-15 NOTE — Progress Notes (Signed)
[facility] [address] [place], [place] [address]  Internal MEDICINE  Office Visit Note  Patient Name: [name] [name]  [id number]  [id number]  Date of Service: 11/15/2020  Chief Complaint  Patient presents with   Follow-up    Review labs, arthritis in neck, abd pain   Depression   Gastroesophageal Reflux   Sleep Apnea   Anxiety    HPI [name] [name] presents for a follow up visit to review labs, and discuss neck and abdominal pain. [name] [name] has chronic back pain and is scheduled to see a pain doctor/spinal doctor at [facility] on 10/10.  He was bit by a tick a few months ago. His labs for lyme disease were drawn again but still came back negative. He is also having an issue with nausea and vomiting, dry heaving, abomdinal pain, no diarrhea, bu always feels like he has to go. Drinking milk makes the pain worse.  He was seen by Dr. [name] with gastroenterology at [facility] clinic yesterday. Dr. [name] has [name] scheduled for upper endoscopy and colonoscopy in December. He has a history of complicated diverticulitis.     Current Medication: Outpatient Encounter Medications as of 11/15/2020  Medication Sig   busPIRone (BUSPAR) 7.5 MG tablet TAKE 1 TABLET BY MOUTH 2 TIMES DAILY.   Crisaborole (EUCRISA) 2 % OINT Apply 1 application topically 2 (two) times daily as needed.   cyclobenzaprine (FLEXERIL) 10 MG tablet Take 1 tablet (10 mg total) by mouth 3 (three) times daily as needed for muscle spasms.   HYDROcodone-acetaminophen (NORCO) 7.5-325 MG tablet TAKE 1 TABLET BY MOUTH EVERY 6 HOURS FOR 5 DAYS   levothyroxine (SYNTHROID) 75 MCG tablet Take 1 tablet (75 mcg total) by mouth daily.   NON FORMULARY cpap device   omeprazole (PRILOSEC) 20 MG capsule TAKE 1 CAPSULE BY MOUTH EVERY DAY   oxycodone (OXY-IR) 5 MG capsule Take one tab po BID PRN ONLY   No facility-administered encounter medications on file as of 11/15/2020.    Surgical History: Past Surgical History:  Procedure  Laterality Date   LIVER BIOPSY      Medical History: Past Medical History:  Diagnosis Date   Anxiety    Arthritis    Depression    Diverticulitis    Fibromyalgia    GERD (gastroesophageal reflux disease)    Glaucoma    Hepatitis C    Juvenile epilepsy (HCC)    Positive TB test    Sleep apnea    Uses CPap machine    Family History: Family History  Problem Relation Age of Onset   Aneurysm Mother    Cancer Father    Mental illness Sister    Drug abuse Paternal Aunt    Drug abuse Maternal Uncle    Mental illness Sister     Social History   Socioeconomic History   Marital status: Married    Spouse name: Not on file   Number of children: 1   Years of education: Not on file   Highest education level: Not on file  Occupational History   Not on file  Tobacco Use   Smoking status: Never   Smokeless tobacco: Never  Vaping Use   Vaping Use: Never used  Substance and Sexual Activity   Alcohol use: Not Currently   Drug use: Not Currently   Sexual activity: Not on file  Other Topics Concern   Not on file  Social History Narrative   ** Merged History Encounter **  Social Determinants of Health   Financial Resource Strain: Not on file  Food Insecurity: Not on file  Transportation Needs: Not on file  Physical Activity: Not on file  Stress: Not on file  Social Connections: Not on file  Intimate Partner Violence: Not on file      Review of Systems  Constitutional:  Negative for chills, fatigue and unexpected weight change.  HENT:  Negative for congestion, rhinorrhea, sneezing and sore throat.   Eyes:  Negative for redness.  Respiratory:  Negative for cough, chest tightness and shortness of breath.   Cardiovascular:  Negative for chest pain and palpitations.  Gastrointestinal:  Positive for abdominal distention, abdominal pain, constipation, nausea and vomiting. Negative for diarrhea.  Genitourinary:  Negative for dysuria and frequency.  Musculoskeletal:   Positive for arthralgias, back pain, gait problem, neck pain and neck stiffness. Negative for joint swelling.  Skin:  Negative for rash.  Neurological:  Negative for tremors and numbness.  Hematological:  Negative for adenopathy. Does not bruise/bleed easily.  Psychiatric/Behavioral:  Negative for behavioral problems (Depression), self-injury, sleep disturbance and suicidal ideas. The patient is not nervous/anxious.    Vital Signs: BP 120/72   Pulse 65   Temp 98.7 F (37.1 C)   Resp 16   Ht 6\' 1"  (1.854 m)   Wt (!) 349 lb 9.6 oz (158.6 kg)   SpO2 98%   BMI 46.12 kg/m    Physical Exam Vitals reviewed.  Constitutional:      General: He is not in acute distress.    Appearance: Normal appearance. He is obese. He is not ill-appearing.  HENT:     Head: Normocephalic and atraumatic.  Eyes:     Extraocular Movements: Extraocular movements intact.     Pupils: Pupils are equal, round, and reactive to light.  Cardiovascular:     Rate and Rhythm: Normal rate and regular rhythm.  Pulmonary:     Effort: Pulmonary effort is normal. No respiratory distress.  Neurological:     Mental Status: He is alert and oriented to person, place, and time.     Cranial Nerves: No cranial nerve deficit.     Coordination: Coordination normal.     Gait: Gait normal.  Psychiatric:        Mood and Affect: Mood normal.        Behavior: Behavior normal.       Assessment/Plan: 1. Chronic bilateral low back pain with bilateral sciatica Has been referred to pain management. Has appointment on 11/26/20  2. Generalized abdominal pain Having abdominal pain and nausea and vomiting, scheduled for endoscopy in December. Seen by Dr. January yesterday. He wants to see if his endoscopy can be moved up to an earlier date.   3. Nausea and vomiting, unspecified vomiting type See problem #2   General Counseling: [name] verbalizes understanding of the findings of todays visit and agrees with plan of treatment. I  have discussed any further diagnostic evaluation that may be needed or ordered today. We also reviewed his medications today. he has been encouraged to call the office with any questions or concerns that should arise related to todays visit.    No orders of the defined types were placed in this encounter.   No orders of the defined types were placed in this encounter.   Return in about 3 months (around 02/14/2021) for F/U, med refill, Saleena Tamas PCP.   Total time spent:20 Minutes Time spent includes review of chart, medications, test results, and follow up plan  with the patient.   [place] Controlled Substance Database was reviewed by me.  This patient was seen by Jonetta Osgood, FNP-C in collaboration with Dr. Clayborn Bigness as a part of collaborative care agreement.   Jaymeson Mengel R. Valetta Fuller, MSN, FNP-C Internal medicine

## 2020-11-26 DIAGNOSIS — M545 Low back pain, unspecified: Secondary | ICD-10-CM | POA: Diagnosis not present

## 2020-11-26 DIAGNOSIS — M47812 Spondylosis without myelopathy or radiculopathy, cervical region: Secondary | ICD-10-CM | POA: Diagnosis not present

## 2020-12-01 ENCOUNTER — Other Ambulatory Visit: Payer: Self-pay | Admitting: Internal Medicine

## 2020-12-01 DIAGNOSIS — F339 Major depressive disorder, recurrent, unspecified: Secondary | ICD-10-CM

## 2020-12-13 ENCOUNTER — Ambulatory Visit: Payer: Medicare Other | Admitting: Hospice and Palliative Medicine

## 2020-12-20 DIAGNOSIS — G4733 Obstructive sleep apnea (adult) (pediatric): Secondary | ICD-10-CM | POA: Diagnosis not present

## 2020-12-20 NOTE — Progress Notes (Incomplete)
Chronic Care Management Pharmacy Note  12/20/2020 Name:  [name] [name] MRN:  [id number] DOB:  1960-05-04  Summary: ***  Recommendations/Changes made from today's visit: ***  Plan: ***   Subjective: [name] [name] is an 60 y.o. year old male who is a primary patient of Jonetta Osgood, NP.  The CCM team was consulted for assistance with disease management and care coordination needs.    Engaged with patient by telephone for initial visit in response to provider referral for pharmacy case management and/or care coordination services.   Consent to Services:  The patient was given the following information about Chronic Care Management services today, agreed to services, and gave verbal consent: 1. CCM service includes personalized support from designated clinical staff supervised by the primary care provider, including individualized plan of care and coordination with other care providers 2. 24/7 contact phone numbers for assistance for urgent and routine care needs. 3. Service will only be billed when office clinical staff spend 20 minutes or more in a month to coordinate care. 4. Only one practitioner may furnish and bill the service in a calendar month. 5.The patient may stop CCM services at any time (effective at the end of the month) by phone call to the office staff. 6. The patient will be responsible for cost sharing (co-pay) of up to 20% of the service fee (after annual deductible is met). Patient agreed to services and consent obtained.  Patient Care Team: Jonetta Osgood, NP as PCP - General (Nurse Practitioner) [place] Pharmacist  Recent office visits: 11/15/20 Jonetta Osgood, NP. For follow-up. No medication changes.  09/24/20 Jonetta Osgood, NP. For follow-up. No medication changes.  08/24/20 Jonetta Osgood, NP. For follow-up. STARTED Cyclobenzaprine 10 mg 3 times daily PRN.  07/27/20 Jonetta Osgood, NP. For acute visit. STARTED Doxycycline  Hyclate 100 mg 2 times daily, and Meloxicam 15 mg daily.  06/25/20 McDonough, Si Gaul, PA-C. For follow-up. No medication changes.  Recent consult visits: 11/14/20 Tiffany Kocher, Leveda Anna, MD For inial visit. STOPPED iron supplements  07/31/20 Family Medicine Angus Palms. 07/31/20 Interventional Radiology Amedeo Plenty visits: 11/09/20 [facility] ER Department ( 1 Hours) Harvest Dark, MD. For abdominal pain. No medication changes.    Objective:  Lab Results  Component Value Date   CREATININE 0.74 11/09/2020   BUN 20 11/09/2020   GFRNONAA >60 11/09/2020   GFRAA 116 08/04/2018   NA 138 11/09/2020   K 4.1 11/09/2020   CALCIUM 9.1 11/09/2020   CO2 28 11/09/2020   GLUCOSE 111 (H) 11/09/2020    Lab Results  Component Value Date/Time   HGBA1C 5.6 03/12/2020 02:43 PM   HGBA1C 5.7 (H) 08/04/2018 01:51 PM    Last diabetic Eye exam: No results found for: HMDIABEYEEXA  Last diabetic Foot exam: No results found for: HMDIABFOOTEX   Lab Results  Component Value Date   CHOL 168 08/04/2018   HDL 40 08/04/2018   LDLCALC 101 (H) 08/04/2018   TRIG 137 08/04/2018    Hepatic Function Latest Ref Rng & Units 11/09/2020 07/27/2020 04/07/2020  Total Protein 6.5 - 8.1 g/dL 8.2(H) 8.2 7.4  Albumin 3.5 - 5.0 g/dL 3.9 4.3 3.3(L)  AST 15 - 41 U/L _0 ALT 0 - 44 U/L _1 Alk Phosphatase 38 - 126 U/L 65 87 47  Total Bilirubin 0.3 - 1.2 mg/dL 0.5 0.5 1.5(H)    Lab Results  Component Value Date/Time   TSH 0.606 01/25/2020 03:48  PM   TSH 1.040 04/11/2019 03:55 PM   FREET4 1.14 01/25/2020 03:48 PM   FREET4 0.84 04/11/2019 03:55 PM    CBC Latest Ref Rng & Units 11/09/2020 07/27/2020 04/07/2020  WBC 4.0 - 10.5 K/uL 6.7 8.8 8.6  Hemoglobin 13.0 - 17.0 g/dL 14.8 14.6 12.5(L)  Hematocrit 39.0 - 52.0 % 42.7 43.8 38.1(L)  Platelets 150 - 400 K/uL 186 220 175    No results found for: VD25OH  Clinical ASCVD:  The 10-year ASCVD risk score  (Arnett DK, et al., 2019) is: 6%   Values used to calculate the score:     Age: 12 years     Sex: Male     Is Non-Hispanic African American: No     Diabetic: No     Tobacco smoker: No     Systolic Blood Pressure: 371 mmHg     Is BP treated: No     HDL Cholesterol: 40 mg/dL     Total Cholesterol: 168 mg/dL    Depression screen [facility] 2/9 09/24/2020 08/24/2020 04/23/2020  Decreased Interest 0 3 3  Down, Depressed, Hopeless _0 PHQ - 2 Score _1 Altered sleeping - - -  Tired, decreased energy - - -  Change in appetite - - -  Feeling bad or failure about yourself  - - -  Trouble concentrating - - -  Moving slowly or fidgety/restless - - -  Suicidal thoughts - - -  PHQ-9 Score - - -  Difficult doing work/chores - - -       Social History   Tobacco Use  Smoking Status Never  Smokeless Tobacco Never   BP Readings from Last 3 Encounters:  11/15/20 120/72  11/09/20 133/77  09/24/20 134/64   Pulse Readings from Last 3 Encounters:  11/15/20 65  11/09/20 65  09/24/20 76   Wt Readings from Last 3 Encounters:  11/15/20 (!) 349 lb 9.6 oz (158.6 kg)  11/09/20 (!) 345 lb (156.5 kg)  09/24/20 (!) 344 lb 6.4 oz (156.2 kg)   BMI Readings from Last 3 Encounters:  11/15/20 46.12 kg/m  11/09/20 45.52 kg/m  09/24/20 45.44 kg/m    Assessment/Interventions: Review of patient past medical history, allergies, medications, health status, including review of consultants reports, laboratory and other test data, was performed as part of comprehensive evaluation and provision of chronic care management services.   SDOH:  (Social Determinants of Health) assessments and interventions performed: Yes  SDOH Screenings   Alcohol Screen: Low Risk    Last Alcohol Screening Score (AUDIT): 0  Depression (PHQ2-9): Low Risk    PHQ-2 Score: 1  Financial Resource Strain: Not on file  Food Insecurity: Not on file  Housing: Not on file  Physical Activity: Not on file  Social Connections: Not on  file  Stress: Not on file  Tobacco Use: Low Risk    Smoking Tobacco Use: Never   Smokeless Tobacco Use: Never   Passive Exposure: Not on file  Transportation Needs: Not on file    CCM Care Plan  Allergies  Allergen Reactions   Celecoxib    Zoloft [Sertraline Hcl] Anxiety    Medications Reviewed Today     Reviewed by Tora Perches on 09/24/20 at 1420  Med List Status: <None>   Medication Order Taking? Sig Documenting Provider Last Dose Status Informant  busPIRone (BUSPAR) 7.5 MG tablet [id number] Yes TAKE 1 TABLET BY MOUTH 2 TIMES DAILY. Lavera Guise, MD Taking Active  Crisaborole (EUCRISA) 2 % OINT [id number] Yes Apply 1 application topically 2 (two) times daily as needed. Kendell Bane, NP Taking Active Self  cyclobenzaprine (FLEXERIL) 10 MG tablet [id number] Yes Take 1 tablet (10 mg total) by mouth 3 (three) times daily as needed for muscle spasms. Jonetta Osgood, NP Taking Active   levothyroxine (SYNTHROID) 75 MCG tablet [id number] Yes Take 1 tablet (75 mcg total) by mouth daily. Mylinda Latina, PA-C Taking Active Self  Baruch Gouty [id number] Yes cpap device [provider] Taking Active Self  omeprazole (PRILOSEC) 20 MG capsule [id number] Yes TAKE 1 CAPSULE BY MOUTH EVERY DAY McDonough, Lauren K, PA-C Taking Active   oxyCODONE (OXY IR/ROXICODONE) 5 MG immediate release tablet [id number] Yes Take 1 tablet (5 mg total) by mouth every 4 (four) hours as needed for severe pain or breakthrough pain. Jonetta Osgood, NP Taking Active             Patient Active Problem List   Diagnosis Date Noted   Chronic bilateral low back pain with bilateral sciatica 08/04/2020   GERD (gastroesophageal reflux disease)    Depression    Obesity, Class III, BMI 40-49.9 (morbid obesity) (HCC)    OSA (obstructive sleep apnea)    Acute diverticulitis 09/28/2019   Leukocytosis 09/28/2019   Lumbar disc disease with radiculopathy 09/28/2019   Calculus of gallbladder without  cholecystitis without obstruction 09/28/2019   Encounter for general adult medical examination with abnormal findings 12/18/2018   Hypothyroidism 12/18/2018   Boil 12/18/2018   Rash 12/18/2018   Dysuria 12/18/2018   PTSD (post-traumatic stress disorder) 10/05/2018   MDD (major depressive disorder), recurrent episode, moderate ([place]) 10/05/2018    Immunization History  Administered Date(s) Administered   Influenza Inj Mdck Quad Pf 12/06/2019    Conditions to be addressed/monitored:  GERD, Hypothyroidism, Depression, and Obesity, Diverticulitis, and Chronic Pain  There are no care plans that you recently modified to display for this patient.    Medication Assistance: {MEDASSISTANCEINFO:25044}  Compliance/Adherence/Medication fill history: Care Gaps: Shingrix, Tetanus, and flu vaccine  Star-Rating Drugs: None  Patient's preferred pharmacy is:  [id number] - [place], [place] - [address] [place] [address] Phone: [phone] Fax: [phone]  Uses pill box? {Yes or If no, why not?:20788} Pt endorses ***% compliance  We discussed: {Pharmacy options:24294} Patient decided to: {US Pharmacy Plan:23885}  Care Plan and Follow Up Patient Decision:  {FOLLOWUP:24991}  Plan: {CM FOLLOW UP AGTX:64680}  *** Current Barriers:  {pharmacybarriers:24917}  Pharmacist Clinical Goal(s):  Patient will {PHARMACYGOALCHOICES:24921} through collaboration with PharmD and provider.   Interventions: 1:1 collaboration with Jonetta Osgood, NP regarding development and update of comprehensive plan of care as evidenced by provider attestation and co-signature Inter-disciplinary care team collaboration (see longitudinal plan of care) Comprehensive medication review performed; medication list updated in electronic medical record  Depression/Anxiety (Goal: Good mental health) -{US controlled/uncontrolled:25276} -Current treatment: Buspirone 7.5mg   tablet -Medications previously tried/failed: Xanax, abilify -PHQ9: *** -GAD7: *** -Educated on {CCM mental health counseling:25127} -{CCMPHARMDINTERVENTION:25122}  Obesity (Goal: Achieve normal weight BMI) -{US controlled/uncontrolled:25276} -Current treatment  None -Medications previously tried: Phentermine 37.5mg  capsules  -{CCMPHARMDINTERVENTION:25122}  GERD (Goal: reduce acid reflux episodes) -Controlled/Uncontrolled -Current treatment:  Omeprazole 20mg  capsule -Medications previously tried:   Hypothyroidism (Goal: normal TSH) -Controlled/Uncontrolled -Current treatment:  Levothyroxine 70mcg -Medications previously tried:  Diverticulitis (Goal: reduce stomach pain, avoid hospitalization) -Controlled/Uncontrolled -Current treatment: none noted  Chronic Pain (Goal: control pain and have normal daily activity) -Controlled/Uncontrolled -Current treamtnet:  Norco  7.5-325mg 1 every 6 hours, still taking?  Oxycodone 64m capsule 1 twice daily as needed only    Patient Goals/Self-Care Activities Patient will:  - {pharmacypatientgoals:24919}  Follow Up Plan: {CM FOLLOW UP PQDIY:64158}

## 2020-12-24 ENCOUNTER — Telehealth: Payer: Self-pay | Admitting: Pharmacist

## 2020-12-24 NOTE — Progress Notes (Signed)
  Chronic Care Management Pharmacy Assistant   Name: [name] [name]  MRN: [id number] DOB: 1Feb 08, 1962  [name] is an 60 y.o. year old male who presents for his initial CCM visit with the clinical pharmacist.  Reason for Encounter: Chart Prep    Conditions to be addressed/monitored: GERD, Hypothyroidism, Depression  Primary concerns for visit include: Diverticulitis.   Recent office visits:  11/15/20 Sallyanne Kuster, NP. For follow-up. No medication changes.  09/24/20 Sallyanne Kuster, NP. For follow-up. No medication changes.  08/24/20 Sallyanne Kuster, NP. For follow-up. STARTED Cyclobenzaprine 10 mg 3 times daily PRN.  07/27/20 Sallyanne Kuster, NP. For acute visit. STARTED Doxycycline Hyclate 100 mg 2 times daily, and Meloxicam 15 mg daily.  06/25/20 McDonough, Salomon Fick, PA-C. For follow-up. No medication changes.  Recent consult visits:  11/14/20 Jerrye Noble, Janit Bern, MD For inial visit. STOPPED iron supplements  07/31/20 Family Medicine Josefina Do. 07/31/20 Interventional Radiology Waverly Ferrari visits:  11/09/20 [name] ER Department ( 1 Hours) Minna Antis, MD. For abdominal pain. No medication changes.   Medication Changes: N/A.  Medications: Outpatient Encounter Medications as of 12/24/2020  Medication Sig   busPIRone (BUSPAR) 7.5 MG tablet TAKE 1 TABLET BY MOUTH TWICE A DAY   Crisaborole (EUCRISA) 2 % OINT Apply 1 application topically 2 (two) times daily as needed.   cyclobenzaprine (FLEXERIL) 10 MG tablet Take 1 tablet (10 mg total) by mouth 3 (three) times daily as needed for muscle spasms.   HYDROcodone-acetaminophen (NORCO) 7.5-325 MG tablet TAKE 1 TABLET BY MOUTH EVERY 6 HOURS FOR 5 DAYS   levothyroxine (SYNTHROID) 75 MCG tablet Take 1 tablet (75 mcg total) by mouth daily.   NON FORMULARY cpap device   omeprazole (PRILOSEC) 20 MG capsule TAKE 1 CAPSULE BY MOUTH EVERY DAY   oxycodone (OXY-IR) 5 MG capsule Take one  tab po BID PRN ONLY   No facility-administered encounter medications on file as of 12/24/2020.   Have you seen any other providers since your last visit? Patients wife stated no.   Any changes in your medications or health? Patients wife stated no.   Any side effects from any medications? Patients wife stated no.   Do you have an symptoms or problems not managed by your medications? Patients wife stated no.   Any concerns about your health right now? Patients wife stated his back pain and diverticulitis.   Has your provider asked that you check blood pressure, blood sugar, or follow special diet at home? Patients wife stated no.   Do you get any type of exercise on a regular basis? Patients wife stated no.   Can you think of a goal you would like to reach for your health? Patients wife stated having less pain on a daily basics.   Do you have any problems getting your medications? Patients wife stated no.   Is there anything that you would like to discuss during the appointment? Patients wife stated no.   Please bring medications and supplements to appointment, patients wife reminded that he had a face to face appointment on 12/27/31 at 11 am.   Follow-Up:Pharmacist Review  Hulen Luster, RMA Clinical Pharmacist Assistant [phone]

## 2020-12-26 ENCOUNTER — Telehealth: Payer: Medicaid Other | Admitting: Student-PharmD

## 2020-12-27 DIAGNOSIS — M5416 Radiculopathy, lumbar region: Secondary | ICD-10-CM | POA: Diagnosis not present

## 2020-12-27 DIAGNOSIS — Z79891 Long term (current) use of opiate analgesic: Secondary | ICD-10-CM | POA: Diagnosis not present

## 2020-12-27 DIAGNOSIS — Z5181 Encounter for therapeutic drug level monitoring: Secondary | ICD-10-CM | POA: Diagnosis not present

## 2020-12-27 DIAGNOSIS — Z79899 Other long term (current) drug therapy: Secondary | ICD-10-CM | POA: Diagnosis not present

## 2021-01-15 NOTE — Progress Notes (Signed)
Chronic Care Management Pharmacy Note  01/16/2021 Name:  [name] [name] MRN:  [id number] DOB:  1May 06, 1962  Summary: Patient's primary focus is his pain. He is seen at pain clinic and will begin aquatic therapy soon. Patient reports episodes of vomiting that he thinks may be related to diverticulitis; has a colonoscopy scheduled Counseled on importance of activity and a high fiber diet to avoid diverticulitis flares.  Recommendations/Changes made from today's visit: Recommend starting fiber gummies if powder is not tolerated Try beginning chair exercises as a way to begin activity (educational handout attached to care plan)  Plan: F/U with pharmacist in 05/2021   Subjective: [name] [name] is an 60 y.o. year old male who is a primary patient of Jonetta Osgood, NP.  The CCM team was consulted for assistance with disease management and care coordination needs.    Engaged with patient by telephone for initial visit in response to provider referral for pharmacy case management and/or care coordination services.   Consent to Services:  The patient was given the following information about Chronic Care Management services today, agreed to services, and gave verbal consent: 1. CCM service includes personalized support from designated clinical staff supervised by the primary care provider, including individualized plan of care and coordination with other care providers 2. 24/7 contact phone numbers for assistance for urgent and routine care needs. 3. Service will only be billed when office clinical staff spend 20 minutes or more in a month to coordinate care. 4. Only one practitioner may furnish and bill the service in a calendar month. 5.The patient may stop CCM services at any time (effective at the end of the month) by phone call to the office staff. 6. The patient will be responsible for cost sharing (co-pay) of up to 20% of the service fee (after annual deductible is met). Patient agreed to  services and consent obtained.  Patient Care Team: Jonetta Osgood, NP as PCP - General (Nurse Practitioner) [place] Pharmacist  Recent office visits: 11/15/20 Jonetta Osgood, NP. For follow-up. No medication changes.  09/24/20 Jonetta Osgood, NP. For follow-up. No medication changes.  08/24/20 Jonetta Osgood, NP. For follow-up. STARTED Cyclobenzaprine 10 mg 3 times daily PRN.  07/27/20 Jonetta Osgood, NP. For acute visit. STARTED Doxycycline Hyclate 100 mg 2 times daily, and Meloxicam 15 mg daily.  06/25/20 McDonough, Si Gaul, PA-C. For follow-up. No medication changes.  Recent consult visits: 11/14/20 Tiffany Kocher, Leveda Anna, MD For inial visit. STOPPED iron supplements  07/31/20 Family Medicine [facility]. 07/31/20 Interventional Radiology Amedeo Plenty visits: 11/09/20 [facility] ER Department ( 1 Hours) Harvest Dark, MD. For abdominal pain. No medication changes.    Objective:  Lab Results  Component Value Date   CREATININE 0.74 11/09/2020   BUN 20 11/09/2020   GFRNONAA >60 11/09/2020   GFRAA 116 08/04/2018   NA 138 11/09/2020   K 4.1 11/09/2020   CALCIUM 9.1 11/09/2020   CO2 28 11/09/2020   GLUCOSE 111 (H) 11/09/2020    Lab Results  Component Value Date/Time   HGBA1C 5.6 03/12/2020 02:43 PM   HGBA1C 5.7 (H) 08/04/2018 01:51 PM    Last diabetic Eye exam: No results found for: HMDIABEYEEXA  Last diabetic Foot exam: No results found for: HMDIABFOOTEX   Lab Results  Component Value Date   CHOL 168 08/04/2018   HDL 40 08/04/2018   LDLCALC 101 (H) 08/04/2018   TRIG 137 08/04/2018    Hepatic Function Latest Ref Rng &  Units 11/09/2020 07/27/2020 04/07/2020  Total Protein 6.5 - 8.1 g/dL 8.2(H) 8.2 7.4  Albumin 3.5 - 5.0 g/dL 3.9 4.3 3.3(L)  AST 15 - 41 U/L 22 17 18   ALT 0 - 44 U/L 23 24 16   Alk Phosphatase 38 - 126 U/L 65 87 47  Total Bilirubin 0.3 - 1.2 mg/dL 0.5 0.5 1.5(H)    Lab  Results  Component Value Date/Time   TSH 0.606 01/25/2020 03:48 PM   TSH 1.040 04/11/2019 03:55 PM   FREET4 1.14 01/25/2020 03:48 PM   FREET4 0.84 04/11/2019 03:55 PM    CBC Latest Ref Rng & Units 11/09/2020 07/27/2020 04/07/2020  WBC 4.0 - 10.5 K/uL 6.7 8.8 8.6  Hemoglobin 13.0 - 17.0 g/dL 14.8 14.6 12.5(L)  Hematocrit 39.0 - 52.0 % 42.7 43.8 38.1(L)  Platelets 150 - 400 K/uL 186 220 175    No results found for: VD25OH  Clinical ASCVD:  The 10-year ASCVD risk score (Arnett DK, et al., 2019) is: 6.5%   Values used to calculate the score:     Age: 60 years     Sex: Male     Is Non-Hispanic African American: No     Diabetic: No     Tobacco smoker: No     Systolic Blood Pressure: 401 mmHg     Is BP treated: No     HDL Cholesterol: 40 mg/dL     Total Cholesterol: 168 mg/dL    Depression screen [facility] 2/9 09/24/2020 08/24/2020 04/23/2020  Decreased Interest 0 3 3  Down, Depressed, Hopeless 1 3 3   PHQ - 2 Score 1 6 6   Altered sleeping - - -  Tired, decreased energy - - -  Change in appetite - - -  Feeling bad or failure about yourself  - - -  Trouble concentrating - - -  Moving slowly or fidgety/restless - - -  Suicidal thoughts - - -  PHQ-9 Score - - -  Difficult doing work/chores - - -       Social History   Tobacco Use  Smoking Status Never  Smokeless Tobacco Never   BP Readings from Last 3 Encounters:  11/15/20 120/72  11/09/20 133/77  09/24/20 134/64   Pulse Readings from Last 3 Encounters:  11/15/20 65  11/09/20 65  09/24/20 76   Wt Readings from Last 3 Encounters:  11/15/20 (!) 349 lb 9.6 oz (158.6 kg)  11/09/20 (!) 345 lb (156.5 kg)  09/24/20 (!) 344 lb 6.4 oz (156.2 kg)   BMI Readings from Last 3 Encounters:  11/15/20 46.12 kg/m  11/09/20 45.52 kg/m  09/24/20 45.44 kg/m    Assessment/Interventions: Review of patient past medical history, allergies, medications, health status, including review of consultants reports, laboratory and other test data,  was performed as part of comprehensive evaluation and provision of chronic care management services.   SDOH:  (Social Determinants of Health) assessments and interventions performed: Yes  SDOH Screenings   Alcohol Screen: Low Risk    Last Alcohol Screening Score (AUDIT): 0  Depression (PHQ2-9): Low Risk    PHQ-2 Score: 1  Financial Resource Strain: Not on file  Food Insecurity: Not on file  Housing: Not on file  Physical Activity: Not on file  Social Connections: Not on file  Stress: Not on file  Tobacco Use: Low Risk    Smoking Tobacco Use: Never   Smokeless Tobacco Use: Never   Passive Exposure: Not on file  Transportation Needs: Not on file    CCM  Care Plan  Allergies  Allergen Reactions   Celecoxib    Zoloft [Sertraline Hcl] Anxiety    Medications Reviewed Today     Reviewed by Alena Bills, [facility] (Pharmacist) on 01/16/21 at 1723  Med List Status: <None>   Medication Order Taking? Sig Documenting Provider Last Dose Status Informant  busPIRone (BUSPAR) 7.5 MG tablet [id number]  TAKE 1 TABLET BY MOUTH TWICE A DAY Abernathy, Alyssa, NP  Active   Crisaborole (EUCRISA) 2 % OINT [id number]  Apply 1 application topically 2 (two) times daily as needed. Kendell Bane, NP  Active Self  cyclobenzaprine (FLEXERIL) 10 MG tablet [id number] No Take 1 tablet (10 mg total) by mouth 3 (three) times daily as needed for muscle spasms.  Patient not taking: Reported on 01/16/2021   Jonetta Osgood, NP Not Taking Active   HYDROcodone-acetaminophen (NORCO) 7.5-325 MG tablet [id number] No TAKE 1 TABLET BY MOUTH EVERY 6 HOURS FOR 5 DAYS  Patient not taking: Reported on 01/16/2021   [provider] Not Taking Active   levothyroxine (SYNTHROID) 75 MCG tablet [id number]  Take 1 tablet (75 mcg total) by mouth daily. McDonough, Si Gaul, PA-C  Active Self  naproxen (NAPROSYN) 500 MG tablet [id number] Yes Take 500 mg by mouth 2 (two) times daily. [provider]  Active   Baruch Gouty [id number]  cpap device [provider]  Active Self  omeprazole (PRILOSEC) 20 MG capsule [id number]  TAKE 1 CAPSULE BY MOUTH EVERY DAY McDonough, Lauren K, PA-C  Active   oxycodone (OXY-IR) 5 MG capsule [id number] No Take one tab po BID PRN ONLY  Patient not taking: Reported on 01/16/2021   Lavera Guise, MD Not Taking Active   PREGABALIN PO [id number] Yes Take 100 mg by mouth in the morning and at bedtime. 2 47m caps twice daily [provider]  Active Self  TIZANIDINE HCL PO [id number] Take 4 mg by mouth 2 (two) times daily as needed. [provider] Taking Active Self            Patient Active Problem List   Diagnosis Date Noted   Chronic bilateral low back pain with bilateral sciatica 08/04/2020   GERD (gastroesophageal reflux disease)    Depression    Obesity, Class III, BMI 40-49.9 (morbid obesity) ([place]    OSA (obstructive sleep apnea)    Acute diverticulitis 09/28/2019   Leukocytosis 09/28/2019   Lumbar disc disease with radiculopathy 09/28/2019   Calculus of gallbladder without cholecystitis without obstruction 09/28/2019   Encounter for general adult medical examination with abnormal findings 12/18/2018   Hypothyroidism 12/18/2018   Boil 12/18/2018   Rash 12/18/2018   Dysuria 12/18/2018   PTSD (post-traumatic stress disorder) 10/05/2018   MDD (major depressive disorder), recurrent episode, moderate ([facility] 10/05/2018    Immunization History  Administered Date(s) Administered   Influenza Inj Mdck Quad Pf 12/06/2019    Conditions to be addressed/monitored:  GERD, Hypothyroidism, Depression, and Obesity, Diverticulitis, and Chronic Pain  Care Plan : [place] Updates made by HAlena Bills RHoustonsince 01/16/2021 12:00 AM     Problem: Chronic Pain, Hypothyroidism, Obesity, GERD, Depression/Anxiety   Priority: High  Onset Date: 01/16/2021     Long-Range Goal: Disease Managment   Start Date: 01/16/2021   Expected End Date: 01/16/2022  This Visit's Progress: On track  Priority: High  Note:   Current Barriers:  Pain is uncontrolled and limiting his activity and overall health. Currently being seen at pain clinic and will  be starting therapy to try to overcome.  Pharmacist Clinical Goal(s):  Patient will achieve adherence to monitoring guidelines and medication adherence to achieve therapeutic efficacy through collaboration with PharmD and provider.   Interventions: 1:1 collaboration with Jonetta Osgood, NP regarding development and update of comprehensive plan of care as evidenced by provider attestation and co-signature Inter-disciplinary care team collaboration (see longitudinal plan of care) Comprehensive medication review performed; medication list updated in electronic medical record  Depression/Anxiety (Goal: Good mental health) -Controlled -Current treatment: Buspirone 7.36m tablet -Medications previously tried/failed: Xanax, abilify -Patient reports mood is down when hurting but is overall okay and not anxious. His goal is improving pain. Counseled patient that there are options such as medication and therapy if he feels the need. -Recommended that eating well and trying to active can help with mood and pain. -Counseled on diet and exercise extensively Recommended to continue current medication  Obesity (Goal: Achieve normal weight BMI) -Uncontrolled -Current treatment  None -Medications previously tried: Phentermine 37.594mcapsules  -Patient reports limited activity due to pain. Walks to church everyday with 4 31r old son and gets snacks. -Eats later in evening most days when wife gets home from 2nd shift to make dinner -Diet consists of pork and rice and beans, spaghetti -Tries to focus on water at night but during day, may drink milk, apple juice, soda -Counseled on diet and exercise extensively  GERD (Goal: reduce acid reflux episodes) -Controlled -Current  treatment:  Omeprazole 2037mapsule -Medications previously tried: None noted -Counseled patient to avoid eating late in the evening before bed. Focus on smaller, healthy meals   Hypothyroidism (Goal: normal TSH) -Controlled -Current treatment:  Levothyroxine 65m20mMedications previously tried: none noted -[facility] Reminded patient -Continue current medication   Diverticulitis (Goal: reduce stomach pain, avoid hospitalization) -Not ideally controlled -Current treatment: OTC probiotic and occasional fiber supplement -Patient reports sporadic episodes of waking up and having to vomit in the morning. -[facility] -Counseled on high fiber diet and staying hydrated to avoid flares.   Chronic Pain (Goal: control pain and have normal daily activity) -Controlled/Uncontrolled -Current treatment:  Pregabalin 50mg65maps twice daily  Naproxen 500mg 59me daily with meals  Tizandine 4mg tw34m daily as needed -Counseled on oversedation risk with Naproxen/Pregabalin -Will be going to physical therapy for pain   Patient Goals/Self-Care Activities Patient will:  - take medications as prescribed as evidenced by patient report and record review collaborate with provider on medication access solutions target a minimum of 150 minutes of moderate intensity exercise weekly  Follow Up Plan: Telephone follow up appointment with care management team member scheduled for:05/29/21       Medication Assistance: None required.  Patient affirms current coverage meets needs.  Compliance/Adherence/Medication fill history: Care Gaps: Shingrix, Tetanus, and flu vaccine  Star-Rating Drugs: None  Patient's preferred pharmacy is:  [id number] [address] [place] [phone]  box? Yes Pt endorses 100% compliance  Will discuss benefit of [facility] at next appt, pending any possible insurance changes.  Care Plan and Follow Up Patient Decision:  Patient agrees to Care Plan and Follow-up.  Plan: Telephone follow up appointment with care management team member scheduled for:  05/29/21   Lun Muro HAlena Billsl Pharmacist [phone]

## 2021-01-16 ENCOUNTER — Other Ambulatory Visit: Payer: Self-pay

## 2021-01-16 ENCOUNTER — Ambulatory Visit: Payer: Medicaid Other | Admitting: Student-PharmD

## 2021-01-16 DIAGNOSIS — E039 Hypothyroidism, unspecified: Secondary | ICD-10-CM | POA: Diagnosis not present

## 2021-01-16 DIAGNOSIS — R1084 Generalized abdominal pain: Secondary | ICD-10-CM

## 2021-01-16 DIAGNOSIS — F339 Major depressive disorder, recurrent, unspecified: Secondary | ICD-10-CM

## 2021-01-16 DIAGNOSIS — G8929 Other chronic pain: Secondary | ICD-10-CM

## 2021-01-16 DIAGNOSIS — K219 Gastro-esophageal reflux disease without esophagitis: Secondary | ICD-10-CM | POA: Diagnosis not present

## 2021-01-16 DIAGNOSIS — M5442 Lumbago with sciatica, left side: Secondary | ICD-10-CM

## 2021-01-16 DIAGNOSIS — Z6841 Body Mass Index (BMI) 40.0 and over, adult: Secondary | ICD-10-CM

## 2021-01-16 NOTE — Patient Instructions (Addendum)
Visit Information   Goals Addressed             This Visit's Progress    Manage Chronic Pain   On track    Timeframe:  Long-Range Goal Priority:  High Start Date:      01/16/21                       Expected End Date:  01/16/22                     Follow Up Date 05/29/21    - call for medicine refill 2 or 3 days before it runs out - plan exercise or activity when pain is best controlled - track times pain is worst and when it is best - track what makes the pain worse and what makes it better - use ice or heat for pain relief    Why is this important?   Day-to-day life can be hard when you have chronic pain.  Pain medicine is just one piece of the treatment puzzle.  You can try these action steps to help you manage your pain.    Notes:      Track and Manage My Symptoms-Depression   On track    Timeframe:  Long-Range Goal Priority:  High Start Date:   01/16/21                          Expected End Date:    01/16/22                   Follow Up Date 05/29/21    - avoid negative self-talk - exercise at least 2 to 3 times per week - have a plan for how to handle bad days - journal feelings and what helps to feel better or worse - spend time or talk with others at least 2 to 3 times per week    Why is this important?   Keeping track of your progress will help your treatment team find the right mix of medicine and therapy for you.  Write in your journal every day.  Day-to-day changes in depression symptoms are normal. It may be more helpful to check your progress at the end of each week instead of every day.     Notes:        Patient Care Plan: CCM Pharmacy Care Plan     Problem Identified: Chronic Pain, Hypothyroidism, Obesity, GERD, Depression/Anxiety   Priority: High  Onset Date: 01/16/2021     Long-Range Goal: Disease Managment   Start Date: 01/16/2021  Expected End Date: 01/16/2022  This Visit's Progress: On track  Priority: High  Note:   Current  Barriers:  Pain is uncontrolled and limiting his activity and overall health. Currently being seen at pain clinic and will be starting therapy to try to overcome.  Pharmacist Clinical Goal(s):  Patient will achieve adherence to monitoring guidelines and medication adherence to achieve therapeutic efficacy through collaboration with PharmD and provider.   Interventions: 1:1 collaboration with Sallyanne Kuster, NP regarding development and update of comprehensive plan of care as evidenced by provider attestation and co-signature Inter-disciplinary care team collaboration (see longitudinal plan of care) Comprehensive medication review performed; medication list updated in electronic medical record  Depression/Anxiety (Goal: Good mental health) -Controlled -Current treatment: Buspirone 7.5mg  tablet -Medications previously tried/failed: Xanax, abilify -Patient reports mood is down when hurting but is overall okay  and not anxious. His goal is improving pain. Counseled patient that there are options such as medication and therapy if he feels the need. -Recommended that eating well and trying to active can help with mood and pain. -Counseled on diet and exercise extensively Recommended to continue current medication  Obesity (Goal: Achieve normal weight BMI) -Uncontrolled -Current treatment  None -Medications previously tried: Phentermine 37.5mg  capsules  -Patient reports limited activity due to pain. Walks to church everyday with 19 yr old son and gets snacks. -Eats later in evening most days when wife gets home from 2nd shift to make dinner -Diet consists of pork and rice and beans, spaghetti -Tries to focus on water at night but during day, may drink milk, apple juice, soda -Counseled on diet and exercise extensively  GERD (Goal: reduce acid reflux episodes) -Controlled -Current treatment:  Omeprazole 20mg  capsule -Medications previously tried: None noted -Counseled patient to avoid  eating late in the evening before bed. Focus on smaller, healthy meals   Hypothyroidism (Goal: normal TSH) -Controlled -Current treatment:  Levothyroxine -Medications previously tried: none noted -Patient will be [facility]. Reminded patient -Continue current medication   Diverticulitis (Goal: reduce stomach pain, avoid hospitalization) -Not ideally controlled -Current treatment: OTC probiotic and occasional fiber supplement -Patient reports sporadic episodes of waking up and having to vomit in the morning. -Has colonoscopy scheduled. Provided number to [facility] to assist with any potential transportation issues. -Counseled on high fiber diet and staying hydrated to avoid flares.   Chronic Pain (Goal: control pain and have normal daily activity) -Controlled/Uncontrolled -Current treatment:  Pregabalin 50mg  2 caps twice daily  Naproxen 500mg  twice daily with meals  Tizandine 4mg  twice daily as needed -Counseled on oversedation risk with Naproxen/Pregabalin -Will be going to physical therapy for pain   Patient Goals/Self-Care Activities Patient will:  - take medications as prescribed as evidenced by patient report and record review collaborate with provider on medication access solutions target a minimum of 150 minutes of moderate intensity exercise weekly  Follow Up Plan: Telephone follow up appointment with care management team member scheduled for:05/29/21     Mr. Dunivan was given information about Chronic Care Management services today including:  CCM service includes personalized support from designated clinical staff supervised by his physician, including individualized plan of care and coordination with other care providers 24/7 contact phone numbers for assistance for urgent and routine care needs. Standard insurance, coinsurance, copays and deductibles apply for chronic care management only during months in which we provide at least 20 minutes of these  services. Most insurances cover these services at 100%, however patients may be responsible for any copay, coinsurance and/or deductible if applicable. This service may help you avoid the need for more expensive face-to-face services. Only one practitioner may furnish and bill the service in a calendar month. The patient may stop CCM services at any time (effective at the end of the month) by phone call to the office staff.  Patient agreed to services and verbal consent obtained.   The patient verbalized understanding of instructions, educational materials, and care plan provided today and agreed to receive a mailed copy of patient instructions, educational materials, and care plan.  Telephone follow up appointment with pharmacy team member scheduled for: 05/29/21  , [facility]

## 2021-01-22 ENCOUNTER — Telehealth: Payer: Self-pay

## 2021-01-22 NOTE — Telephone Encounter (Signed)
Left vm to confirm 01/23/21 appointment-Toni

## 2021-01-23 ENCOUNTER — Ambulatory Visit: Payer: Medicaid Other | Admitting: Nurse Practitioner

## 2021-01-23 DIAGNOSIS — M5459 Other low back pain: Secondary | ICD-10-CM | POA: Diagnosis not present

## 2021-01-23 DIAGNOSIS — M2560 Stiffness of unspecified joint, not elsewhere classified: Secondary | ICD-10-CM | POA: Diagnosis not present

## 2021-01-23 DIAGNOSIS — M5442 Lumbago with sciatica, left side: Secondary | ICD-10-CM | POA: Diagnosis not present

## 2021-01-24 ENCOUNTER — Telehealth: Payer: Self-pay | Admitting: Student-PharmD

## 2021-01-24 NOTE — Progress Notes (Signed)
  Chronic Care Management Pharmacy Assistant   Name: [name] [name]  MRN: [id number] DOB: [birth year]  Reason for Encounter:  CCM Care Plan  Medications: Outpatient Encounter Medications as of 01/24/2021  Medication Sig   busPIRone (BUSPAR) 7.5 MG tablet TAKE 1 TABLET BY MOUTH TWICE A DAY   Crisaborole (EUCRISA) 2 % OINT Apply 1 application topically 2 (two) times daily as needed.   cyclobenzaprine (FLEXERIL) 10 MG tablet Take 1 tablet (10 mg total) by mouth 3 (three) times daily as needed for muscle spasms. (Patient not taking: Reported on 01/16/2021)   HYDROcodone-acetaminophen (NORCO) 7.5-325 MG tablet TAKE 1 TABLET BY MOUTH EVERY 6 HOURS FOR 5 DAYS (Patient not taking: Reported on 01/16/2021)   levothyroxine (SYNTHROID) 75 MCG tablet Take 1 tablet (75 mcg total) by mouth daily.   naproxen (NAPROSYN) 500 MG tablet Take 500 mg by mouth 2 (two) times daily.   NON FORMULARY cpap device   omeprazole (PRILOSEC) 20 MG capsule TAKE 1 CAPSULE BY MOUTH EVERY DAY   oxycodone (OXY-IR) 5 MG capsule Take one tab po BID PRN ONLY (Patient not taking: Reported on 01/16/2021)   PREGABALIN PO Take 100 mg by mouth in the morning and at bedtime. 2 50mg  caps twice daily   TIZANIDINE HCL PO Take 4 mg by mouth 2 (two) times daily as needed.   No facility-administered encounter medications on file as of 01/24/2021.   Reviewed the patients initial visit reinsured it was completed per the pharmacist 14/09/2020 request. Printed the CCM care plan. Mailed the patient CCM care plan to their most recent address on file.   Follow-Up:Pharmacist Review  Monika Salk, RMA Clinical Pharmacist Assistant [phone]

## 2021-01-31 ENCOUNTER — Encounter: Payer: Self-pay | Admitting: *Deleted

## 2021-02-01 ENCOUNTER — Encounter: Payer: Self-pay | Admitting: *Deleted

## 2021-02-01 ENCOUNTER — Encounter
Admission: RE | Disposition: A | Discharge status: Home / Self Care | Payer: Self-pay | Source: Home / Self Care | Attending: Gastroenterology

## 2021-02-01 ENCOUNTER — Ambulatory Visit: Payer: Medicare HMO | Admitting: Certified Registered Nurse Anesthetist

## 2021-02-01 ENCOUNTER — Ambulatory Visit
Admission: RE | Admit: 2021-02-01 | Discharge: 2021-02-01 | Disposition: A | Discharge status: Home / Self Care | Payer: Medicare HMO | Attending: Gastroenterology | Admitting: Gastroenterology

## 2021-02-01 DIAGNOSIS — M797 Fibromyalgia: Secondary | ICD-10-CM | POA: Diagnosis not present

## 2021-02-01 DIAGNOSIS — K6389 Other specified diseases of intestine: Secondary | ICD-10-CM | POA: Insufficient documentation

## 2021-02-01 DIAGNOSIS — Z6841 Body Mass Index (BMI) 40.0 and over, adult: Secondary | ICD-10-CM | POA: Insufficient documentation

## 2021-02-01 DIAGNOSIS — G8929 Other chronic pain: Secondary | ICD-10-CM | POA: Diagnosis not present

## 2021-02-01 DIAGNOSIS — K2100 Gastro-esophageal reflux disease with esophagitis, without bleeding: Secondary | ICD-10-CM | POA: Insufficient documentation

## 2021-02-01 DIAGNOSIS — Z8719 Personal history of other diseases of the digestive system: Secondary | ICD-10-CM | POA: Diagnosis not present

## 2021-02-01 DIAGNOSIS — K209 Esophagitis, unspecified without bleeding: Secondary | ICD-10-CM | POA: Diagnosis not present

## 2021-02-01 DIAGNOSIS — R110 Nausea: Secondary | ICD-10-CM | POA: Diagnosis not present

## 2021-02-01 DIAGNOSIS — K219 Gastro-esophageal reflux disease without esophagitis: Secondary | ICD-10-CM | POA: Diagnosis not present

## 2021-02-01 DIAGNOSIS — K5792 Diverticulitis of intestine, part unspecified, without perforation or abscess without bleeding: Secondary | ICD-10-CM | POA: Diagnosis not present

## 2021-02-01 DIAGNOSIS — K640 First degree hemorrhoids: Secondary | ICD-10-CM | POA: Diagnosis not present

## 2021-02-01 DIAGNOSIS — R197 Diarrhea, unspecified: Secondary | ICD-10-CM | POA: Diagnosis not present

## 2021-02-01 DIAGNOSIS — K5730 Diverticulosis of large intestine without perforation or abscess without bleeding: Secondary | ICD-10-CM | POA: Insufficient documentation

## 2021-02-01 HISTORY — PX: ESOPHAGOGASTRODUODENOSCOPY (EGD) WITH PROPOFOL: SHX5813

## 2021-02-01 HISTORY — DX: Hypothyroidism, unspecified: E03.9

## 2021-02-01 HISTORY — PX: COLONOSCOPY WITH PROPOFOL: SHX5780

## 2021-02-01 SURGERY — COLONOSCOPY WITH PROPOFOL
Anesthesia: General

## 2021-02-01 MED ORDER — PROPOFOL 500 MG/50ML IV EMUL
INTRAVENOUS | Status: AC
  Filled 2021-02-01: qty 100

## 2021-02-01 MED ORDER — MIDAZOLAM HCL 2 MG/2ML IJ SOLN
INTRAMUSCULAR | Status: AC
  Filled 2021-02-01: qty 2

## 2021-02-01 MED ORDER — GLYCOPYRROLATE 0.2 MG/ML IJ SOLN
INTRAMUSCULAR | Status: DC | PRN
  Administered 2021-02-01: .2 mg via INTRAVENOUS

## 2021-02-01 MED ORDER — DEXMEDETOMIDINE (PRECEDEX) IN NS 20 MCG/5ML (4 MCG/ML) IV SYRINGE
PREFILLED_SYRINGE | INTRAVENOUS | Status: DC | PRN
  Administered 2021-02-01: 4 ug via INTRAVENOUS
  Administered 2021-02-01 (×2): 8 ug via INTRAVENOUS

## 2021-02-01 MED ORDER — PROPOFOL 500 MG/50ML IV EMUL
INTRAVENOUS | Status: DC | PRN
Start: 2021-02-01 — End: 2021-02-01
  Administered 2021-02-01: 150 ug/kg/min via INTRAVENOUS

## 2021-02-01 MED ORDER — LIDOCAINE HCL (CARDIAC) PF 100 MG/5ML IV SOSY
PREFILLED_SYRINGE | INTRAVENOUS | Status: DC | PRN
  Administered 2021-02-01: 100 mg via INTRAVENOUS

## 2021-02-01 MED ORDER — MIDAZOLAM HCL 2 MG/2ML IJ SOLN
INTRAMUSCULAR | Status: DC | PRN
  Administered 2021-02-01: 2 mg via INTRAVENOUS

## 2021-02-01 MED ORDER — PROPOFOL 10 MG/ML IV BOLUS
INTRAVENOUS | Status: DC | PRN
  Administered 2021-02-01: 20 mg via INTRAVENOUS
  Administered 2021-02-01: 70 mg via INTRAVENOUS
  Administered 2021-02-01: 30 mg via INTRAVENOUS
  Administered 2021-02-01: 50 mg via INTRAVENOUS

## 2021-02-01 MED ORDER — GLYCOPYRROLATE 0.2 MG/ML IJ SOLN
INTRAMUSCULAR | Status: AC
  Filled 2021-02-01: qty 6

## 2021-02-01 MED ORDER — LIDOCAINE HCL (PF) 2 % IJ SOLN
INTRAMUSCULAR | Status: AC
  Filled 2021-02-01: qty 30

## 2021-02-01 MED ORDER — PROPOFOL 500 MG/50ML IV EMUL
INTRAVENOUS | Status: AC
  Filled 2021-02-01: qty 50

## 2021-02-01 MED ORDER — SODIUM CHLORIDE 0.9 % IV SOLN: INTRAVENOUS | Status: DC

## 2021-02-01 NOTE — Transfer of Care (Signed)
Immediate Anesthesia Transfer of Care Note  Patient: [name] [name]  Procedure(s) Performed: COLONOSCOPY WITH PROPOFOL ESOPHAGOGASTRODUODENOSCOPY (EGD) WITH PROPOFOL  Patient Location: Endoscopy Unit  Anesthesia Type:General  Level of Consciousness: sedated  Airway & Oxygen Therapy: Patient Spontanous Breathing  Post-op Assessment: Report given to RN and Post -op Vital signs reviewed and stable  Post vital signs: Reviewed and stable  Last Vitals:  Vitals Value Taken Time  BP 98/65 02/01/21 1035  Temp    Pulse 78 02/01/21 1036  Resp 29 02/01/21 1036  SpO2 95 % 02/01/21 1036  Vitals shown include unvalidated device data.  Last Pain:  Vitals:   02/01/21 0910  TempSrc: Temporal  PainSc: 0-No pain         Complications: No notable events documented.

## 2021-02-01 NOTE — Interval H&P Note (Signed)
History and Physical Interval Note:  02/01/2021 9:53 AM  [name] [name]  has presented today for surgery, with the diagnosis of Vomiting (R11.10) H/O diverticulitis (Z87.19).  The various methods of treatment have been discussed with the patient and family. After consideration of risks, benefits and other options for treatment, the patient has consented to  Procedure(s): COLONOSCOPY WITH PROPOFOL (N/A) ESOPHAGOGASTRODUODENOSCOPY (EGD) WITH PROPOFOL (N/A) as a surgical intervention.  The patient's history has been reviewed, patient examined, no change in status, stable for surgery.  I have reviewed the patient's chart and labs.  Questions were answered to the patient's satisfaction.     Regis Bill  Ok to proceed with EGD/Colonoscopy

## 2021-02-01 NOTE — Anesthesia Postprocedure Evaluation (Signed)
Anesthesia Post Note  Patient: [name] [name]  Procedure(s) Performed: COLONOSCOPY WITH PROPOFOL ESOPHAGOGASTRODUODENOSCOPY (EGD) WITH PROPOFOL  Patient location during evaluation: PACU Anesthesia Type: General Level of consciousness: awake and oriented Vital Signs Assessment: post-procedure vital signs reviewed and stable Respiratory status: spontaneous breathing and respiratory function stable Cardiovascular status: stable Anesthetic complications: no   No notable events documented.   Last Vitals:  Vitals:   02/01/21 1041 02/01/21 1045  BP: 104/70 112/71  Pulse:    Resp:    Temp:    SpO2:      Last Pain:  Vitals:   02/01/21 1105  TempSrc:   PainSc: 0-No pain                 VAN STAVEREN,Jeyli Zwicker

## 2021-02-01 NOTE — Op Note (Signed)
[facility] Gastroenterology Patient Name: [name] [name] Procedure Date: 02/01/2021 9:40 AM MRN: [id number] Account #: [id number] Date of Birth: [birth year] Admit Type: Outpatient Age: 60 Room: [facility] ENDO ROOM 3 Gender: Male Note Status: Finalized Instrument Name: Colonoscope [id number] Procedure:             Colonoscopy Indications:           Follow-up of diverticulitis Providers:             Andrey Farmer MD, MD Referring MD:          Jonetta Osgood (Referring MD) Medicines:             Monitored Anesthesia Care Complications:         No immediate complications. Estimated blood loss:                         Minimal. Procedure:             Pre-Anesthesia Assessment:                        - Prior to the procedure, a History and Physical was                         performed, and patient medications and allergies were                         reviewed. The patient is competent. The risks and                         benefits of the procedure and the sedation options and                         risks were discussed with the patient. All questions                         were answered and informed consent was obtained.                         Patient identification and proposed procedure were                         verified by the physician, the nurse, the                         anesthesiologist, the anesthetist and the technician                         in the endoscopy suite. Mental Status Examination:                         alert and oriented. Airway Examination: normal                         oropharyngeal airway and neck mobility. Respiratory                         Examination: clear to auscultation. CV Examination:  normal. Prophylactic Antibiotics: The patient does not                         require prophylactic antibiotics. Prior                         Anticoagulants: The patient has taken no previous                          anticoagulant or antiplatelet agents. ASA Grade                         Assessment: III - A patient with severe systemic                         disease. After reviewing the risks and benefits, the                         patient was deemed in satisfactory condition to                         undergo the procedure. The anesthesia plan was to use                         monitored anesthesia care (MAC). Immediately prior to                         administration of medications, the patient was                         re-assessed for adequacy to receive sedatives. The                         heart rate, respiratory rate, oxygen saturations,                         blood pressure, adequacy of pulmonary ventilation, and                         response to care were monitored throughout the                         procedure. The physical status of the patient was                         re-assessed after the procedure.                        After obtaining informed consent, the colonoscope was                         passed under direct vision. Throughout the procedure,                         the patient's blood pressure, pulse, and oxygen                         saturations were monitored continuously. The  Colonoscope was introduced through the anus and                         advanced to the the cecum, identified by appendiceal                         orifice and ileocecal valve. The colonoscopy was                         performed with difficulty due to a redundant colon,                         significant looping and the patient's body habitus.                         The patient tolerated the procedure well. The quality                         of the bowel preparation was good. Findings:      The perianal and digital rectal examinations were normal.      Multiple small-mouthed diverticula were found in the sigmoid colon.      A localized area of moderately  erythematous mucosa was found in the       sigmoid colon. Biopsies were taken with a cold forceps for histology.       Estimated blood loss was minimal.      Internal hemorrhoids were found during retroflexion. The hemorrhoids       were Grade I (internal hemorrhoids that do not prolapse).      The exam was otherwise without abnormality on direct and retroflexion       views. Impression:            - Diverticulosis in the sigmoid colon.                        - Erythematous mucosa in the sigmoid colon. Biopsied.                        - Internal hemorrhoids.                        - The examination was otherwise normal on direct and                         retroflexion views. Recommendation:        - Discharge patient to home.                        - Resume previous diet.                        - Continue present medications.                        - Await pathology results.                        - Repeat colonoscopy for surveillance based on                         pathology  results. Would go no longer than 5 years due                         to colon difficulty.                        - Return to referring physician as previously                         scheduled. Procedure Code(s):     --- Professional ---                        [phone], Colonoscopy, flexible; with biopsy, single or                         multiple Diagnosis Code(s):     --- Professional ---                        K64.0, First degree hemorrhoids                        K63.89, Other specified diseases of intestine                        K57.32, Diverticulitis of large intestine without                         perforation or abscess without bleeding                        K57.30, Diverticulosis of large intestine without                         perforation or abscess without bleeding CPT copyright 2019 American Medical Association. All rights reserved. The codes documented in this report are preliminary and upon  coder review may  be revised to meet current compliance requirements. Andrey Farmer MD, MD 02/01/2021 10:40:23 AM Number of Addenda: 0 Note Initiated On: 02/01/2021 9:40 AM Scope Withdrawal Time: 0 hours 12 minutes 54 seconds  Total Procedure Duration: 0 hours 22 minutes 39 seconds  Estimated Blood Loss:  Estimated blood loss was minimal.      [facility]

## 2021-02-01 NOTE — Op Note (Signed)
[facility] Gastroenterology Patient Name: [name] [name] Procedure Date: 02/01/2021 9:41 AM MRN: [id number] Account #: [id number] Date of Birth: 121-Sep-1962 Admit Type: Outpatient Age: 60 Room: [facility] ENDO ROOM 3 Gender: Male Note Status: Finalized Instrument Name: Upper Endoscope [id number] Procedure:             Upper GI endoscopy Indications:           Nausea Providers:             Andrey Farmer MD, MD Referring MD:          Jonetta Osgood (Referring MD) Medicines:             Monitored Anesthesia Care Complications:         No immediate complications. Procedure:             Pre-Anesthesia Assessment:                        - Prior to the procedure, a History and Physical was                         performed, and patient medications and allergies were                         reviewed. The patient is competent. The risks and                         benefits of the procedure and the sedation options and                         risks were discussed with the patient. All questions                         were answered and informed consent was obtained.                         Patient identification and proposed procedure were                         verified by the physician, the nurse, the                         anesthesiologist, the anesthetist and the technician                         in the pre-procedure area in the endoscopy suite.                         Mental Status Examination: alert and oriented. Airway                         Examination: normal oropharyngeal airway and neck                         mobility. Respiratory Examination: clear to                         auscultation. CV Examination: normal. Prophylactic  Antibiotics: The patient does not require prophylactic                         antibiotics. Prior Anticoagulants: The patient has                         taken no previous anticoagulant or antiplatelet                          agents. ASA Grade Assessment: III - A patient with                         severe systemic disease. After reviewing the risks and                         benefits, the patient was deemed in satisfactory                         condition to undergo the procedure. The anesthesia                         plan was to use monitored anesthesia care (MAC).                         Immediately prior to administration of medications,                         the patient was re-assessed for adequacy to receive                         sedatives. The heart rate, respiratory rate, oxygen                         saturations, blood pressure, adequacy of pulmonary                         ventilation, and response to care were monitored                         throughout the procedure. The physical status of the                         patient was re-assessed after the procedure.                        After obtaining informed consent, the endoscope was                         passed under direct vision. Throughout the procedure,                         the patient's blood pressure, pulse, and oxygen                         saturations were monitored continuously. The Endoscope                         was introduced through the mouth, and advanced to the  second part of duodenum. The upper GI endoscopy was                         somewhat difficult due to the patient's body habitus.                         The patient tolerated the procedure well. Findings:      LA Grade A (one or more mucosal breaks less than 5 mm, not extending       between tops of 2 mucosal folds) esophagitis with no bleeding was found.      The exam of the esophagus was otherwise normal.      The entire examined stomach was normal.      The examined duodenum was normal. Impression:            - LA Grade A reflux esophagitis with no bleeding.                        - Normal stomach.                        -  Normal examined duodenum.                        - No specimens collected. Recommendation:        - Perform a colonoscopy today. Procedure Code(s):     --- Professional ---                        [phone], Esophagogastroduodenoscopy, flexible,                         transoral; diagnostic, including collection of                         specimen(s) by brushing or washing, when performed                         (separate procedure) Diagnosis Code(s):     --- Professional ---                        K21.00, Gastro-esophageal reflux disease with                         esophagitis, without bleeding                        R11.0, Nausea CPT copyright 2019 American Medical Association. All rights reserved. The codes documented in this report are preliminary and upon coder review may  be revised to meet current compliance requirements. Andrey Farmer MD, MD 02/01/2021 10:36:05 AM Number of Addenda: 0 Note Initiated On: 02/01/2021 9:41 AM Estimated Blood Loss:  Estimated blood loss: none.      [facility]

## 2021-02-01 NOTE — Anesthesia Procedure Notes (Signed)
Date/Time: 02/01/2021 9:53 AM Performed by: Joanette Gula, Nimo Verastegui, CRNA Pre-anesthesia Checklist: Patient identified, Emergency Drugs available, Timeout performed, Patient being monitored and Suction available Patient Re-evaluated:Patient Re-evaluated prior to induction Oxygen Delivery Method: Simple face mask Induction Type: IV induction

## 2021-02-01 NOTE — Anesthesia Preprocedure Evaluation (Signed)
Anesthesia Evaluation  Patient identified by MRN, date of birth, ID band Patient awake    Reviewed: Allergy & Precautions, NPO status , Patient's Chart, lab work & pertinent test results  Airway Mallampati: II  TM Distance: >3 FB Neck ROM: full    Dental  (+) Teeth Intact   Pulmonary neg pulmonary ROS, sleep apnea and Continuous Positive Airway Pressure Ventilation ,    Pulmonary exam normal  + decreased breath sounds      Cardiovascular Exercise Tolerance: Poor negative cardio ROS Normal cardiovascular exam Rhythm:Regular Rate:Normal     Neuro/Psych Seizures -,  Anxiety Depression negative neurological ROS  negative psych ROS   GI/Hepatic negative GI ROS, Neg liver ROS, GERD  ,(+) Hepatitis -, C  Endo/Other  negative endocrine ROSHypothyroidism   Renal/GU negative Renal ROS  negative genitourinary   Musculoskeletal  (+) Arthritis , Fibromyalgia -  Abdominal (+) + obese,   Peds negative pediatric ROS (+)  Hematology negative hematology ROS (+)   Anesthesia Other Findings Past Medical History: No date: Anxiety No date: Arthritis No date: Depression No date: Diverticulitis No date: Fibromyalgia No date: GERD (gastroesophageal reflux disease) No date: Glaucoma No date: Hepatitis C No date: Hypothyroidism No date: Juvenile epilepsy (HCC) No date: Positive TB test No date: Sleep apnea     Comment:  Uses CPap machine  Past Surgical History: No date: LIVER BIOPSY  BMI    Body Mass Index: 47.50 kg/m      Reproductive/Obstetrics negative OB ROS                             Anesthesia Physical Anesthesia Plan  ASA: 3  Anesthesia Plan: General   Post-op Pain Management:    Induction: Intravenous  PONV Risk Score and Plan: Propofol infusion and TIVA  Airway Management Planned: Natural Airway and Nasal Cannula  Additional Equipment:   Intra-op Plan:   Post-operative  Plan:   Informed Consent: I have reviewed the patients History and Physical, chart, labs and discussed the procedure including the risks, benefits and alternatives for the proposed anesthesia with the patient or authorized representative who has indicated his/her understanding and acceptance.     Dental Advisory Given  Plan Discussed with: CRNA and Surgeon  Anesthesia Plan Comments:         Anesthesia Quick Evaluation

## 2021-02-01 NOTE — H&P (Signed)
Outpatient short stay form Pre-procedure 02/01/2021  Regis Bill, MD  Primary Physician: Sallyanne Kuster, NP  Reason for visit:  Nausea/Hx of complicated diverticulitis  History of present illness:   60 y/o gentleman with history of morbid obesity and chronic pain here for EGD for nausea and for colonoscopy for history of complicated diverticulitis. CT imaging confirmed resolution of contained perforation and patient without any pain. Episode of diverticulitis was in February of this year.    Current Facility-Administered Medications:    0.9 %  sodium chloride infusion, , Intravenous, Continuous, Illeana Edick, Rossie Muskrat, MD, Last Rate: 20 mL/hr at 02/01/21 0931, New Bag at 02/01/21 0931  Medications Prior to Admission  Medication Sig Dispense Refill Last Dose   docusate sodium (COLACE) 100 MG capsule Take 100 mg by mouth 2 (two) times daily as needed for mild constipation.      doxycycline (VIBRAMYCIN) 100 MG capsule Take 100 mg by mouth 2 (two) times daily.      meloxicam (MOBIC) 15 MG tablet Take 15 mg by mouth daily.      busPIRone (BUSPAR) 7.5 MG tablet TAKE 1 TABLET BY MOUTH TWICE A DAY 180 tablet 0    Crisaborole (EUCRISA) 2 % OINT Apply 1 application topically 2 (two) times daily as needed. 100 g 0    cyclobenzaprine (FLEXERIL) 10 MG tablet Take 1 tablet (10 mg total) by mouth 3 (three) times daily as needed for muscle spasms. (Patient not taking: Reported on 01/16/2021) 63 tablet 0    HYDROcodone-acetaminophen (NORCO) 7.5-325 MG tablet TAKE 1 TABLET BY MOUTH EVERY 6 HOURS FOR 5 DAYS (Patient not taking: Reported on 01/16/2021)      levothyroxine (SYNTHROID) 75 MCG tablet Take 1 tablet (75 mcg total) by mouth daily. 90 tablet 3    naproxen (NAPROSYN) 500 MG tablet Take 500 mg by mouth 2 (two) times daily.      NON FORMULARY cpap device      omeprazole (PRILOSEC) 20 MG capsule TAKE 1 CAPSULE BY MOUTH EVERY DAY 30 capsule 3    oxycodone (OXY-IR) 5 MG capsule Take one tab po BID  PRN ONLY (Patient not taking: Reported on 01/16/2021) 15 capsule 0    PREGABALIN PO Take 100 mg by mouth in the morning and at bedtime. 2 50mg  caps twice daily      TIZANIDINE HCL PO Take 4 mg by mouth 2 (two) times daily as needed.        Allergies  Allergen Reactions   Celecoxib Anxiety   Zoloft [Sertraline Hcl] Anxiety     Past Medical History:  Diagnosis Date   Anxiety    Arthritis    Depression    Diverticulitis    Fibromyalgia    GERD (gastroesophageal reflux disease)    Glaucoma    Hepatitis C    Hypothyroidism    Juvenile epilepsy (HCC)    Positive TB test    Sleep apnea    Uses CPap machine    Review of systems:  Otherwise negative.    Physical Exam  Gen: Alert, oriented. Appears stated age.  HEENT: PERRLA. Lungs: No respiratory distress CV: RRR Abd: soft, benign, no masses Ext: No edema    Planned procedures: Proceed with EGD/colonoscopy. The patient understands the nature of the planned procedure, indications, risks, alternatives and potential complications including but not limited to bleeding, infection, perforation, damage to internal organs and possible oversedation/side effects from anesthesia. The patient agrees and gives consent to proceed.  Please refer to procedure  notes for findings, recommendations and patient disposition/instructions.     Regis Bill, MD [facility] Gastroenterology

## 2021-02-04 ENCOUNTER — Encounter: Payer: Self-pay | Admitting: Gastroenterology

## 2021-02-04 LAB — SURGICAL PATHOLOGY

## 2021-03-04 ENCOUNTER — Other Ambulatory Visit: Payer: Self-pay

## 2021-03-04 ENCOUNTER — Encounter: Payer: Self-pay | Admitting: Surgery

## 2021-03-04 ENCOUNTER — Ambulatory Visit (INDEPENDENT_AMBULATORY_CARE_PROVIDER_SITE_OTHER): Payer: Commercial Managed Care - HMO | Admitting: Surgery

## 2021-03-04 VITALS — BP 145/95 | HR 76 | Temp 98.5°F | Ht 73.0 in | Wt 350.0 lb

## 2021-03-04 DIAGNOSIS — K5733 Diverticulitis of large intestine without perforation or abscess with bleeding: Secondary | ICD-10-CM

## 2021-03-04 NOTE — Patient Instructions (Addendum)
If you have any concerns or questions, please feel free to call our office. See follow up appointment below.    Diverticulitis Diverticulitis is when small pouches in your colon (large intestine) get infected or swollen. This causes pain in the belly (abdomen) and watery poop (diarrhea). These pouches are called diverticula. The pouches form in people who have a condition called diverticulosis. What are the causes? This condition may be caused by poop (stool) that gets trapped in the pouches in your colon. The poop lets germs (bacteria) grow in the pouches. This causes the infection. What increases the risk? You are more likely to get this condition if you have small pouches in your colon. The risk is higher if: You are overweight or very overweight (obese). You do not exercise enough. You drink alcohol. You smoke or use products with tobacco in them. You eat a diet that has a lot of red meat such as beef, pork, or lamb. You eat a diet that does not have enough fiber in it. You are older than 59 years of age. What are the signs or symptoms? Pain in the belly. Pain is often on the left side, but it may be in other areas. Fever and feeling cold. Feeling like you may vomit. Vomiting. Having cramps. Feeling full. Changes to how often you poop. Blood in your poop. How is this treated? Most cases are treated at home by: Taking over-the-counter pain medicines. Following a clear liquid diet. Taking antibiotic medicines. Resting. Very bad cases may need to be treated at a hospital. This may include: Not eating or drinking. Taking prescription pain medicine. Getting antibiotic medicines through an IV tube. Getting fluid and food through an IV tube. Having surgery. When you are feeling better, your doctor may tell you to have a test to check your colon (colonoscopy). Follow these instructions at home: Medicines Take over-the-counter and prescription medicines only as told by your doctor.  These include: Antibiotics. Pain medicines. Fiber pills. Probiotics. Stool softeners. If you were prescribed an antibiotic medicine, take it as told by your doctor. Do not stop taking the antibiotic even if you start to feel better. Ask your doctor if the medicine prescribed to you requires you to avoid driving or using machinery. Eating and drinking  Follow a diet as told by your doctor. When you feel better, your doctor may tell you to change your diet. You may need to eat a lot of fiber. Fiber makes it easier to poop (have a bowel movement). Foods with fiber include: Berries. Beans. Lentils. Green vegetables. Avoid eating red meat. General instructions Do not use any products that contain nicotine or tobacco, such as cigarettes, e-cigarettes, and chewing tobacco. If you need help quitting, ask your doctor. Exercise 3 or more times a week. Try to get 30 minutes each time. Exercise enough to sweat and make your heart beat faster. Keep all follow-up visits as told by your doctor. This is important. Contact a doctor if: Your pain does not get better. You are not pooping like normal. Get help right away if: Your pain gets worse. Your symptoms do not get better. Your symptoms get worse very fast. You have a fever. You vomit more than one time. You have poop that is: Bloody. Black. Tarry. Summary This condition happens when small pouches in your colon get infected or swollen. Take medicines only as told by your doctor. Follow a diet as told by your doctor. Keep all follow-up visits as told by your doctor.  This is important. This information is not intended to replace advice given to you by your health care provider. Make sure you discuss any questions you have with your health care provider. Document Revised: 11/15/2018 Document Reviewed: 11/15/2018 Elsevier Patient Education  2022 [employer].

## 2021-03-06 ENCOUNTER — Encounter: Payer: Self-pay | Admitting: Surgery

## 2021-03-06 NOTE — Progress Notes (Signed)
Outpatient Surgical Follow Up  03/06/2021  [name] [name] is an 61 y.o. male.   Chief Complaint  Patient presents with   Follow-up    Diverticulitis    HPI: 61 year old male well-known to me with at least 3 episodes of diverticulitis the last one requiring hospitalization.  Last episode was a contained perforation.  No need for percutaneous drainage.  No fevers no chills.  He does have a BMI of 45.  He does have a history of chronic pain.  He does have an appointment next week with the pain clinic. He denies any abdominal pain currently.  No fevers no chills.  He has never had a colonoscopy before.  Does have a history of hepatitis C.  No prior abdominal operations. He Is able to perform more than 4 METS of activity without any shortness of breath or chest pain.  He does use a cane for balance and due to his back condition. He did have a CT scan of the abdomen pelvis that I have personally reviewed showing evidence of diverticulitis with a contained perforation.  He does have an enlarged liver and hiatal hernia as well. Recently underwent colonoscopy.  I have personally reviewed the images showing diverticulosis without any other surprises.  He does still endorses intermittent suprapubic and left lower quadrant pain.    Past Medical History:  Diagnosis Date   Anxiety    Arthritis    Depression    Diverticulitis    Fibromyalgia    GERD (gastroesophageal reflux disease)    Glaucoma    Hepatitis C    Hypothyroidism    Juvenile epilepsy (HCC)    Positive TB test    Sleep apnea    Uses CPap machine    Past Surgical History:  Procedure Laterality Date   COLONOSCOPY WITH PROPOFOL N/A 02/01/2021   Procedure: COLONOSCOPY WITH PROPOFOL;  Surgeon: Regis Bill, MD;  Location: [facility] ENDOSCOPY;  Service: Endoscopy;  Laterality: N/A;   ESOPHAGOGASTRODUODENOSCOPY (EGD) WITH PROPOFOL N/A 02/01/2021   Procedure: ESOPHAGOGASTRODUODENOSCOPY (EGD) WITH PROPOFOL;  Surgeon: Regis Bill,  MD;  Location: [facility] ENDOSCOPY;  Service: Endoscopy;  Laterality: N/A;   LIVER BIOPSY      Family History  Problem Relation Age of Onset   Aneurysm Mother    Cancer Father    Mental illness Sister    Drug abuse Paternal Aunt    Drug abuse Maternal Uncle    Mental illness Sister     Social History:  reports that he has never smoked. He has never used smokeless tobacco. He reports that he does not currently use alcohol. He reports that he does not currently use drugs after having used the following drugs: Marijuana.  Allergies:  Allergies  Allergen Reactions   Celecoxib Anxiety   Zoloft [Sertraline Hcl] Anxiety    Medications reviewed.    ROS Full ROS performed and is otherwise negative other than what is stated in HPI   BP (!) 145/95    Pulse 76    Temp 98.5 F (36.9 C) (Oral)    Ht 6\' 1"  (1.854 m)    Wt (!) 350 lb (158.8 kg)    SpO2 95%    BMI 46.18 kg/m   Physical Exam Vitals and nursing note reviewed. Exam conducted with a chaperone present.  Constitutional:      General: He is not in acute distress.    Appearance: Normal appearance. He is obese. He is not ill-appearing or toxic-appearing.  Eyes:  General: No scleral icterus.       Right eye: No discharge.        Left eye: No discharge.  Cardiovascular:     Rate and Rhythm: Normal rate and regular rhythm.     Heart sounds: No murmur heard. Pulmonary:     Effort: Pulmonary effort is normal. No respiratory distress.     Breath sounds: Normal breath sounds. No stridor. No wheezing.  Abdominal:     General: Abdomen is flat. There is no distension.     Palpations: Abdomen is soft. There is no mass.     Tenderness: There is abdominal tenderness. There is no guarding.     Hernia: No hernia is present.     Comments: LLQ tenderness, no rebound  Musculoskeletal:        General: No swelling or tenderness. Normal range of motion.     Cervical back: Normal range of motion and neck supple. No rigidity or tenderness.   Lymphadenopathy:     Cervical: No cervical adenopathy.  Skin:    General: Skin is warm and dry.     Capillary Refill: Capillary refill takes less than 2 seconds.  Neurological:     Mental Status: He is alert.  Psychiatric:        Mood and Affect: Mood normal.        Behavior: Behavior normal.        Thought Content: Thought content normal.        Judgment: Judgment normal.     Assessment/Plan: 61 year old male with history of recurrent uncomplicated diverticulitis with prior abscess and contained perforation.  Currently not toxic and does not require hospitalization or intervention at this time.  Had an extensive discussion with patient regarding the role for elective sigmoid colectomy.  I do think that we will need to perform this at some point in time.  On the other hand he does have significant chronic issues such as chronic pain.  He is trying to enroll into a physical therapy program and try to wean off from the narcotics.  I do think that this is prudent and is in his best interest.  I also counseled him regarding weight management but I do understand that this is a significant barrier. Would like to see him in a couple months and at that time we assess him and hopefully he will be in a better state of health to undergo elective sigmoid colectomy. Note that I have spent greater than 45 minutes in this encounter including personally reviewing images, counseling the patient, placing orders and performing appropriate documentation.  Sterling Big, MD [facility] General Surgeon

## 2021-03-09 ENCOUNTER — Other Ambulatory Visit: Payer: Self-pay | Admitting: Nurse Practitioner

## 2021-03-09 DIAGNOSIS — F339 Major depressive disorder, recurrent, unspecified: Secondary | ICD-10-CM

## 2021-03-12 ENCOUNTER — Other Ambulatory Visit: Payer: Self-pay | Admitting: Physician Assistant

## 2021-03-12 DIAGNOSIS — E038 Other specified hypothyroidism: Secondary | ICD-10-CM

## 2021-03-20 ENCOUNTER — Ambulatory Visit: Payer: Medicare HMO

## 2021-03-24 ENCOUNTER — Other Ambulatory Visit: Payer: Self-pay | Admitting: Nurse Practitioner

## 2021-03-24 DIAGNOSIS — F339 Major depressive disorder, recurrent, unspecified: Secondary | ICD-10-CM

## 2021-03-27 ENCOUNTER — Telehealth: Payer: Medicare Other

## 2021-04-05 DIAGNOSIS — M25559 Pain in unspecified hip: Secondary | ICD-10-CM | POA: Diagnosis not present

## 2021-04-05 DIAGNOSIS — M5459 Other low back pain: Secondary | ICD-10-CM | POA: Diagnosis not present

## 2021-04-10 DIAGNOSIS — M5459 Other low back pain: Secondary | ICD-10-CM | POA: Diagnosis not present

## 2021-04-10 DIAGNOSIS — M25559 Pain in unspecified hip: Secondary | ICD-10-CM | POA: Diagnosis not present

## 2021-04-11 ENCOUNTER — Ambulatory Visit: Payer: Medicare Other | Admitting: Nurse Practitioner

## 2021-04-12 DIAGNOSIS — M25559 Pain in unspecified hip: Secondary | ICD-10-CM | POA: Diagnosis not present

## 2021-04-12 DIAGNOSIS — M5459 Other low back pain: Secondary | ICD-10-CM | POA: Diagnosis not present

## 2021-04-15 ENCOUNTER — Telehealth: Payer: Self-pay | Admitting: Student-PharmD

## 2021-04-15 NOTE — Progress Notes (Addendum)
General Review Call  [name],[name]  [id number] 60 years, Male  DOB: 07/03/1960  M: [phone]  General Review [facility]) Completed by [name] [name] on 04/15/2021  Chart Review What recent interventions/DTPs have been made by any provider to improve the patient's conditions in the last 3 months?:  Consults: 03/04/21 General Surgery [name], [name], MD. Diverticulitis of large intestine with bleeding, unspecified complication status. STOPPED Pregabalin, Oxycodone, Meloxicam, Hydrocodone, Doxycycline, Docusate, Cyclobenzaprine.  Any recent hospitalizations or ED visits since last visit with CPP?: Yes  Brief Summary (including Medication and/or Diagnosis changes):: 02/01/21 [facility] (2 Hours) [name] Bill, MD. For COLONOSCOPY WITH PROPOFOL and  ESOPHAGOGASTRODUODENOSCOPY (EGD) WITH PROPOFOL.  Adherence Review Adherence rates for STAR metric medications: None.  Adherence rates for medications indicated for disease state being reviewed: None.  Does the patient have >5 day gap between last estimated fill dates for any of the above medications?: No  Disease State Questions Able to connect with the Patient?: Yes  Did patient have any problems with their health recently?: No  Did patient have any problems with their pharmacy?: No  Does patient have any issues or side effects with their medications?: No  Additional  information to pass to Patient's CPP?: No  Anything we can do to help take better care of Patient?: No  Engagement Notes [name] [name] on 03/28/2021 02:14 PM [facility] Chart Review: 10 min 03/28/21 [facility] Assessment call time spent: 5 min 03/28/21 CPP Office Visit Documentation: [facility] Care Plan Completion: CPP Coordination of Care: CPP Care Plan Review:  [name] [name], RMA Healthcare Concierge  [phone]  Pharmacist Review Adherence gaps identified?: No Drug Therapy Problems identified?: No Assessment: Controlled  [name] [name] Clinical Pharmacist

## 2021-04-16 ENCOUNTER — Other Ambulatory Visit: Payer: Self-pay

## 2021-04-16 DIAGNOSIS — E039 Hypothyroidism, unspecified: Secondary | ICD-10-CM | POA: Diagnosis not present

## 2021-04-16 DIAGNOSIS — K219 Gastro-esophageal reflux disease without esophagitis: Secondary | ICD-10-CM | POA: Diagnosis not present

## 2021-04-16 MED ORDER — OMEPRAZOLE 20 MG PO CPDR: DELAYED_RELEASE_CAPSULE | ORAL | 2 refills | Status: DC

## 2021-04-17 DIAGNOSIS — M25559 Pain in unspecified hip: Secondary | ICD-10-CM | POA: Diagnosis not present

## 2021-04-17 DIAGNOSIS — M5459 Other low back pain: Secondary | ICD-10-CM | POA: Diagnosis not present

## 2021-04-19 DIAGNOSIS — M5459 Other low back pain: Secondary | ICD-10-CM | POA: Diagnosis not present

## 2021-04-19 DIAGNOSIS — M25559 Pain in unspecified hip: Secondary | ICD-10-CM | POA: Diagnosis not present

## 2021-04-24 DIAGNOSIS — M25559 Pain in unspecified hip: Secondary | ICD-10-CM | POA: Diagnosis not present

## 2021-04-24 DIAGNOSIS — M5459 Other low back pain: Secondary | ICD-10-CM | POA: Diagnosis not present

## 2021-04-26 DIAGNOSIS — M5459 Other low back pain: Secondary | ICD-10-CM | POA: Diagnosis not present

## 2021-04-26 DIAGNOSIS — M25559 Pain in unspecified hip: Secondary | ICD-10-CM | POA: Diagnosis not present

## 2021-05-01 ENCOUNTER — Telehealth: Payer: Self-pay

## 2021-05-01 ENCOUNTER — Ambulatory Visit: Payer: Medicare Other

## 2021-05-01 DIAGNOSIS — M25559 Pain in unspecified hip: Secondary | ICD-10-CM | POA: Diagnosis not present

## 2021-05-01 DIAGNOSIS — M5459 Other low back pain: Secondary | ICD-10-CM | POA: Diagnosis not present

## 2021-05-01 NOTE — Telephone Encounter (Signed)
Copy of discharge letter placed in scan. ?

## 2021-05-01 NOTE — Telephone Encounter (Signed)
Patient being mailed a discharge letter due to non-compliancy.  ?

## 2021-05-08 DIAGNOSIS — M25559 Pain in unspecified hip: Secondary | ICD-10-CM | POA: Diagnosis not present

## 2021-05-08 DIAGNOSIS — M5459 Other low back pain: Secondary | ICD-10-CM | POA: Diagnosis not present

## 2021-05-14 DIAGNOSIS — M25559 Pain in unspecified hip: Secondary | ICD-10-CM | POA: Diagnosis not present

## 2021-05-14 DIAGNOSIS — M5459 Other low back pain: Secondary | ICD-10-CM | POA: Diagnosis not present

## 2021-05-21 DIAGNOSIS — M5459 Other low back pain: Secondary | ICD-10-CM | POA: Diagnosis not present

## 2021-05-21 DIAGNOSIS — M25559 Pain in unspecified hip: Secondary | ICD-10-CM | POA: Diagnosis not present

## 2021-05-21 DIAGNOSIS — Z79899 Other long term (current) drug therapy: Secondary | ICD-10-CM | POA: Diagnosis not present

## 2021-05-21 DIAGNOSIS — M47817 Spondylosis without myelopathy or radiculopathy, lumbosacral region: Secondary | ICD-10-CM | POA: Diagnosis not present

## 2021-05-27 DIAGNOSIS — M5459 Other low back pain: Secondary | ICD-10-CM | POA: Diagnosis not present

## 2021-05-27 DIAGNOSIS — M25559 Pain in unspecified hip: Secondary | ICD-10-CM | POA: Diagnosis not present

## 2021-05-29 ENCOUNTER — Telehealth: Payer: Medicare HMO

## 2021-05-29 ENCOUNTER — Telehealth: Payer: Medicare Other

## 2021-06-03 ENCOUNTER — Encounter: Payer: Self-pay | Admitting: Surgery

## 2021-06-03 ENCOUNTER — Ambulatory Visit (INDEPENDENT_AMBULATORY_CARE_PROVIDER_SITE_OTHER): Payer: Medicare Other | Admitting: Surgery

## 2021-06-03 VITALS — BP 137/79 | HR 59 | Temp 98.2°F | Ht 73.0 in | Wt 348.8 lb

## 2021-06-03 DIAGNOSIS — K5733 Diverticulitis of large intestine without perforation or abscess with bleeding: Secondary | ICD-10-CM

## 2021-06-03 DIAGNOSIS — M5459 Other low back pain: Secondary | ICD-10-CM | POA: Diagnosis not present

## 2021-06-03 DIAGNOSIS — M25559 Pain in unspecified hip: Secondary | ICD-10-CM | POA: Diagnosis not present

## 2021-06-03 MED ORDER — NEOMYCIN SULFATE 500 MG PO TABS: ORAL_TABLET | ORAL | 0 refills | Status: DC

## 2021-06-03 MED ORDER — POLYETHYLENE GLYCOL 3350 17 GM/SCOOP PO POWD: ORAL | 0 refills | Status: DC

## 2021-06-03 MED ORDER — BISACODYL 5 MG PO TBEC: DELAYED_RELEASE_TABLET | ORAL | 0 refills | Status: DC

## 2021-06-03 MED ORDER — METRONIDAZOLE 500 MG PO TABS: ORAL_TABLET | ORAL | 0 refills | Status: DC

## 2021-06-03 NOTE — Patient Instructions (Addendum)
We will have you seen here in June for a pre op appointment with Dr Everlene FarrierPabon.  ? ?We have discussed removing a portion of your damaged colon through 4 small incisions today. We will scheduled this surgery at [facility] with Dr. Everlene FarrierPabon. Please plan a hospital stay of 3-7 days for surgery and recovery time. ? ?We will have you complete a bowel prep prior to your surgery. Please see information provided.  ? ?You have also been given a (Blue) Pre-Care Sheet with more information regarding your particular surgery. Our surgery scheduler will call you to verify surgery date and to go over information.  ?Please review all information given. ? ?You will need to arrange to be out of work for approximately 2 weeks and then you may return with a lifting restriction for 4 more weeks. If you have FMLA or Disability paperwork that needs to be filled out, please have your company fax your paperwork to [phone] or you may drop this by either office. This paperwork will be filled out within 3 days after your surgery has been completed. ? ?Please call our office with any questions or concerns prior to your scheduled surgery. ? ? ?Laparoscopic Colectomy ?Laparoscopic colectomy is surgery to remove part or all of the large intestine (colon). This procedure may be used to treat several conditions, including: ?Inflammation and infection of the colon (diverticulitis). ?Tumors or masses in the colon. ?Inflammatory bowel disease, such as Crohn disease or ulcerative colitis. Colectomy is an option when symptoms cannot be controlled with medicines. ?Bleeding from the colon that cannot be controlled by another method. ?Blockage or obstruction of the colon. ? ?Tell a health care provider about: ?Any allergies you have. ?All medicines you are taking, including vitamins, herbs, eye drops, creams, and over-the-counter medicines. ?Any problems you or family members have had with anesthetic medicines. ?Any blood disorders you have. ?Any surgeries you  have had. ?Any medical conditions you have. ?What are the risks? ?Generally, this is a safe procedure. However, problems may occur, including: ?Infection. ?Bleeding. ?Allergic reactions to medicines or dyes. ?Damage to other structures or organs. ?Leaking from where the colon was sewn together. ?Future blockage of the small intestines from scar tissue. Another surgery may be needed to repair this. ?Needing to convert to an open procedure. Complications such as damage to other organs or excessive bleeding may require the surgeon to convert from a laparoscopic procedure to an open procedure. This involves making a larger incision in the abdomen. ? ?What happens before the procedure? ?Staying hydrated ?Follow instructions from your health care provider about hydration, which may include: ?Up to 2 hours before the procedure - you may continue to drink clear liquids, such as water, apple juice, black coffee, and plain tea. ? ?Medicines ?Ask your health care provider about: ?Changing or stopping your regular medicines. This is especially important if you are taking diabetes medicines or blood thinners. ?Taking medicines such as aspirin and ibuprofen. These medicines can thin your blood. Do not take these medicines before your procedure if your health care provider instructs you not to. ?You may be given antibiotic medicine to clean out bacteria from your colon. Follow the directions carefully and take the medicine at the correct time. ?General instructions ?You may be prescribed an oral bowel prep to clean out your colon in preparation for the surgery: ?Follow instructions from your health care provider about how to do this. ?Do not eat or drink anything else after you have started the bowel prep, unless  your health care provider tells you it is safe to do so. ?Do not use any products that contain nicotine or tobacco, such as cigarettes and e-cigarettes. If you need help quitting, ask your health care provider. ?What  happens during the procedure? ?To reduce your risk of infection: ?Your health care team will wash or sanitize their hands. ?Your skin will be washed with soap. ?An IV tube will be inserted into one of your veins to deliver fluid and medication. ?You will be given one of the following: ?A medicine to help you relax (sedative). ?A medicine to make you fall asleep (general anesthetic). ?Small monitors will be connected to your body. They will be used to check your heart, blood pressure, and oxygen level. ?A breathing tube may be placed into your lungs during the procedure. ?A thin, flexible tube (catheter) will be placed into your bladder to drain urine. ?A tube may be placed through your nose and into your stomach to drain stomach fluids (nasogastric tube, or NG tube). ?Your abdomen will be filled with air so it expands. This gives the surgeon more room to operate and makes your organs easier to see. ?Several small cuts (incisions) will be made in your abdomen. ?A thin, lighted tube with a tiny camera on the end (laparoscope) will be put through one of the small incisions. The camera on the laparoscope will send a picture to a computer screen in the operating room. This will give the surgeon a good view inside your abdomen. ?Hollow tubes will be put through the other small incisions in your abdomen. The tools that are needed for the procedure will be put through these tubes. ?Clamps or staples will be put on both ends of the diseased part of the colon. ?The part of the intestine between the clamps or staples will be removed. ?If possible, the ends of the healthy colon that remain will be stitched (sutured) or stapled together to allow your body to pass waste (stool). ?Sometimes, the remaining colon cannot be stitched back together. If this is the case, a colostomy will be needed. If you need a colostomy: ?An opening to the outside of your body (stoma) will be made through your abdomen. ?The end of your colon will be  brought to the opening. It will be stitched to the skin. ?A bag will be attached to the opening. Stool will drain into this removable bag. ?The colostomy may be temporary or permanent. ?The incisions from the colectomy will be closed with sutures or staples. ?The procedure may vary among health care providers and hospitals. ?What happens after the procedure? ?Your blood pressure, heart rate, breathing rate, and blood oxygen level will be monitored until the medicines you were given have worn off. ?You will receive fluids through an IV tube until your bowels start to work properly. ?Once your bowels are working again, you will be given clear liquids first and then solid food as tolerated. ?You will be given medicines to control your pain and nausea, if needed. ?Do not drive for 24 hours if you were given a sedative. ?This information is not intended to replace advice given to you by your health care provider. Make sure you discuss any questions you have with your health care provider. ?Document Released: 04/26/2002 Document Revised: 11/05/2015 Document Reviewed: 11/05/2015 ?Elsevier Interactive Patient Education ? 2018 Elsevier [employer] ? ? ? ? ?Laparoscopic Colectomy, Care After ?This sheet gives you information about how to care for yourself after your procedure. Your health care provider  may also give you more specific instructions. If you have problems or questions, contact your health care provider. ?What can I expect after the procedure? ?After your procedure, it is common to have the following: ?Pain in your abdomen, especially in the incision areas. You will be given medicine to control the pain. ?Tiredness. This is a normal part of the recovery process. Your energy level will return to normal over the next several weeks. ?Changes in your bowel movements, such as constipation or needing to go more often. Talk with your health care provider about how to manage this. ? ?Follow these instructions at  home: ?Medicines ?Take over-the-counter and prescription medicines only as told by your health care provider. ?Do not drive or use heavy machinery while taking prescription pain medicine. ?Do not drink alcohol while taking p

## 2021-06-03 NOTE — Progress Notes (Signed)
Request for Medical Clearance given to Heath Lark office at [facility].  ?

## 2021-06-04 ENCOUNTER — Telehealth: Payer: Self-pay | Admitting: Surgery

## 2021-06-04 NOTE — Progress Notes (Signed)
Outpatient Surgical Follow Up ? ?06/04/2021 ? ?[name] [name] is an 61 y.o. male.  ? ?Chief Complaint  ?Patient presents with  ? Follow-up  ? Diverticulitis  ? ? ?HPI: 61 year old male well-known to me with at least 3 episodes of diverticulitis the last one requiring hospitalization. Last episode was a contained perforation 2022.  No need for percutaneous drainage.  No fevers no chills.  He does have a BMI of 46.  He does have a history of chronic pain. ? ?He has started physical therapy to help and treat his chronic pain.  His wife is very involved. ?No fevers no chills.  He has never had a colonoscopy before. HE Does have a history of hepatitis C.  No prior abdominal operations. ?He Is able to perform more than 4 METS of activity without any shortness of breath or chest pain.  He does use a cane for balance and due to his back condition. ?He did have a CT scan of the abdomen pelvis that I have personally reviewed showing evidence of diverticulitis with a contained perforation.  He does have an enlarged liver and hiatal hernia as well. ?Recently underwent colonoscopy.  I have personally reviewed the images showing diverticulosis without any other surprises.  He does still endorses intermittent suprapubic and left lower quadrant pain. ?Last CT did not show evidence of diverticulitis but last colonoscopy did show some colitis in the sigmoid colon. ? ?Past Medical History:  ?Diagnosis Date  ? Anxiety   ? Arthritis   ? Depression   ? Diverticulitis   ? Fibromyalgia   ? GERD (gastroesophageal reflux disease)   ? Glaucoma   ? Hepatitis C   ? Hypothyroidism   ? Juvenile epilepsy (HCC)   ? Positive TB test   ? Sleep apnea   ? Uses CPap machine  ? ? ?Past Surgical History:  ?Procedure Laterality Date  ? COLONOSCOPY WITH PROPOFOL N/A 02/01/2021  ? Procedure: COLONOSCOPY WITH PROPOFOL;  Surgeon: Regis Bill, MD;  [facility]COPY;  Service: Endoscopy;  Laterality: N/A;  ? ESOPHAGOGASTRODUODENOSCOPY (EGD) WITH  PROPOFOL N/A 02/01/2021  ? Procedure: ESOPHAGOGASTRODUODENOSCOPY (EGD) WITH PROPOFOL;  Surgeon: Regis Bill, MD;  Location: [facility] ENDOSCOPY;  Service: Endoscopy;  Laterality: N/A;  ? LIVER BIOPSY    ? ? ?Family History  ?Problem Relation Age of Onset  ? Aneurysm Mother   ? Cancer Father   ? Mental illness Sister   ? Drug abuse Paternal Aunt   ? Drug abuse Maternal Uncle   ? Mental illness Sister   ? ? ?Social History:  reports that he has never smoked. He has never used smokeless tobacco. He reports that he does not currently use alcohol. He reports that he does not currently use drugs after having used the following drugs: Marijuana. ? ?Allergies:  ?Allergies  ?Allergen Reactions  ? Celecoxib Anxiety  ? Zoloft [Sertraline Hcl] Anxiety  ? ? ?Medications reviewed. ? ? ? ?ROS ?Full ROS performed and is otherwise negative other than what is stated in HPI ? ? ?BP 137/79   Pulse (!) 59   Temp 98.2 ?F (36.8 ?C) (Oral)   Ht 6\' 1"  (1.854 m)   Wt (!) 348 lb 12.8 oz (158.2 kg)   SpO2 95%   BMI 46.02 kg/m?  ? ?Physical Exam ?Physical Exam ?Vitals and nursing note reviewed. Exam conducted with a chaperone present.  ?Constitutional:   ?   General: He is not in acute distress. ?   Appearance: Normal appearance. He  is obese. He is not ill-appearing or toxic-appearing.  ?Eyes:  ?   General: No scleral icterus.    ?   Right eye: No discharge.     ?   Left eye: No discharge.  ?Cardiovascular:  ?   Rate and Rhythm: Normal rate and regular rhythm.  ?   Heart sounds: No murmur heard. ?Pulmonary:  ?   Effort: Pulmonary effort is normal. No respiratory distress.  ?   Breath sounds: Normal breath sounds. No stridor. No wheezing.  ?Abdominal:  ?   General: Abdomen is flat. There is no distension.  ?   Palpations: Abdomen is soft. There is no mass.  ?   Tenderness: There is abdominal tenderness. There is no guarding.  ?   Hernia: No hernia is present.  ?   Comments: LLQ tenderness, no rebound  ?Musculoskeletal:     ?   General:  No swelling or tenderness. Normal range of motion.  ?   Cervical back: Normal range of motion and neck supple. No rigidity or tenderness.  ?Lymphadenopathy:  ?   Cervical: No cervical adenopathy.  ?Skin: ?   General: Skin is warm and dry.  ?   Capillary Refill: Capillary refill takes less than 2 seconds.  ?Neurological:  ?   Mental Status: He is alert.  ?Psychiatric:     ?   Mood and Affect: Mood normal.     ?   Behavior: Behavior normal.     ?   Thought Content: Thought content normal.     ?   Judgment: Judgment normal.  ? ? ? ?Assessment/Plan: ?61 year old male with history of recurrent uncomplicated diverticulitis with prior abscess and contained perforation.  Currently not toxic and does not require hospitalization or intervention at this time. I Had an extensive discussion with patient and wife  regarding the role for elective sigmoid colectomy.  I do think that we will need to perform this at some point in time.   ?He wishes to continue to manage pain with physical therapy and has talked to the pain doctor.  Pain doctor agrees that Feeny of his pain is related to diverticulitis. ?Pain management recommended also to have diverticulitis addressed since this is is a significant source of his pain. ?Unfortunately no good answers regarding management of chronic pain.  He does have episode of the recurrent diverticulitis and evidence of prior contained perforation.  I was very candid with him and discussed with him that there is definitely a role for sigmoid colectomy however I cannot guarantee that the sigmoid colectomy will eliminate all his chronic pain issues. ?He understands.  He also wishes to do some diet and exercise to improve his weight I definitely encourage this.  I will see him back in about a month and he is thinking about doing elective sigmoid colectomy in about 6 to 8 weeks. ? I also counseled him regarding weight management but I do understand that this is a significant barrier. ?Note that I have  spent greater than 40 minutes in this encounter including personally reviewing images, counseling the patient, placing orders and performing appropriate documentation. ? ?Sterling Big, MD FACS ?General Surgeon  ?

## 2021-06-04 NOTE — Telephone Encounter (Signed)
Outgoing call is made, spoke with wife, [name] [name], they are informed of the following regarding scheduled surgery:  ? ?Pre-Admission date/time, COVID Testing date and Surgery date. ? ?Surgery Date: 07/23/21 ?Preadmission Testing Date: 07/12/21 (phone 8a-1p) ?Covid Testing Date: Not needed.    ? ?Patient has been made aware to call [phone], between 1-3:00pm the day before surgery, to find out what time to arrive for surgery.   ? ?

## 2021-06-05 IMAGING — CR DG ABDOMEN 1V
1 series · 3 of 3 positions shown · non-contrast
Comparison: January 11, 2020

CLINICAL DATA: Pain

EXAM:
ABDOMEN - 1 VIEW

[Series 1: dg abd 1 view · 0.14mm/px · 3 of 3 slices shown]
[im 1/3]
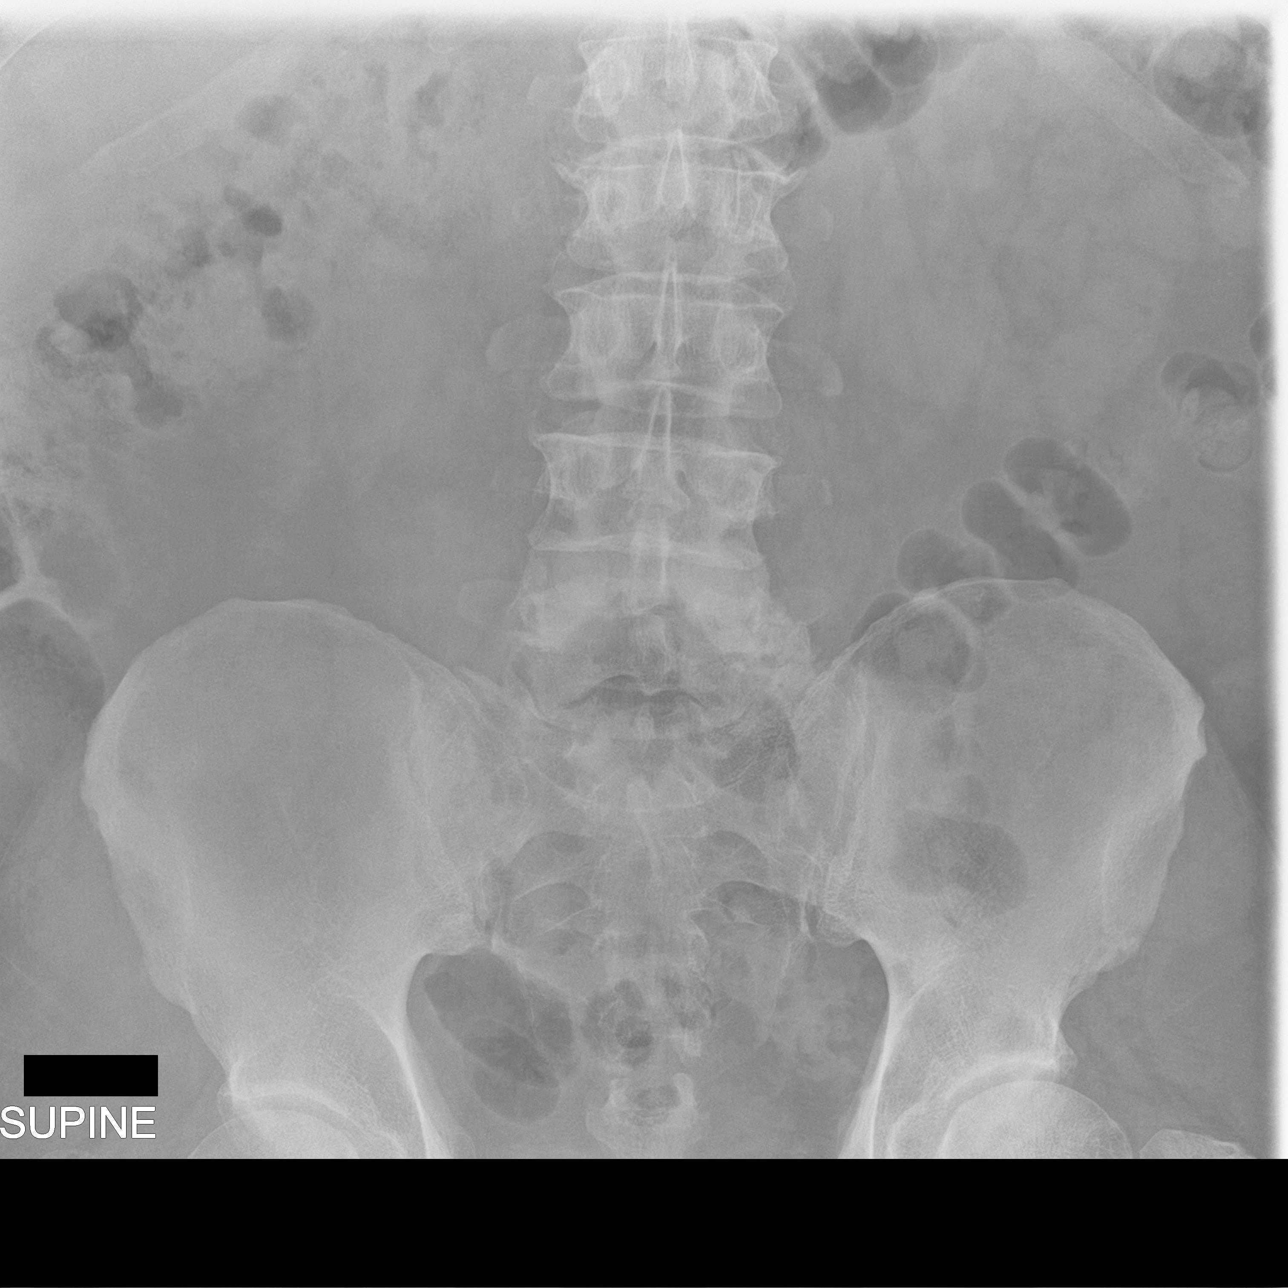
[im 2/3]
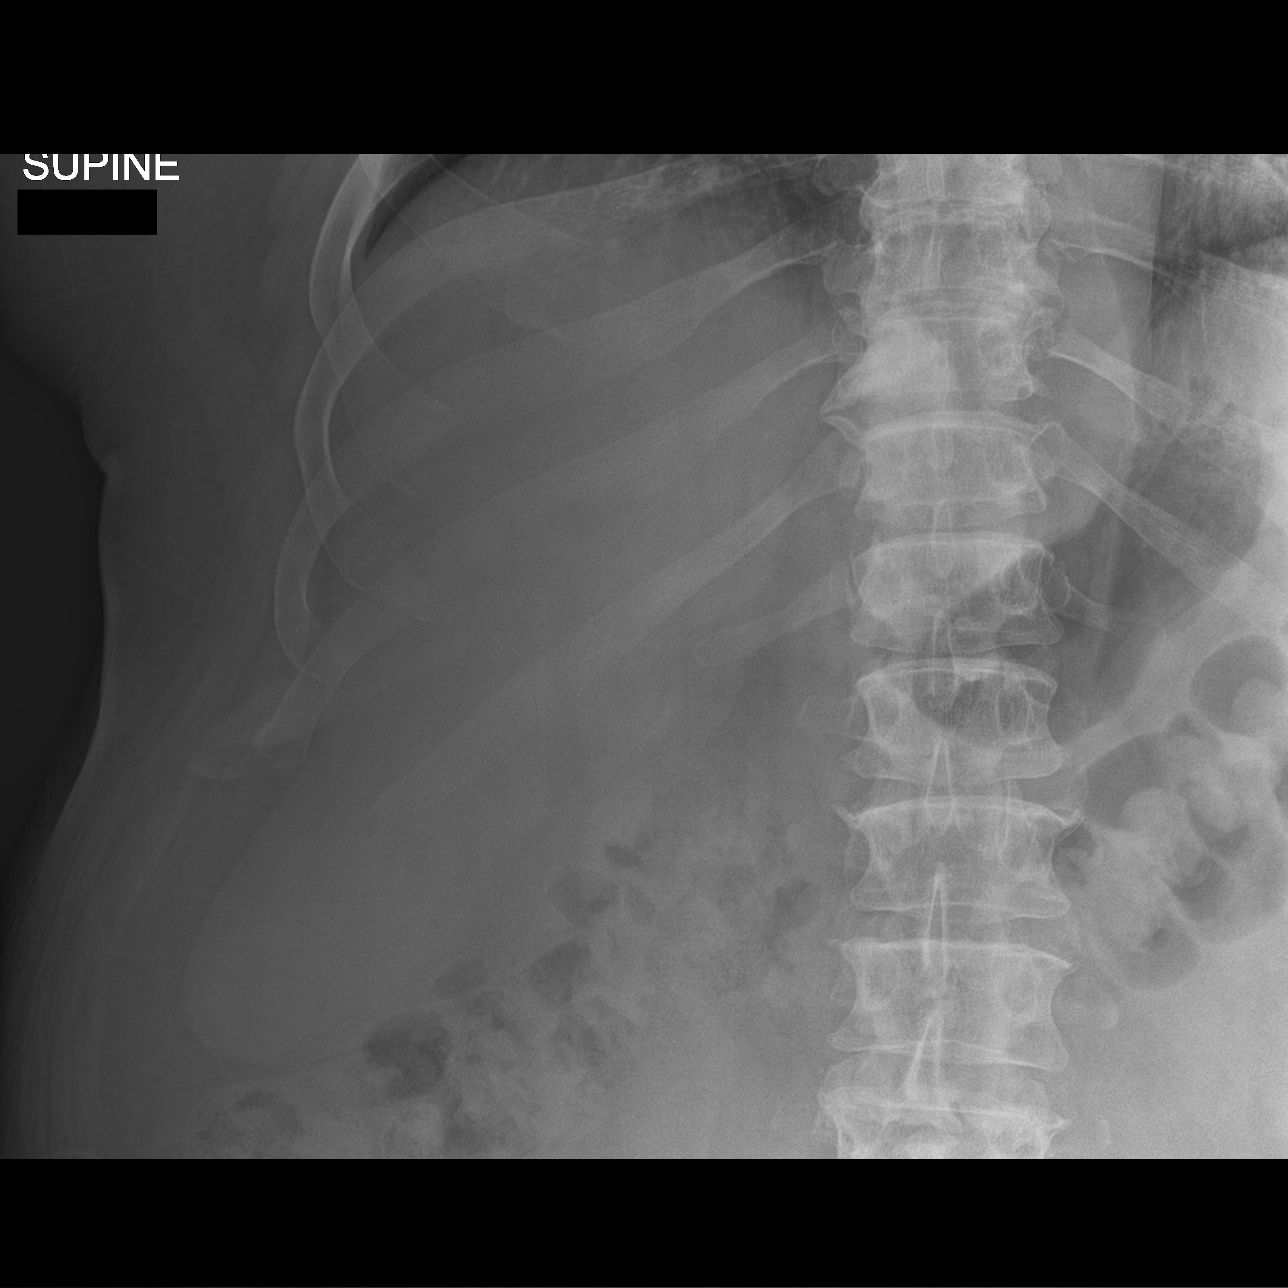
[im 3/3]
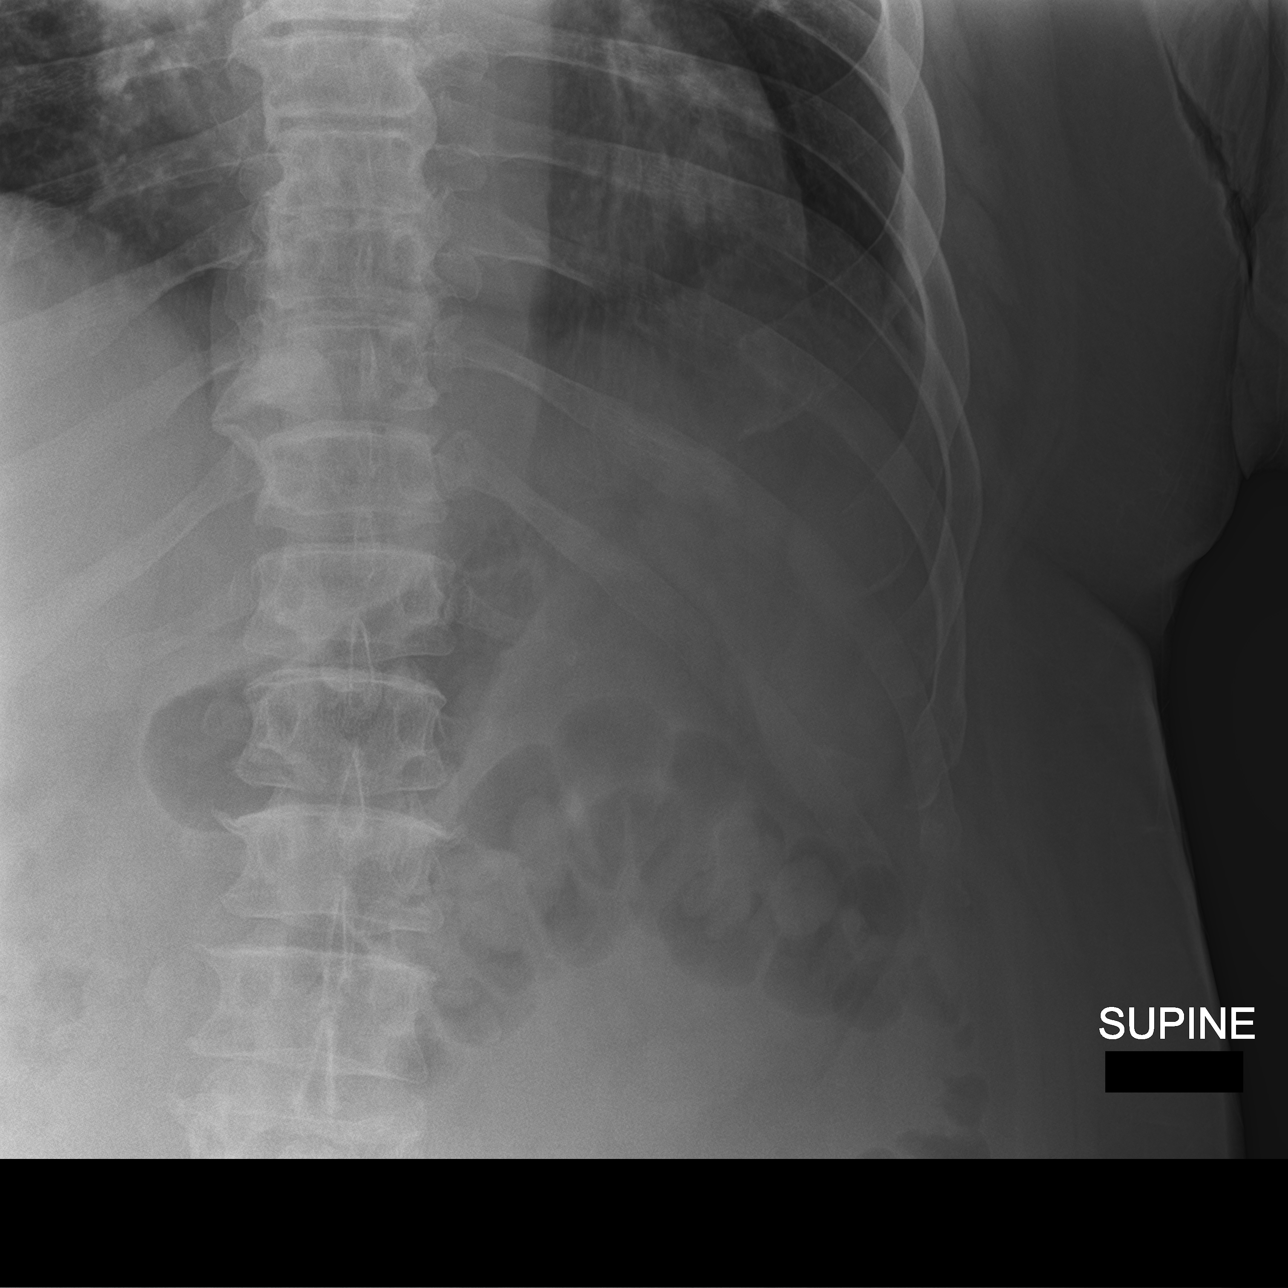

[3 of 3 positions shown; findings below may reference images not displayed]

FINDINGS: Air and stool-filled nondilated loops of bowel. Moderate colonic
stool burden diffusely throughout the colon. Visualized lung bases
are unremarkable. Degenerative changes of the lumbar spine.
IMPRESSION: Nonobstructive bowel gas pattern. Moderate colonic stool burden
diffusely throughout the colon.

## 2021-06-11 NOTE — Progress Notes (Signed)
? ? ?I,[name] [name],acting as a scribe for [name] Sprout, FNP.,have documented all relevant documentation on the behalf of [name] Sprout, FNP,as directed by  [name] Sprout, FNP while in the presence of [name] Sprout, FNP.  ? ?New patient visit ? ? ?Patient: [name] [name]   DOB: 102/16/1962   61 y.o. Male  MRN: [id number] ?Visit Date: 06/12/2021 ? ?Today's healthcare provider: [name], FNP  ?Patient presents for new patient visit to establish care.  Introduced to Designer, jewellery role and practice setting.  All questions answered.  Discussed provider/patient relationship and expectations. ? ?Chief Complaint  ?Patient presents with  ? Establish Care  ? Medical Clearance  ? ?Subjective  ?  ?[name] is a 61 y.o. male who presents today as a new patient to establish care.  ?HPI  ?Surgical Clearance: ?Patient is scheduled to have a sigmoid colectomy on 07/23/2021.  ? ?Past Medical History:  ?Diagnosis Date  ? Anxiety   ? Arthritis   ? Depression   ? Diverticulitis   ? Fibromyalgia   ? GERD (gastroesophageal reflux disease)   ? Glaucoma   ? Hepatitis C   ? Hypothyroidism   ? Juvenile epilepsy ([place])   ? Positive TB test   ? Sleep apnea   ? Uses CPap machine  ? ?Past Surgical History:  ?Procedure Laterality Date  ? COLONOSCOPY WITH PROPOFOL N/A 02/01/2021  ? Procedure: COLONOSCOPY WITH PROPOFOL;  Surgeon: Lesly Rubenstein, MD;  Location: [facility] ENDOSCOPY;  Service: Endoscopy;  Laterality: N/A;  ? ESOPHAGOGASTRODUODENOSCOPY (EGD) WITH PROPOFOL N/A 02/01/2021  ? Procedure: ESOPHAGOGASTRODUODENOSCOPY (EGD) WITH PROPOFOL;  Surgeon: Lesly Rubenstein, MD;  Location: [facility] ENDOSCOPY;  Service: Endoscopy;  Laterality: N/A;  ? LIVER BIOPSY    ? ?Family Status  ?Relation Name Status  ? Mother  Deceased  ? Father  Deceased  ? Sister  (Not Specified)  ? Sister  (Not Specified)  ? MGM  (Not Specified)  ? MGF  (Not Specified)  ? Mat Uncle  (Not Specified)  ? [name] [name]  (Not Specified)  ? ?Family History  ?Problem Relation  Age of Onset  ? Aneurysm Mother   ? Cancer Father   ? Lung cancer Father   ? Mental illness Sister   ? Mental illness Sister   ? Brain cancer Maternal Grandmother   ? Emphysema Maternal Grandfather   ? Drug abuse Maternal Uncle   ? Drug abuse Paternal Aunt   ? ?Social History  ? ?Socioeconomic History  ? Marital status: Married  ?  Spouse name: Not on file  ? Number of children: 1  ? Years of education: Not on file  ? Highest education level: Not on file  ?Occupational History  ? Not on file  ?Tobacco Use  ? Smoking status: Never  ? Smokeless tobacco: Never  ?Vaping Use  ? Vaping Use: Never used  ?Substance and Sexual Activity  ? Alcohol use: Not Currently  ? Drug use: Not Currently  ?  Types: Marijuana  ? Sexual activity: Not on file  ?Other Topics Concern  ? Not on file  ?Social History Narrative  ? ** Merged History Encounter **  ?    ? ?Social Determinants of Health  ? ?Financial Resource Strain: Not on file  ?Food Insecurity: Not on file  ?Transportation Needs: Not on file  ?Physical Activity: Not on file  ?Stress: Not on file  ?Social Connections: Not on file  ? ?Outpatient Medications Prior to Visit  ?  Medication Sig  ? amoxicillin (AMOXIL) 500 MG tablet Take 500 mg by mouth 3 (three) times daily.  ? bisacodyl (DULCOLAX) 5 MG EC tablet Take all 4 tablets at 8 am the morning prior to your surgery.  ? levothyroxine (SYNTHROID) 75 MCG tablet TAKE 1 TABLET BY MOUTH EVERY DAY  ? metroNIDAZOLE (FLAGYL) 500 MG tablet Take 2 tablets at 8AM, take 2 tablets at [employer], and take 2 tablets at 8PM the day prior to your surgery  ? neomycin (MYCIFRADIN) 500 MG tablet Take 2 tablet at 8am, take 2 tablets at 2pm, and take 2 tablets at 8pm the day prior to your surgery  ? NON FORMULARY cpap device  ? polyethylene glycol powder (MIRALAX) 17 GM/SCOOP powder Mix full container in 64 ounces of Gatorade or other clear liquid. NO Red  ? [DISCONTINUED] busPIRone (BUSPAR) 7.5 MG tablet TAKE 1 TABLET BY MOUTH TWICE A DAY  ? [DISCONTINUED]  Crisaborole (EUCRISA) 2 % OINT Apply 1 application topically 2 (two) times daily as needed.  ? [DISCONTINUED] omeprazole (PRILOSEC) 20 MG capsule TAKE 1 CAPSULE BY MOUTH EVERY DAY  ? [DISCONTINUED] TIZANIDINE HCL PO Take 4 mg by mouth 2 (two) times daily as needed.  ? [DISCONTINUED] naproxen (NAPROSYN) 500 MG tablet Take 500 mg by mouth 2 (two) times daily. (Patient not taking: Reported on 06/12/2021)  ? ?No facility-administered medications prior to visit.  ? ?Allergies  ?Allergen Reactions  ? Celecoxib Anxiety  ? Zoloft [Sertraline Hcl] Anxiety  ? ? ?Immunization History  ?Administered Date(s) Administered  ? Influenza Inj Mdck Quad Pf 12/06/2019  ? ? ?Health Maintenance  ?Topic Date Due  ? COVID-19 Vaccine (1) Never done  ? TETANUS/TDAP  Never done  ? Zoster Vaccines- Shingrix (1 of 2) Never done  ? INFLUENZA VACCINE  09/17/2021  ? COLONOSCOPY (Pts 45-8yrs Insurance coverage will need to be confirmed)  02/02/2024  ? Hepatitis C Screening  Completed  ? HIV Screening  Completed  ? HPV VACCINES  Aged Out  ? ? ?Patient Care Team: ?[name] Sprout, FNP as PCP - General (Family Medicine) ?Alena Bills, [facility] as Pharmacist (Pharmacist) ? ?Review of Systems  ?Constitutional:  Negative for appetite change, chills, fatigue and fever.  ?HENT:  Negative for congestion, ear pain, hearing loss, nosebleeds and trouble swallowing.   ?Eyes:  Negative for pain and visual disturbance.  ?Respiratory:  Positive for apnea. Negative for cough, chest tightness and shortness of breath.   ?Cardiovascular:  Negative for chest pain, palpitations and leg swelling.  ?Gastrointestinal:  Positive for abdominal pain. Negative for blood in stool, constipation, diarrhea, nausea and vomiting.  ?Endocrine: Negative for polydipsia, polyphagia and polyuria.  ?Genitourinary:  Negative for dysuria and flank pain.  ?Musculoskeletal:  Positive for back pain, joint swelling, myalgias, neck pain and neck stiffness. Negative for arthralgias.  ?Skin:   Negative for color change, rash and wound.  ?Neurological:  Negative for dizziness, tremors, seizures, speech difficulty, weakness, light-headedness and headaches.  ?Psychiatric/Behavioral:  Negative for behavioral problems, confusion, decreased concentration, dysphoric mood and sleep disturbance. The patient is not nervous/anxious.   ?All other systems reviewed and are negative. ? ? ? ? Objective  ?  ?BP 126/87 (BP Location: Right Arm, Patient Position: Sitting, Cuff Size: Large)   Pulse 82   Temp 97.8 ?F (36.6 ?C) (Oral)   Resp 16   Ht 6\' 1"  (1.854 m)   Wt (!) 334 lb (151.5 kg)   SpO2 95% Comment: room air  BMI 44.07 kg/m?  ? ? ?  Physical Exam ?Vitals and nursing note reviewed.  ?Constitutional:   ?   General: He is awake. He is not in acute distress. ?   Appearance: Normal appearance. He is well-developed and well-groomed. He is obese. He is not ill-appearing, toxic-appearing or diaphoretic.  ?HENT:  ?   Head: Normocephalic and atraumatic.  ?   Jaw: There is normal jaw occlusion. No trismus, tenderness, swelling or pain on movement.  ?   Salivary Glands: Right salivary gland is not diffusely enlarged or tender. Left salivary gland is not diffusely enlarged or tender.  ?   Right Ear: Hearing normal.  ?   Left Ear: Hearing normal.  ?   Nose: Nose normal. No congestion or rhinorrhea.  ?   Right Turbinates: Not enlarged, swollen or pale.  ?   Left Turbinates: Not enlarged, swollen or pale.  ?   Right Sinus: No maxillary sinus tenderness or frontal sinus tenderness.  ?   Left Sinus: No maxillary sinus tenderness or frontal sinus tenderness.  ?   Mouth/Throat:  ?   Lips: Pink.  ?   Mouth: Mucous membranes are moist. No injury, lacerations, oral lesions or angioedema.  ?   Pharynx: Oropharynx is clear. Uvula midline. No pharyngeal swelling, oropharyngeal exudate or posterior oropharyngeal erythema.  ?   Tonsils: No tonsillar exudate or tonsillar abscesses.  ?   Comments: Hx of wisdom tooth extraction, R  side ?Eyes:  ?   General: Lids are normal. Vision grossly intact. Gaze aligned appropriately.     ?   Right eye: No discharge.     ?   Left eye: No discharge.  ?   Conjunctiva/sclera: Conjunctivae normal.  ?Neck:  ?   Thyroi

## 2021-06-12 ENCOUNTER — Encounter: Payer: Self-pay | Admitting: Family Medicine

## 2021-06-12 ENCOUNTER — Ambulatory Visit (INDEPENDENT_AMBULATORY_CARE_PROVIDER_SITE_OTHER): Payer: Medicare Other | Admitting: Family Medicine

## 2021-06-12 VITALS — BP 126/87 | HR 82 | Temp 97.8°F | Resp 16 | Ht 73.0 in | Wt 334.0 lb

## 2021-06-12 DIAGNOSIS — G4733 Obstructive sleep apnea (adult) (pediatric): Secondary | ICD-10-CM | POA: Diagnosis not present

## 2021-06-12 DIAGNOSIS — F339 Major depressive disorder, recurrent, unspecified: Secondary | ICD-10-CM | POA: Insufficient documentation

## 2021-06-12 DIAGNOSIS — S30860S Insect bite (nonvenomous) of lower back and pelvis, sequela: Secondary | ICD-10-CM

## 2021-06-12 DIAGNOSIS — Z01810 Encounter for preprocedural cardiovascular examination: Secondary | ICD-10-CM

## 2021-06-12 DIAGNOSIS — R1110 Vomiting, unspecified: Secondary | ICD-10-CM | POA: Insufficient documentation

## 2021-06-12 DIAGNOSIS — Z8619 Personal history of other infectious and parasitic diseases: Secondary | ICD-10-CM | POA: Diagnosis not present

## 2021-06-12 DIAGNOSIS — R110 Nausea: Secondary | ICD-10-CM | POA: Insufficient documentation

## 2021-06-12 DIAGNOSIS — K219 Gastro-esophageal reflux disease without esophagitis: Secondary | ICD-10-CM

## 2021-06-12 DIAGNOSIS — E039 Hypothyroidism, unspecified: Secondary | ICD-10-CM

## 2021-06-12 DIAGNOSIS — M5442 Lumbago with sciatica, left side: Secondary | ICD-10-CM

## 2021-06-12 DIAGNOSIS — Z1159 Encounter for screening for other viral diseases: Secondary | ICD-10-CM | POA: Diagnosis not present

## 2021-06-12 DIAGNOSIS — W57XXXS Bitten or stung by nonvenomous insect and other nonvenomous arthropods, sequela: Secondary | ICD-10-CM

## 2021-06-12 DIAGNOSIS — Z6841 Body Mass Index (BMI) 40.0 and over, adult: Secondary | ICD-10-CM

## 2021-06-12 DIAGNOSIS — M5441 Lumbago with sciatica, right side: Secondary | ICD-10-CM

## 2021-06-12 DIAGNOSIS — W57XXXA Bitten or stung by nonvenomous insect and other nonvenomous arthropods, initial encounter: Secondary | ICD-10-CM | POA: Insufficient documentation

## 2021-06-12 DIAGNOSIS — G8929 Other chronic pain: Secondary | ICD-10-CM

## 2021-06-12 NOTE — Assessment & Plan Note (Signed)
"  this is my alarm clock" chronic, stable with vomiting of bile ?Followed by GI and plan to removal gallbladder ?Denies need for medication to control a this time  ?

## 2021-06-12 NOTE — Assessment & Plan Note (Signed)
Chronic stable ?Morbid obesity ?Body mass index is 44.07 kg/m?. ?Discussed importance of healthy weight management ?Discussed diet and exercise ?Working on weight loss prior to upcoming procedure; encouraged to call insurance and check Rx benefits for obesity medication availability ? ?

## 2021-06-12 NOTE — Assessment & Plan Note (Signed)
Chronic, worsening ?Hx of opioid use; however, wishes to avoid at this time ?Plans to do injections after gallbladder removal ? ?

## 2021-06-12 NOTE — Assessment & Plan Note (Signed)
AM vomiting d/t chronic headaches; denies low BG or low BP ?Reports vomiting up small amounts of bile ?Previously seen by GI for colonoscopy and endoscopy ?Chronic, stable per pt report  ?

## 2021-06-12 NOTE — Assessment & Plan Note (Signed)
Initial treatment 07/2020; however, pt continues to have arthralgias and is requesting additional labs at this time ?

## 2021-06-12 NOTE — Assessment & Plan Note (Signed)
Chronic, previously stable ?Hx of 75 mcg synthroid qAM with water, prior to food/drink ?Will repeat labs with upcoming surgery planned for gallbladder removal ? ?

## 2021-06-12 NOTE — Assessment & Plan Note (Signed)
Chronic, stable ?Continues to use CPAP qHS ?Denies complaints at this time ?

## 2021-06-12 NOTE — Assessment & Plan Note (Signed)
Chronic, stable ?Previously used SSRI and counseling ?Currently not on medication and wishes to reconnect with an in person counselor ?Denies SI or HI and "wants to live for his 59 year old son" ?

## 2021-06-12 NOTE — Assessment & Plan Note (Signed)
EKG completed in office today; SR with low voltage in lower limb leads; no comparison; HR stable; no ectopy noted; Referral to cardiology given hx of OSA and questionable diastolic HF from conversation  ?

## 2021-06-12 NOTE — Assessment & Plan Note (Signed)
Chronic, treated, did not follow up for levels following tx ?Thinks tx was completed in "1990"  ?Hep C from tattoos during incarceration  ?

## 2021-06-12 NOTE — Assessment & Plan Note (Signed)
Chronic, stable with out medication ?Previously used 20 mg prilosec, did not use routinely  ?Continue to recommend weight reduction and diet modification as necessary  ?

## 2021-06-13 ENCOUNTER — Other Ambulatory Visit: Payer: Self-pay | Admitting: Family Medicine

## 2021-06-13 DIAGNOSIS — E785 Hyperlipidemia, unspecified: Secondary | ICD-10-CM

## 2021-06-13 DIAGNOSIS — E039 Hypothyroidism, unspecified: Secondary | ICD-10-CM

## 2021-06-13 MED ORDER — ROSUVASTATIN CALCIUM 10 MG PO TABS: 10.0000 mg | ORAL_TABLET | Freq: Every day | ORAL | 3 refills | Status: DC

## 2021-06-13 MED ORDER — LEVOTHYROXINE SODIUM 50 MCG PO TABS: 50.0000 ug | ORAL_TABLET | Freq: Every day | ORAL | 0 refills | Status: DC

## 2021-06-16 LAB — COMPREHENSIVE METABOLIC PANEL
ALT: 34 IU/L (ref 0–44)
AST: 34 IU/L (ref 0–40)
Albumin/Globulin Ratio: 1.3 (ref 1.2–2.2)
Albumin: 4.6 g/dL (ref 3.8–4.9)
Alkaline Phosphatase: 74 IU/L (ref 44–121)
BUN/Creatinine Ratio: 18 (ref 9–20)
BUN: 15 mg/dL (ref 6–24)
Bilirubin Total: 0.8 mg/dL (ref 0.0–1.2)
CO2: 21 mmol/L (ref 20–29)
Calcium: 9.5 mg/dL (ref 8.7–10.2)
Chloride: 104 mmol/L (ref 96–106)
Creatinine, Ser: 0.83 mg/dL (ref 0.76–1.27)
Globulin, Total: 3.6 g/dL (ref 1.5–4.5)
Glucose: 102 mg/dL — ABNORMAL HIGH (ref 70–99)
Potassium: 4 mmol/L (ref 3.5–5.2)
Sodium: 140 mmol/L (ref 134–144)
Total Protein: 8.2 g/dL (ref 6.0–8.5)
eGFR: 101 mL/min/{1.73_m2} (ref >59)

## 2021-06-16 LAB — TSH+FREE T4
Free T4: 1.41 ng/dL (ref 0.82–1.77)
TSH: 0.405 u[IU]/mL — ABNORMAL LOW (ref 0.450–4.500)

## 2021-06-16 LAB — CBC WITH DIFFERENTIAL/PLATELET
Basophils Absolute: 0.1 10*3/uL (ref 0.0–0.2)
Basos: 1 %
EOS (ABSOLUTE): 0.1 10*3/uL (ref 0.0–0.4)
Eos: 1 %
Hematocrit: 44.6 % (ref 37.5–51.0)
Hemoglobin: 15.6 g/dL (ref 13.0–17.7)
Immature Grans (Abs): 0 10*3/uL (ref 0.0–0.1)
Immature Granulocytes: 0 %
Lymphocytes Absolute: 1.8 10*3/uL (ref 0.7–3.1)
Lymphs: 20 %
MCH: 29.8 pg (ref 26.6–33.0)
MCHC: 35 g/dL (ref 31.5–35.7)
MCV: 85 fL (ref 79–97)
Monocytes Absolute: 0.8 10*3/uL (ref 0.1–0.9)
Monocytes: 9 %
Neutrophils Absolute: 6 10*3/uL (ref 1.4–7.0)
Neutrophils: 69 %
Platelets: 228 10*3/uL (ref 150–450)
RBC: 5.24 x10E6/uL (ref 4.14–5.80)
RDW: 12.6 % (ref 11.6–15.4)
WBC: 8.7 10*3/uL (ref 3.4–10.8)

## 2021-06-16 LAB — PROTIME-INR
INR: 1 (ref 0.9–1.2)
Prothrombin Time: 11 s (ref 9.1–12.0)

## 2021-06-16 LAB — LIPID PANEL
Chol/HDL Ratio: 5.3 ratio — ABNORMAL HIGH (ref 0.0–5.0)
Cholesterol, Total: 208 mg/dL — ABNORMAL HIGH (ref 100–199)
HDL: 39 mg/dL — ABNORMAL LOW (ref >39)
LDL Chol Calc (NIH): 149 mg/dL — ABNORMAL HIGH (ref 0–99)
Triglycerides: 112 mg/dL (ref 0–149)
VLDL Cholesterol Cal: 20 mg/dL (ref 5–40)

## 2021-06-16 LAB — HEMOGLOBIN A1C
Est. average glucose Bld gHb Est-mCnc: 111 mg/dL
Hgb A1c MFr Bld: 5.5 % (ref 4.8–5.6)

## 2021-06-16 LAB — HEPATITIS C GENOTYPE

## 2021-06-16 LAB — LYME DISEASE SEROLOGY W/REFLEX: Lyme Total Antibody EIA: NEGATIVE

## 2021-06-16 LAB — HEPATITIS C ANTIBODY: Hep C Virus Ab: REACTIVE — AB

## 2021-06-17 NOTE — Progress Notes (Signed)
Medical Clearance has been received from Merita Norton, FNP. The patient is cleared at Medium risk.  ?

## 2021-07-02 DIAGNOSIS — M791 Myalgia, unspecified site: Secondary | ICD-10-CM | POA: Diagnosis not present

## 2021-07-02 DIAGNOSIS — M545 Low back pain, unspecified: Secondary | ICD-10-CM | POA: Diagnosis not present

## 2021-07-02 DIAGNOSIS — M47817 Spondylosis without myelopathy or radiculopathy, lumbosacral region: Secondary | ICD-10-CM | POA: Diagnosis not present

## 2021-07-02 DIAGNOSIS — Z79899 Other long term (current) drug therapy: Secondary | ICD-10-CM | POA: Diagnosis not present

## 2021-07-08 ENCOUNTER — Ambulatory Visit (INDEPENDENT_AMBULATORY_CARE_PROVIDER_SITE_OTHER): Payer: Medicare Other | Admitting: Surgery

## 2021-07-08 ENCOUNTER — Encounter: Payer: Self-pay | Admitting: Surgery

## 2021-07-08 VITALS — BP 124/79 | HR 71 | Temp 98.0°F | Ht 73.0 in | Wt 332.0 lb

## 2021-07-08 DIAGNOSIS — K5733 Diverticulitis of large intestine without perforation or abscess with bleeding: Secondary | ICD-10-CM | POA: Diagnosis not present

## 2021-07-08 DIAGNOSIS — K5792 Diverticulitis of intestine, part unspecified, without perforation or abscess without bleeding: Secondary | ICD-10-CM

## 2021-07-08 MED ORDER — NEOMYCIN SULFATE 500 MG PO TABS: ORAL_TABLET | ORAL | 0 refills | Status: DC

## 2021-07-08 MED ORDER — BISACODYL 5 MG PO TBEC: DELAYED_RELEASE_TABLET | ORAL | 0 refills | Status: DC

## 2021-07-08 MED ORDER — METRONIDAZOLE 500 MG PO TABS: ORAL_TABLET | ORAL | 0 refills | Status: DC

## 2021-07-08 MED ORDER — POLYETHYLENE GLYCOL 3350 17 GM/SCOOP PO POWD: ORAL | 0 refills | Status: DC

## 2021-07-08 NOTE — Patient Instructions (Signed)
We have discussed removing a portion of your damaged colon through 4 small incisions today. We have scheduled this surgery for 07/23/21 at [facility] with Dr. Dahlia Byes. Please plan a hospital stay of 3-7 days for surgery and recovery time.  We will have you complete a bowel prep prior to your surgery. Please see information provided.  You have also been given a (Blue) Pre-Care Sheet with more information regarding your particular surgery. Our surgery scheduler will call you to verify surgery date and to go over information.  Please review all information given.  You will need to arrange to be out of work for approximately 2 weeks and then you may return with a lifting restriction for 4 more weeks. If you have FMLA or Disability paperwork that needs to be filled out, please have your company fax your paperwork to [phone] or you may drop this by either office. This paperwork will be filled out within 3 days after your surgery has been completed.  Please call our office with any questions or concerns prior to your scheduled surgery.   Laparoscopic Colectomy Laparoscopic colectomy is surgery to remove part or all of the large intestine (colon). This procedure may be used to treat several conditions, including: Inflammation and infection of the colon (diverticulitis). Tumors or masses in the colon. Inflammatory bowel disease, such as Crohn disease or ulcerative colitis. Colectomy is an option when symptoms cannot be controlled with medicines. Bleeding from the colon that cannot be controlled by another method. Blockage or obstruction of the colon.  Tell a health care provider about: Any allergies you have. All medicines you are taking, including vitamins, herbs, eye drops, creams, and over-the-counter medicines. Any problems you or family members have had with anesthetic medicines. Any blood disorders you have. Any surgeries you have had. Any medical conditions you have. What are the  risks? Generally, this is a safe procedure. However, problems may occur, including: Infection. Bleeding. Allergic reactions to medicines or dyes. Damage to other structures or organs. Leaking from where the colon was sewn together. Future blockage of the small intestines from scar tissue. Another surgery may be needed to repair this. Needing to convert to an open procedure. Complications such as damage to other organs or excessive bleeding may require the surgeon to convert from a laparoscopic procedure to an open procedure. This involves making a larger incision in the abdomen.  What happens before the procedure? Staying hydrated Follow instructions from your health care provider about hydration, which may include: Up to 2 hours before the procedure - you may continue to drink clear liquids, such as water, clear fruit juice, black coffee, and plain tea.  Eating and drinking restrictions Follow instructions from your health care provider about eating and drinking, which may include: 8 hours before the procedure - stop eating heavy meals, meals with high fiber, or foods such as meat, fried foods, or fatty foods. 6 hours before the procedure - stop eating light meals or foods, such as toast or cereal. 6 hours before the procedure - stop drinking milk or drinks that contain milk. 2 hours before the procedure - stop drinking clear liquids.  Medicines Ask your health care provider about: Changing or stopping your regular medicines. This is especially important if you are taking diabetes medicines or blood thinners. Taking medicines such as aspirin and ibuprofen. These medicines can thin your blood. Do not take these medicines before your procedure if your health care provider instructs you not to. You may be given  antibiotic medicine to clean out bacteria from your colon. Follow the directions carefully and take the medicine at the correct time. General instructions You may be prescribed an  oral bowel prep to clean out your colon in preparation for the surgery: Follow instructions from your health care provider about how to do this. Do not eat or drink anything else after you have started the bowel prep, unless your health care provider tells you it is safe to do so. Do not use any products that contain nicotine or tobacco, such as cigarettes and e-cigarettes. If you need help quitting, ask your health care provider. What happens during the procedure? To reduce your risk of infection: Your health care team will wash or sanitize their hands. Your skin will be washed with soap. An IV tube will be inserted into one of your veins to deliver fluid and medication. You will be given one of the following: A medicine to help you relax (sedative). A medicine to make you fall asleep (general anesthetic). Small monitors will be connected to your body. They will be used to check your heart, blood pressure, and oxygen level. A breathing tube may be placed into your lungs during the procedure. A thin, flexible tube (catheter) will be placed into your bladder to drain urine. A tube may be placed through your nose and into your stomach to drain stomach fluids (nasogastric tube, or NG tube). Your abdomen will be filled with air so it expands. This gives the surgeon more room to operate and makes your organs easier to see. Several small cuts (incisions) will be made in your abdomen. A thin, lighted tube with a tiny camera on the end (laparoscope) will be put through one of the small incisions. The camera on the laparoscope will send a picture to a computer screen in the operating room. This will give the surgeon a good view inside your abdomen. Hollow tubes will be put through the other small incisions in your abdomen. The tools that are needed for the procedure will be put through these tubes. Clamps or staples will be put on both ends of the diseased part of the colon. The part of the intestine  between the clamps or staples will be removed. If possible, the ends of the healthy colon that remain will be stitched (sutured) or stapled together to allow your body to pass waste (stool). Sometimes, the remaining colon cannot be stitched back together. If this is the case, a colostomy will be needed. If you need a colostomy: An opening to the outside of your body (stoma) will be made through your abdomen. The end of your colon will be brought to the opening. It will be stitched to the skin. A bag will be attached to the opening. Stool will drain into this removable bag. The colostomy may be temporary or permanent. The incisions from the colectomy will be closed with sutures or staples. The procedure may vary among health care providers and hospitals. What happens after the procedure? Your blood pressure, heart rate, breathing rate, and blood oxygen level will be monitored until the medicines you were given have worn off. You will receive fluids through an IV tube until your bowels start to work properly. Once your bowels are working again, you will be given clear liquids first and then solid food as tolerated. You will be given medicines to control your pain and nausea, if needed. Do not drive for 24 hours if you were given a sedative. This information is not  intended to replace advice given to you by your health care provider. Make sure you discuss any questions you have with your health care provider. Document Released: 04/26/2002 Document Revised: 11/05/2015 Document Reviewed: 11/05/2015 Elsevier Interactive Patient Education  2018 [employer].     Laparoscopic Colectomy, Care After This sheet gives you information about how to care for yourself after your procedure. Your health care provider may also give you more specific instructions. If you have problems or questions, contact your health care provider. What can I expect after the procedure? After your procedure, it is common to  have the following: Pain in your abdomen, especially in the incision areas. You will be given medicine to control the pain. Tiredness. This is a normal part of the recovery process. Your energy level will return to normal over the next several weeks. Changes in your bowel movements, such as constipation or needing to go more often. Talk with your health care provider about how to manage this.  Follow these instructions at home: Medicines Take over-the-counter and prescription medicines only as told by your health care provider. Do not drive or use heavy machinery while taking prescription pain medicine. Do not drink alcohol while taking prescription pain medicine. If you were prescribed an antibiotic medicine, use it as told by your health care provider. Do not stop using the antibiotic even if you start to feel better. Incision care Follow instructions from your health care provider about how to take care of your incision areas. Make sure you: Keep your incisions clean and dry. Wash your hands with soap and water before and after applying medicine to the areas, and before and after changing your bandage (dressing). If soap and water are not available, use hand sanitizer. Change your dressing as told by your health care provider. Leave stitches (sutures), skin glue, or adhesive strips in place. These skin closures may need to stay in place for 2 weeks or longer. If adhesive strip edges start to loosen and curl up, you may trim the loose edges. Do not remove adhesive strips completely unless your health care provider tells you to do that. Do not wear tight clothing over the incisions. Tight clothing may rub and irritate the incision areas, which may cause the incisions to open. Do not take baths, swim, or use a hot tub until your health care provider approves. Ask your health care provider if you can take showers. You may only be allowed to take sponge baths for bathing. Check your incision area  every day for signs of infection. Check for: More redness, swelling, or pain. More fluid or blood. Warmth. Pus or a bad smell. Activity Avoid lifting anything that is heavier than 10 lb (4.5 kg) for 2 weeks or until your health care provider says it is okay. You may resume normal activities as told by your health care provider. Ask your health care provider what activities are safe for you. Take rest breaks during the day as needed. Eating and drinking Follow instructions from your health care provider about what you can eat after surgery. To prevent or treat constipation while you are taking prescription pain medicine, your health care provider may recommend that you: Drink enough fluid to keep your urine clear or pale yellow. Take over-the-counter or prescription medicines. Eat foods that are high in fiber, such as fresh fruits and vegetables, whole grains, and beans. Limit foods that are high in fat and processed sugars, such as fried and sweet foods. General instructions Ask your  health care provider when you will need an appointment to get your sutures or staples removed. Keep all follow-up visits as told by your health care provider. This is important. Contact a health care provider if: You have more redness, swelling, or pain around your incisions. You have more fluid or blood coming from the incisions. Your incisions feel warm to the touch. You have pus or a bad smell coming from your incisions or your dressing. You have a fever. You have an incision that breaks open (edges not staying together) after sutures or staples have been removed. Get help right away if: You develop a rash. You have chest pain or difficulty breathing. You have pain or swelling in your legs. You feel light-headed or you faint. Your abdomen swells (becomes distended). You have nausea or vomiting. You have blood in your stool (feces). This information is not intended to replace advice given to you by  your health care provider. Make sure you discuss any questions you have with your health care provider. Document Released: 08/23/2004 Document Revised: 11/05/2015 Document Reviewed: 11/05/2015 Elsevier Interactive Patient Education  [employer].

## 2021-07-09 ENCOUNTER — Other Ambulatory Visit: Payer: Self-pay

## 2021-07-09 ENCOUNTER — Emergency Department: Payer: Medicare Other

## 2021-07-09 ENCOUNTER — Emergency Department
Admission: EM | Admit: 2021-07-09 | Discharge: 2021-07-09 | Disposition: A | Discharge status: Home / Self Care | Payer: Medicare Other | Attending: Emergency Medicine | Admitting: Emergency Medicine

## 2021-07-09 ENCOUNTER — Encounter: Payer: Self-pay | Admitting: Intensive Care

## 2021-07-09 DIAGNOSIS — M542 Cervicalgia: Secondary | ICD-10-CM | POA: Insufficient documentation

## 2021-07-09 DIAGNOSIS — W010XXA Fall on same level from slipping, tripping and stumbling without subsequent striking against object, initial encounter: Secondary | ICD-10-CM | POA: Diagnosis not present

## 2021-07-09 DIAGNOSIS — Z043 Encounter for examination and observation following other accident: Secondary | ICD-10-CM | POA: Diagnosis not present

## 2021-07-09 DIAGNOSIS — W19XXXA Unspecified fall, initial encounter: Secondary | ICD-10-CM

## 2021-07-09 DIAGNOSIS — M25511 Pain in right shoulder: Secondary | ICD-10-CM | POA: Diagnosis not present

## 2021-07-09 HISTORY — DX: Gastro-esophageal reflux disease without esophagitis: K21.9

## 2021-07-09 HISTORY — DX: Pure hypercholesterolemia, unspecified: E78.00

## 2021-07-09 MED ORDER — LIDOCAINE 5 % EX PTCH: 1.0000 | MEDICATED_PATCH | Freq: Two times a day (BID) | CUTANEOUS | 0 refills | Status: DC

## 2021-07-09 MED ORDER — OXYCODONE HCL 5 MG PO TABS
5.0000 mg | ORAL_TABLET | Freq: Once | ORAL | Status: AC
  Administered 2021-07-09: 5 mg via ORAL
  Filled 2021-07-09: qty 1

## 2021-07-09 NOTE — ED Provider Notes (Signed)
[facility] Provider Note    Event Date/Time   First MD Initiated Contact with Patient 07/09/21 1148     (approximate)   History   Chief Complaint Fall and Shoulder Pain   HPI  [name] [name] is a 61 y.o. male with past medical history of hyperlipidemia, GERD, and fibromyalgia who presents to the ED complaining of shoulder pain.  Patient reports that 2 days ago he tripped while he was cutting a tree branch, falling onto his right shoulder.  He denies hitting his head or losing consciousness, states he has been dealing with significant pain in his right shoulder and right side of his neck since the fall.  He denies any difficulty moving the arm and has not had any pain in his elbow or wrist.  He denies any numbness or weakness in the arm.     Physical Exam   Triage Vital Signs: ED Triage Vitals  Enc Vitals Group     BP 07/09/21 1128 131/73     Pulse Rate 07/09/21 1128 72     Resp 07/09/21 1128 18     Temp 07/09/21 1128 98.1 F (36.7 C)     Temp Source 07/09/21 1128 Oral     SpO2 07/09/21 1128 95 %     Weight 07/09/21 1129 (!) 332 lb (150.6 kg)     Height 07/09/21 1129 6\' 1"  (1.854 m)     Head Circumference --      Peak Flow --      Pain Score 07/09/21 1129 8     Pain Loc --      Pain Edu? --      Excl. in GC? --     Most recent vital signs: Vitals:   07/09/21 1128  BP: 131/73  Pulse: 72  Resp: 18  Temp: 98.1 F (36.7 C)  SpO2: 95%    Constitutional: Alert and oriented. Eyes: Conjunctivae are normal. Head: Atraumatic. Nose: No congestion/rhinnorhea. Mouth/Throat: Mucous membranes are moist.  Neck: No midline cervical spine tenderness to palpation noted.  Tenderness to palpation noted over right trapezius. Cardiovascular: Normal rate, regular rhythm. Grossly normal heart sounds.  2+ radial pulses bilaterally. Respiratory: Normal respiratory effort.  No retractions. Lungs CTAB. Gastrointestinal: Soft and nontender. No  distention. Musculoskeletal: No lower extremity tenderness nor edema.  Diffuse tenderness to palpation over right shoulder with no obvious deformity. Neurologic:  Normal speech and language. No gross focal neurologic deficits are appreciated.    ED Results / Procedures / Treatments   Labs (all labs ordered are listed, but only abnormal results are displayed) Labs Reviewed - No data to display  RADIOLOGY X-rays of right shoulder reviewed and interpreted by me with no fracture or dislocation.  PROCEDURES:  Critical Care performed: No  Procedures   MEDICATIONS ORDERED IN ED: Medications  oxyCODONE (Oxy IR/ROXICODONE) immediate release tablet 5 mg (has no administration in time range)     IMPRESSION / MDM / ASSESSMENT AND PLAN / ED COURSE  I reviewed the triage vital signs and the nursing notes.                              61 y.o. male with past medical history of hyperlipidemia, GERD, and fibromyalgia who presents to the ED complaining of pain in his right shoulder and trapezius area after tripping and falling 2 days ago.  Differential diagnosis includes, but is not limited to, humeral fracture, shoulder  dislocation, AC injury, trapezius strain.  Patient well-appearing and in no acute distress, vital signs unremarkable and he is neurovascular intact to his distal right upper extremity.  X-ray of right shoulder is unremarkable with no evidence of fracture, dislocation, or AC injury.  Patient does have some tenderness over his trapezius but no midline cervical spine tenderness concerning for cervical injury.  He is appropriate for discharge home with outpatient follow-up, states he cannot take Tylenol or ibuprofen, has already been taking muscle relaxants at home.  We will prescribe Lidoderm patches and have patient follow-up with his PCP, was counseled to return to the ED for new worsening symptoms.      FINAL CLINICAL IMPRESSION(S) / ED DIAGNOSES   Final diagnoses:  Fall,  initial encounter  Acute pain of right shoulder     Rx / DC Orders   ED Discharge Orders          Ordered    lidocaine (LIDODERM) 5 %  Every 12 hours        07/09/21 1252             Note:  This document was prepared using Dragon voice recognition software and may include unintentional dictation errors.   Chesley Noon, MD 07/09/21 1302

## 2021-07-09 NOTE — ED Triage Notes (Signed)
Patient reports falling X2 days ago onto right shoulder. Now experiencing pains in shoulder.

## 2021-07-09 NOTE — ED Notes (Signed)
Pt reports falling 2 days ago. He tripped over a branch and landed on his right shoulder. Complains of right shoulder pain. States the pain is a shooting pain but unsure of where the shooting pain starts and stops. Pt states yesterday it did not hurt much but this morning he is unable to completely turn his head to the right.

## 2021-07-09 NOTE — Progress Notes (Signed)
Outpatient Surgical Follow Up  07/09/2021  [name] [name] is an 61 y.o. male.   Chief Complaint  Patient presents with   Pre-op Exam    HPI: 61 year old male well-known to me with at least 3 episodes of diverticulitis the last one requiring hospitalization. Last episode was a contained perforation 2022.  No need for percutaneous drainage.  No fevers no chills.  He does have a BMI of 43 now after loosing weight.  He does have a history of chronic pain. Did have a colonoscopy that have personally reviewed showing evidence of some diverticulitis and diverticulosis . HE Does have a history of hepatitis C.  No prior abdominal operations. He Is able to perform more than 4 METS of activity without any shortness of breath or chest pain.  He does use a cane for balance and due to his back condition.  He does still endorses intermittent suprapubic and left lower quadrant pain. Last CT did not show evidence of diverticulitis but last colonoscopy did show some colitis in the sigmoid colon  Past Medical History:  Diagnosis Date   Acid reflux    Anxiety    Arthritis    Depression    Diverticulitis    Fibromyalgia    GERD (gastroesophageal reflux disease)    Glaucoma    Hepatitis C    High cholesterol    Hypothyroidism    Juvenile epilepsy (HCC)    Positive TB test    Sleep apnea    Uses CPap machine    Past Surgical History:  Procedure Laterality Date   COLONOSCOPY WITH PROPOFOL N/A 02/01/2021   Procedure: COLONOSCOPY WITH PROPOFOL;  Surgeon: Regis Bill, MD;  Location: [facility] ENDOSCOPY;  Service: Endoscopy;  Laterality: N/A;   ESOPHAGOGASTRODUODENOSCOPY (EGD) WITH PROPOFOL N/A 02/01/2021   Procedure: ESOPHAGOGASTRODUODENOSCOPY (EGD) WITH PROPOFOL;  Surgeon: Regis Bill, MD;  Location: [facility] ENDOSCOPY;  Service: Endoscopy;  Laterality: N/A;   LIVER BIOPSY      Family History  Problem Relation Age of Onset   Aneurysm Mother    Cancer Father    Lung cancer Father    Mental  illness Sister    Mental illness Sister    Brain cancer Maternal Grandmother    Emphysema Maternal Grandfather    Drug abuse Maternal Uncle    Drug abuse Paternal Aunt     Social History:  reports that he has never smoked. He has been exposed to tobacco smoke. He has never used smokeless tobacco. He reports that he does not currently use alcohol. He reports that he does not currently use drugs after having used the following drugs: Marijuana.  Allergies:  Allergies  Allergen Reactions   Tylenol [Acetaminophen] Other (See Comments)    Was told not to take d/t having hep c treatment   Celecoxib Anxiety   Zoloft [Sertraline Hcl] Anxiety    Medications reviewed.    ROS Full ROS performed and is otherwise negative other than what is stated in HPI   BP 124/79   Pulse 71   Temp 98 F (36.7 C)   Ht 6\' 1"  (1.854 m)   Wt (!) 332 lb (150.6 kg)   SpO2 95%   BMI 43.80 kg/m   Physical Exam Vitals and nursing note reviewed. Exam conducted with a chaperone present.  Constitutional:      General: He is not in acute distress.    Appearance: Normal appearance. He is obese. He is not ill-appearing or toxic-appearing.  Eyes:  General: No scleral icterus.       Right eye: No discharge.        Left eye: No discharge.  Cardiovascular:     Rate and Rhythm: Normal rate and regular rhythm.     Heart sounds: No murmur heard. Pulmonary:     Effort: Pulmonary effort is normal. No respiratory distress.     Breath sounds: Normal breath sounds. No stridor. No wheezing.  Abdominal:     General: Abdomen is flat. There is no distension.     Palpations: Abdomen is soft. There is no mass.     Tenderness: There is abdominal tenderness. There is no guarding.     Hernia: No hernia is present.     Comments: LLQ tenderness, no rebound  Musculoskeletal:        General: No swelling or tenderness. Normal range of motion.     Cervical back: Normal range of motion and neck supple. No rigidity or  tenderness.  Lymphadenopathy:     Cervical: No cervical adenopathy.  Skin:    General: Skin is warm and dry.     Capillary Refill: Capillary refill takes less than 2 seconds.  Neurological:     Mental Status: He is alert.  Psychiatric:        Mood and Affect: Mood normal.        Behavior: Behavior normal.        Thought Content: Thought content normal.        Judgment: Judgment normal.    Assessment/Plan: 61 year old male with history of recurrent complicated diverticulitis with prior abscess and contained perforation.  Currently not toxic and does not require hospitalization or intervention at this time. I Had an extensive discussion with patient and wife  regarding the role for elective sigmoid colectomy.  I do think that we will need to perform thisand now they are ready Unfortunately no good answers regarding management of chronic pain.  He does have episode of the recurrent diverticulitis and evidence of prior contained perforation.  I was very candid with him and discussed with him that there is definitely a role for sigmoid colectomy however I cannot guarantee that the sigmoid colectomy will eliminate all his chronic pain issues. He asked me today about potentially given narcotics and I was very candid with them today that I would not provide any narcotics but after he completes the colectomy. Her to seek primary care physician for further chronic pain issues She had discussed with him and his wife.  Risks, benefits and possible complications including but not limited to: Bleeding, infection anastomotic leak, recurrences, chronic pain and the need for ostomy.  They understand and wish to proceed I spent 40 minutes in this encounter including personally reviewing all imaging studies, medical records, counseling the patient and the wife, placing orders and performing appropriate documentation  Caroleen Hamman, MD [facility] General Surgeon

## 2021-07-09 NOTE — H&P (View-Only) (Signed)
Outpatient Surgical Follow Up  07/09/2021  [name] [name] is an 60 y.o. male.   Chief Complaint  Patient presents with   Pre-op Exam    HPI: 60-year-old male well-known to me with at least 3 episodes of diverticulitis the last one requiring hospitalization. Last episode was a contained perforation 2022.  No need for percutaneous drainage.  No fevers no chills.  He does have a BMI of 43 now after loosing weight.  He does have a history of chronic pain. Did have a colonoscopy that have personally reviewed showing evidence of some diverticulitis and diverticulosis . HE Does have a history of hepatitis C.  No prior abdominal operations. He Is able to perform more than 4 METS of activity without any shortness of breath or chest pain.  He does use a cane for balance and due to his back condition.  He does still endorses intermittent suprapubic and left lower quadrant pain. Last CT did not show evidence of diverticulitis but last colonoscopy did show some colitis in the sigmoid colon  Past Medical History:  Diagnosis Date   Acid reflux    Anxiety    Arthritis    Depression    Diverticulitis    Fibromyalgia    GERD (gastroesophageal reflux disease)    Glaucoma    Hepatitis C    High cholesterol    Hypothyroidism    Juvenile epilepsy (HCC)    Positive TB test    Sleep apnea    Uses CPap machine    Past Surgical History:  Procedure Laterality Date   COLONOSCOPY WITH PROPOFOL N/A 02/01/2021   Procedure: COLONOSCOPY WITH PROPOFOL;  Surgeon: Locklear, Cameron T, MD;  Location: [facility] ENDOSCOPY;  Service: Endoscopy;  Laterality: N/A;   ESOPHAGOGASTRODUODENOSCOPY (EGD) WITH PROPOFOL N/A 02/01/2021   Procedure: ESOPHAGOGASTRODUODENOSCOPY (EGD) WITH PROPOFOL;  Surgeon: Locklear, Cameron T, MD;  Location: [facility] ENDOSCOPY;  Service: Endoscopy;  Laterality: N/A;   LIVER BIOPSY      Family History  Problem Relation Age of Onset   Aneurysm Mother    Cancer Father    Lung cancer Father    Mental  illness Sister    Mental illness Sister    Brain cancer Maternal Grandmother    Emphysema Maternal Grandfather    Drug abuse Maternal Uncle    Drug abuse Paternal Aunt     Social History:  reports that he has never smoked. He has been exposed to tobacco smoke. He has never used smokeless tobacco. He reports that he does not currently use alcohol. He reports that he does not currently use drugs after having used the following drugs: Marijuana.  Allergies:  Allergies  Allergen Reactions   Tylenol [Acetaminophen] Other (See Comments)    Was told not to take d/t having hep c treatment   Celecoxib Anxiety   Zoloft [Sertraline Hcl] Anxiety    Medications reviewed.    ROS Full ROS performed and is otherwise negative other than what is stated in HPI   BP 124/79   Pulse 71   Temp 98 F (36.7 C)   Ht 6' 1" (1.854 m)   Wt (!) 332 lb (150.6 kg)   SpO2 95%   BMI 43.80 kg/m   Physical Exam Vitals and nursing note reviewed. Exam conducted with a chaperone present.  Constitutional:      General: He is not in acute distress.    Appearance: Normal appearance. He is obese. He is not ill-appearing or toxic-appearing.  Eyes:       General: No scleral icterus.       Right eye: No discharge.        Left eye: No discharge.  Cardiovascular:     Rate and Rhythm: Normal rate and regular rhythm.     Heart sounds: No murmur heard. Pulmonary:     Effort: Pulmonary effort is normal. No respiratory distress.     Breath sounds: Normal breath sounds. No stridor. No wheezing.  Abdominal:     General: Abdomen is flat. There is no distension.     Palpations: Abdomen is soft. There is no mass.     Tenderness: There is abdominal tenderness. There is no guarding.     Hernia: No hernia is present.     Comments: LLQ tenderness, no rebound  Musculoskeletal:        General: No swelling or tenderness. Normal range of motion.     Cervical back: Normal range of motion and neck supple. No rigidity or  tenderness.  Lymphadenopathy:     Cervical: No cervical adenopathy.  Skin:    General: Skin is warm and dry.     Capillary Refill: Capillary refill takes less than 2 seconds.  Neurological:     Mental Status: He is alert.  Psychiatric:        Mood and Affect: Mood normal.        Behavior: Behavior normal.        Thought Content: Thought content normal.        Judgment: Judgment normal.    Assessment/Plan: 61 year old male with history of recurrent complicated diverticulitis with prior abscess and contained perforation.  Currently not toxic and does not require hospitalization or intervention at this time. I Had an extensive discussion with patient and wife  regarding the role for elective sigmoid colectomy.  I do think that we will need to perform thisand now they are ready Unfortunately no good answers regarding management of chronic pain.  He does have episode of the recurrent diverticulitis and evidence of prior contained perforation.  I was very candid with him and discussed with him that there is definitely a role for sigmoid colectomy however I cannot guarantee that the sigmoid colectomy will eliminate all his chronic pain issues. He asked me today about potentially given narcotics and I was very candid with them today that I would not provide any narcotics but after he completes the colectomy. Her to seek primary care physician for further chronic pain issues She had discussed with him and his wife.  Risks, benefits and possible complications including but not limited to: Bleeding, infection anastomotic leak, recurrences, chronic pain and the need for ostomy.  They understand and wish to proceed I spent 40 minutes in this encounter including personally reviewing all imaging studies, medical records, counseling the patient and the wife, placing orders and performing appropriate documentation  Caroleen Hamman, MD [facility] General Surgeon

## 2021-07-11 ENCOUNTER — Telehealth: Payer: Self-pay | Admitting: Family Medicine

## 2021-07-11 NOTE — Telephone Encounter (Signed)
Copied from CRM [id number]. Topic: Medicare AWV >> Jul 11, 2021  1:18 PM Claudette Laws R wrote: Reason for CRM:  Left message for patient to call back and schedule Medicare Annual Wellness Visit (AWV) in office.   If unable to come into the office for AWV,  please offer to do virtually or by telephone.  Last AWV: 01/19/2020  Please schedule at anytime with [facility] Health Advisor.  30 minute appointment for Virtual or phone 45 minute appointment for in office or Initial virtual/phone  Any questions, please contact me at [phone]

## 2021-07-12 ENCOUNTER — Inpatient Hospital Stay: Admission: RE | Admit: 2021-07-12 | Payer: Medicaid Other | Source: Ambulatory Visit

## 2021-07-17 ENCOUNTER — Encounter: Payer: Self-pay | Admitting: Internal Medicine

## 2021-07-17 ENCOUNTER — Encounter
Admission: RE | Admit: 2021-07-17 | Discharge: 2021-07-17 | Disposition: A | Discharge status: Home / Self Care | Payer: Medicare Other | Source: Ambulatory Visit | Attending: Surgery | Admitting: Surgery

## 2021-07-17 ENCOUNTER — Ambulatory Visit (INDEPENDENT_AMBULATORY_CARE_PROVIDER_SITE_OTHER): Payer: Medicare Other | Admitting: Internal Medicine

## 2021-07-17 VITALS — BP 120/74 | HR 83 | Ht 73.0 in | Wt 332.0 lb

## 2021-07-17 DIAGNOSIS — I5189 Other ill-defined heart diseases: Secondary | ICD-10-CM | POA: Diagnosis not present

## 2021-07-17 DIAGNOSIS — E7800 Pure hypercholesterolemia, unspecified: Secondary | ICD-10-CM

## 2021-07-17 DIAGNOSIS — Z01812 Encounter for preprocedural laboratory examination: Secondary | ICD-10-CM

## 2021-07-17 DIAGNOSIS — Z01810 Encounter for preprocedural cardiovascular examination: Secondary | ICD-10-CM

## 2021-07-17 HISTORY — DX: Carpal tunnel syndrome, bilateral upper limbs: G56.03

## 2021-07-17 HISTORY — DX: Personal history of urinary calculi: Z87.442

## 2021-07-17 HISTORY — DX: Dyspnea, unspecified: R06.00

## 2021-07-17 NOTE — Progress Notes (Signed)
New Outpatient Visit Date: 07/17/2021  Referring Provider: Jacky Kindle, FNP [address] [place],  [place] [address]  Chief Complaint: Preop evaluation  HPI:  [name] [name] is a 61 y.o. male who is being seen today for preoperative cardiovascular risk assessment in anticipation of sigmoid colectomy for management of diverticulitis at the request of Ms. Suzie Portela. He has a history of hyperlipidemia, hepatitis C, hypothyroidism, obstructive sleep apnea, seizures as a child/young adult (last seizure in 1987 or 1988) diverticulitis, depression, anxiety, GERD, and arthritis.  He is scheduled for sigmoid colectomy with Dr. [name] on 07/23/2021.  He saw Ms. Suzie Portela in late April to establish care as well as for cardiovascular risk assessment.  He was referred to [place] because of a questionable history of diastolic heart failure.  Today, [name] [name] reports that he is feeling fairly well.  He tripped over a tree branch on the ground a little over a week ago and fell onto his right side.  He had pain in his neck and right shoulder that lasted for about a week.  ED evaluation did not show any evidence of fracture.  Pain has since resolved.  Other than that, he has not had any chest pain.  He notes occasional flutters or skipped beats in his chest without associated symptoms.  These seem to go along with when he is feeling anxious.  He has stable exertional dyspnea but no orthopnea or edema.  He is compliant with CPAP.  He has chronic back pain.  --------------------------------------------------------------------------------------------------  Cardiovascular History & Procedures: Cardiovascular Problems: Diastolic dysfunction  Risk Factors: Hyperlipidemia, well gender, morbid obesity, and age greater than 17  Cath/PCI: None  CV Surgery: None  EP Procedures and Devices: None  Non-Invasive Evaluation(s): TTE (01/02/2020, [facility]): Normal LV size with moderate LVH.  LVEF 55-60% with diastolic dysfunction  (details not available).  Normal RV size and function.  Moderate left atrial enlargement.  Trace mitral and tricuspid regurgitation.  Normal aortic valve.  Borderline dilated aortic root (3.9 cm).  Recent CV Pertinent Labs: Lab Results  Component Value Date   CHOL 208 (H) 06/12/2021   HDL 39 (L) 06/12/2021   LDLCALC 149 (H) 06/12/2021   TRIG 112 06/12/2021   CHOLHDL 5.3 (H) 06/12/2021   INR 1.0 06/12/2021   K 4.0 06/12/2021   BUN 15 06/12/2021   CREATININE 0.83 06/12/2021    --------------------------------------------------------------------------------------------------  Past Medical History:  Diagnosis Date   Acid reflux    Anxiety    Arthritis    Depression    Diverticulitis    Fibromyalgia    GERD (gastroesophageal reflux disease)    Glaucoma    Hepatitis C    High cholesterol    Hypothyroidism    Juvenile epilepsy (HCC)    Positive TB test    Sleep apnea    Uses CPap machine    Past Surgical History:  Procedure Laterality Date   COLONOSCOPY WITH PROPOFOL N/A 02/01/2021   Procedure: COLONOSCOPY WITH PROPOFOL;  Surgeon: Regis Bill, MD;  Location: [facility] ENDOSCOPY;  Service: Endoscopy;  Laterality: N/A;   ESOPHAGOGASTRODUODENOSCOPY (EGD) WITH PROPOFOL N/A 02/01/2021   Procedure: ESOPHAGOGASTRODUODENOSCOPY (EGD) WITH PROPOFOL;  Surgeon: Regis Bill, MD;  Location: [facility] ENDOSCOPY;  Service: Endoscopy;  Laterality: N/A;   LIVER BIOPSY      Current Meds  Medication Sig   bisacodyl (DULCOLAX) 5 MG EC tablet Take all 4 tablets at 8 am the morning prior to your surgery.   levothyroxine (SYNTHROID) 50 MCG  tablet Take 1 tablet (50 mcg total) by mouth daily.   lidocaine (LIDODERM) 5 % Place 1 patch onto the skin every 12 (twelve) hours. Remove & Discard patch within 12 hours or as directed by MD   metroNIDAZOLE (FLAGYL) 500 MG tablet Take 2 tablets at 8AM, take 2 tablets at [facility]2PM, and take 2 tablets at 8PM the day prior to your surgery   neomycin (MYCIFRADIN)  500 MG tablet Take 2 tablet at 8am, take 2 tablets at 2pm, and take 2 tablets at 8pm the day prior to your surgery   NON FORMULARY cpap device   polyethylene glycol powder (MIRALAX) 17 GM/SCOOP powder Mix full container in 64 ounces of Gatorade or other clear liquid. NO RED   rosuvastatin (CRESTOR) 10 MG tablet Take 1 tablet (10 mg total) by mouth daily.   tiZANidine (ZANAFLEX) 4 MG tablet Take 4 mg by mouth every 6 (six) hours as needed for muscle spasms.    Allergies: Tylenol [acetaminophen], Celecoxib, and Zoloft [sertraline hcl]  Social History   Tobacco Use   Smoking status: Never    Passive exposure: Current   Smokeless tobacco: Never  Vaping Use   Vaping Use: Never used  Substance Use Topics   Alcohol use: Not Currently   Drug use: Yes    Types: Marijuana    Comment: occasional    Family History  Problem Relation Age of Onset   Aneurysm Mother    Cancer Father    Lung cancer Father    Mental illness Sister    Mental illness Sister    Brain cancer Maternal Grandmother    Emphysema Maternal Grandfather    Drug abuse Maternal Uncle    Drug abuse Paternal Aunt     Review of Systems: A 12-system review of systems was performed and was negative except as noted in the HPI.  --------------------------------------------------------------------------------------------------  Physical Exam: BP 120/74 (BP Location: Right Arm, Patient Position: Sitting, Cuff Size: Large)   Pulse 83   Ht 6\' 1"  (1.854 m)   Wt (!) 332 lb (150.6 kg)   SpO2 98%   BMI 43.80 kg/m   General: NAD.  Accompanied by his wife. HEENT: No conjunctival pallor or scleral icterus. Facemask in place. Neck: Supple without lymphadenopathy, thyromegaly, JVD, or HJR. No carotid bruit. Lungs: Normal work of breathing. Clear to auscultation bilaterally without wheezes or crackles. Heart: Regular rate and rhythm without murmurs, rubs, or gallops. Non-displaced PMI. Abd: Bowel sounds present. Soft, NT/ND  without hepatosplenomegaly Ext: No lower extremity edema. Radial, PT, and DP pulses are 2+ bilaterally Skin: Warm and dry without rash. Neuro: CNIII-XII intact. Strength and fine-touch sensation intact in upper and lower extremities bilaterally. Psych: Normal mood and affect.  I personally observed [name] [name] climb 2 flights of stairs without chest pain or shortness of breath.  EKG: Normal sinus rhythm with isolated PVC and left anterior fascicular block.  PVC is new since 06/12/2021.  Otherwise, no significant interval change.  Lab Results  Component Value Date   WBC 8.7 06/12/2021   HGB 15.6 06/12/2021   HCT 44.6 06/12/2021   MCV 85 06/12/2021   PLT 228 06/12/2021    Lab Results  Component Value Date   NA 140 06/12/2021   K 4.0 06/12/2021   CL 104 06/12/2021   CO2 21 06/12/2021   BUN 15 06/12/2021   CREATININE 0.83 06/12/2021   GLUCOSE 102 (H) 06/12/2021   ALT 34 06/12/2021    Lab Results  Component Value Date  CHOL 208 (H) 06/12/2021   HDL 39 (L) 06/12/2021   LDLCALC 149 (H) 06/12/2021   TRIG 112 06/12/2021   CHOLHDL 5.3 (H) 06/12/2021    --------------------------------------------------------------------------------------------------  ASSESSMENT AND PLAN: Diastolic dysfunction: Incidentally noted on echocardiogram in 2021 in the setting of shortness of breath.  Dyspnea has since resolved.  [name] [name] appears grossly euvolemic on examination today with NYHA class II symptoms.  His blood pressure is well controlled today.  We have agreed to defer additional testing/intervention at this time.  I encouraged [name] [name] to remain compliant with his CPAP as well as to work on weight loss.  Hyperlipidemia: Lipid panel last month notable for LDL of 149.  10-year cardiovascular risk is 8.9%, placing patient in the intermediate risk category.  Moderate intensity statin therapy could be considered in the future.  Morbid obesity: BMI greater than 40.  Weight loss encouraged  through diet and exercise.  Preoperative cardiovascular risk assessment: [name] [name] does not have any signs or symptoms of unstable cardiac disease.  He is able to complete at least 4 METS of activity without angina.  I think it is reasonable for him to proceed with planned sigmoidectomy without additional cardiac testing/intervention, as this would not further mitigate his perioperative risk for this intermediate risk procedure.  He is not on any anticoagulation/antiplatelet therapy that would need to be held prior to the procedure.  Follow-up: Return to clinic in 6 months  [name] Kendall, MD 07/17/2021 3:13 PM

## 2021-07-17 NOTE — Patient Instructions (Signed)
Medication Instructions:   Your physician recommends that you continue on your current medications as directed. Please refer to the Current Medication list given to you today.  *If you need a refill on your cardiac medications before your next appointment, please call your pharmacy*   Lab Work:  None ordered  Testing/Procedures:  None ordered   Follow-Up: At [facility], you and your health needs are our priority.  As part of our continuing mission to provide you with exceptional heart care, we have created designated Provider Care Teams.  These Care Teams include your primary Cardiologist (physician) and Advanced Practice Providers (APPs -  Physician Assistants and Nurse Practitioners) who all work together to provide you with the care you need, when you need it.  We recommend signing up for the patient portal called "MyChart".  Sign up information is provided on this After Visit Summary.  MyChart is used to connect with patients for Virtual Visits (Telemedicine).  Patients are able to view lab/test results, encounter notes, upcoming appointments, etc.  Non-urgent messages can be sent to your provider as well.   To learn more about what you can do with MyChart, go to [email or web address].    Your next appointment:   6 month(s)  The format for your next appointment:   In Person  Provider:   You may see Dr. [name] or one of the following Advanced Practice Providers on your designated Care Team:   [name], NP [name], PA-C [name], PA-C{   Important Information About Sugar       

## 2021-07-17 NOTE — Patient Instructions (Addendum)
Your procedure is scheduled on: 07/23/21 - Tuesday Report to the Registration Desk on the 1st floor of the [facility]. To find out your arrival time, please call [phone] between 1PM - 3PM on: 07/22/21 - Monday If your arrival time is 6:00 am, do not arrive prior to that time as the [facility] entrance doors do not open until 6:00 am.  REMEMBER: Instructions that are not followed completely may result in serious medical risk, up to and including death; or upon the discretion of your surgeon and anesthesiologist your surgery may need to be rescheduled.  Follow Bowel Prep Instructions given to you by Dr. Hurman Horn office.  TAKE THESE MEDICATIONS THE MORNING OF SURGERY WITH A SIP OF WATER:  - levothyroxine (SYNTHROID) 50 MCG tablet  One week prior to surgery: Stop Anti-inflammatories (NSAIDS) such as Advil, Aleve, Ibuprofen, Motrin, Naproxen, Naprosyn and Aspirin based products such as Excedrin, Goodys Powder, BC Powder.  Stop ANY OVER THE COUNTER supplements until after surgery.  No Alcohol for 24 hours before or after surgery.  No Smoking including e-cigarettes for 24 hours prior to surgery.  No chewable tobacco products for at least 6 hours prior to surgery.  No nicotine patches on the day of surgery.  Do not use any "recreational" drugs for at least a week prior to your surgery.  Please be advised that the combination of cocaine and anesthesia may have negative outcomes, up to and including death. If you test positive for cocaine, your surgery will be cancelled.  On the morning of surgery brush your teeth with toothpaste and water, you may rinse your mouth with mouthwash if you wish. Do not swallow any toothpaste or mouthwash.  Use CHG Soap or wipes as directed on instruction sheet.  Do not wear jewelry, make-up, hairpins, clips or nail polish.  Do not wear lotions, powders, or perfumes.   Do not shave body from the neck down 48 hours prior to surgery just in case you  cut yourself which could leave a site for infection.  Also, freshly shaved skin may become irritated if using the CHG soap.  Contact lenses, hearing aids and dentures may not be worn into surgery.  Do not bring valuables to the hospital. [facility] is not responsible for any missing/lost belongings or valuables.   Bring your C-PAP to the hospital with you in case you may have to spend the night.   Notify your doctor if there is any change in your medical condition (cold, fever, infection).  Wear comfortable clothing (specific to your surgery type) to the hospital.  After surgery, you can help prevent lung complications by doing breathing exercises.  Take deep breaths and cough every 1-2 hours. Your doctor may order a device called an Incentive Spirometer to help you take deep breaths. When coughing or sneezing, hold a pillow firmly against your incision with both hands. This is called "splinting." Doing this helps protect your incision. It also decreases belly discomfort.  If you are being admitted to the hospital overnight, leave your suitcase in the car. After surgery it may be brought to your room.  If you are being discharged the day of surgery, you will not be allowed to drive home. You will need a responsible adult (18 years or older) to drive you home and stay with you that night.   If you are taking public transportation, you will need to have a responsible adult (18 years or older) with you. Please confirm with your physician that  it is acceptable to use public transportation.   Please call the Pre-admissions Testing Dept. at [phone] if you have any questions about these instructions.  Surgery Visitation Policy:  Patients undergoing a surgery or procedure may have two family members or support persons with them as long as the person is not COVID-19 positive or experiencing its symptoms.   Inpatient Visitation:    Visiting hours are 7 a.m. to 8 p.m. Up to four  visitors are allowed at one time in a patient room, including children. The visitors may rotate out with other people during the day. One designated support person (adult) may remain overnight.

## 2021-07-18 ENCOUNTER — Encounter: Payer: Self-pay | Admitting: Surgery

## 2021-07-22 MED ORDER — CHLORHEXIDINE GLUCONATE CLOTH 2 % EX PADS: 6.0000 | MEDICATED_PAD | Freq: Once | CUTANEOUS | Status: DC

## 2021-07-22 MED ORDER — CHLORHEXIDINE GLUCONATE 0.12 % MT SOLN: 15.0000 mL | Freq: Once | OROMUCOSAL | Status: AC

## 2021-07-22 MED ORDER — GABAPENTIN 300 MG PO CAPS: 300.0000 mg | ORAL_CAPSULE | ORAL | Status: AC

## 2021-07-22 MED ORDER — LACTATED RINGERS IV SOLN: INTRAVENOUS | Status: DC

## 2021-07-22 MED ORDER — ALVIMOPAN 12 MG PO CAPS: 12.0000 mg | ORAL_CAPSULE | ORAL | Status: AC

## 2021-07-22 MED ORDER — SODIUM CHLORIDE 0.9 % IV SOLN
2.0000 g | INTRAVENOUS | Status: AC
  Administered 2021-07-23: 2 g via INTRAVENOUS

## 2021-07-22 MED ORDER — ORAL CARE MOUTH RINSE: 15.0000 mL | Freq: Once | OROMUCOSAL | Status: AC

## 2021-07-23 ENCOUNTER — Other Ambulatory Visit: Payer: Self-pay

## 2021-07-23 ENCOUNTER — Inpatient Hospital Stay: Payer: Medicare Other | Admitting: Urgent Care

## 2021-07-23 ENCOUNTER — Encounter
Admission: AD | Disposition: A | Discharge status: Home / Self Care | Payer: Self-pay | Source: Home / Self Care | Attending: Surgery

## 2021-07-23 ENCOUNTER — Inpatient Hospital Stay
Admission: AD | Admit: 2021-07-23 | Discharge: 2021-07-25 | DRG: 330 | Disposition: A | Discharge status: Home / Self Care | Payer: Medicare Other | Attending: Surgery | Admitting: Surgery

## 2021-07-23 ENCOUNTER — Encounter: Payer: Self-pay | Admitting: Surgery

## 2021-07-23 DIAGNOSIS — E039 Hypothyroidism, unspecified: Secondary | ICD-10-CM | POA: Diagnosis present

## 2021-07-23 DIAGNOSIS — E7800 Pure hypercholesterolemia, unspecified: Secondary | ICD-10-CM | POA: Diagnosis present

## 2021-07-23 DIAGNOSIS — G4733 Obstructive sleep apnea (adult) (pediatric): Secondary | ICD-10-CM | POA: Diagnosis not present

## 2021-07-23 DIAGNOSIS — Z6841 Body Mass Index (BMI) 40.0 and over, adult: Secondary | ICD-10-CM

## 2021-07-23 DIAGNOSIS — F32A Depression, unspecified: Secondary | ICD-10-CM | POA: Diagnosis not present

## 2021-07-23 DIAGNOSIS — Z8619 Personal history of other infectious and parasitic diseases: Secondary | ICD-10-CM

## 2021-07-23 DIAGNOSIS — M1990 Unspecified osteoarthritis, unspecified site: Secondary | ICD-10-CM | POA: Diagnosis present

## 2021-07-23 DIAGNOSIS — F419 Anxiety disorder, unspecified: Secondary | ICD-10-CM | POA: Diagnosis present

## 2021-07-23 DIAGNOSIS — K5732 Diverticulitis of large intestine without perforation or abscess without bleeding: Secondary | ICD-10-CM

## 2021-07-23 DIAGNOSIS — Z801 Family history of malignant neoplasm of trachea, bronchus and lung: Secondary | ICD-10-CM | POA: Diagnosis not present

## 2021-07-23 DIAGNOSIS — Z808 Family history of malignant neoplasm of other organs or systems: Secondary | ICD-10-CM | POA: Diagnosis not present

## 2021-07-23 DIAGNOSIS — Z825 Family history of asthma and other chronic lower respiratory diseases: Secondary | ICD-10-CM | POA: Diagnosis not present

## 2021-07-23 DIAGNOSIS — Z818 Family history of other mental and behavioral disorders: Secondary | ICD-10-CM

## 2021-07-23 DIAGNOSIS — K5730 Diverticulosis of large intestine without perforation or abscess without bleeding: Secondary | ICD-10-CM | POA: Diagnosis not present

## 2021-07-23 DIAGNOSIS — G8929 Other chronic pain: Secondary | ICD-10-CM | POA: Diagnosis not present

## 2021-07-23 DIAGNOSIS — K219 Gastro-esophageal reflux disease without esophagitis: Secondary | ICD-10-CM | POA: Diagnosis not present

## 2021-07-23 DIAGNOSIS — K660 Peritoneal adhesions (postprocedural) (postinfection): Secondary | ICD-10-CM | POA: Diagnosis not present

## 2021-07-23 DIAGNOSIS — G4730 Sleep apnea, unspecified: Secondary | ICD-10-CM | POA: Diagnosis not present

## 2021-07-23 DIAGNOSIS — K5792 Diverticulitis of intestine, part unspecified, without perforation or abscess without bleeding: Secondary | ICD-10-CM

## 2021-07-23 DIAGNOSIS — Z9049 Acquired absence of other specified parts of digestive tract: Principal | ICD-10-CM

## 2021-07-23 DIAGNOSIS — Z01812 Encounter for preprocedural laboratory examination: Secondary | ICD-10-CM

## 2021-07-23 DIAGNOSIS — M797 Fibromyalgia: Secondary | ICD-10-CM | POA: Diagnosis not present

## 2021-07-23 HISTORY — DX: Obstructive sleep apnea (adult) (pediatric): G47.33

## 2021-07-23 LAB — CBC
HCT: 43.7 % (ref 39.0–52.0)
Hemoglobin: 14.4 g/dL (ref 13.0–17.0)
MCH: 28.9 pg (ref 26.0–34.0)
MCHC: 33 g/dL (ref 30.0–36.0)
MCV: 87.6 fL (ref 80.0–100.0)
Platelets: 217 10*3/uL (ref 150–400)
RBC: 4.99 MIL/uL (ref 4.22–5.81)
RDW: 12.4 % (ref 11.5–15.5)
WBC: 18.7 10*3/uL — ABNORMAL HIGH (ref 4.0–10.5)
nRBC: 0 % (ref 0.0–0.2)

## 2021-07-23 LAB — HIV ANTIBODY (ROUTINE TESTING W REFLEX): HIV Screen 4th Generation wRfx: NONREACTIVE

## 2021-07-23 LAB — CREATININE, SERUM
Creatinine, Ser: 0.92 mg/dL (ref 0.61–1.24)
GFR, Estimated: >60 mL/min (ref >60)

## 2021-07-23 LAB — TYPE AND SCREEN
ABO/RH(D): O NEG
Antibody Screen: NEGATIVE

## 2021-07-23 SURGERY — COLECTOMY, SIGMOID, ROBOT-ASSISTED
Anesthesia: General

## 2021-07-23 MED ORDER — PROPOFOL 10 MG/ML IV BOLUS
INTRAVENOUS | Status: DC | PRN
  Administered 2021-07-23: 200 mg via INTRAVENOUS

## 2021-07-23 MED ORDER — POLYVINYL ALCOHOL 1.4 % OP SOLN
2.0000 [drp] | OPHTHALMIC | Status: DC | PRN
  Filled 2021-07-23: qty 15

## 2021-07-23 MED ORDER — SODIUM CHLORIDE 0.9 % IV SOLN
INTRAVENOUS | Status: DC | PRN
  Administered 2021-07-23: 70 mL

## 2021-07-23 MED ORDER — FENTANYL CITRATE (PF) 100 MCG/2ML IJ SOLN
INTRAMUSCULAR | Status: DC | PRN
  Administered 2021-07-23: 25 ug via INTRAVENOUS
  Administered 2021-07-23: 50 ug via INTRAVENOUS
  Administered 2021-07-23: 25 ug via INTRAVENOUS

## 2021-07-23 MED ORDER — KETAMINE HCL 10 MG/ML IJ SOLN
INTRAMUSCULAR | Status: DC | PRN
  Administered 2021-07-23 (×5): 10 mg via INTRAVENOUS

## 2021-07-23 MED ORDER — METHOCARBAMOL 500 MG PO TABS: 500.0000 mg | ORAL_TABLET | Freq: Three times a day (TID) | ORAL | Status: DC | PRN

## 2021-07-23 MED ORDER — ONDANSETRON HCL 4 MG/2ML IJ SOLN: 4.0000 mg | Freq: Once | INTRAMUSCULAR | Status: DC | PRN

## 2021-07-23 MED ORDER — MIDAZOLAM HCL 2 MG/2ML IJ SOLN
INTRAMUSCULAR | Status: AC
  Filled 2021-07-23: qty 2

## 2021-07-23 MED ORDER — PREGABALIN 50 MG PO CAPS
100.0000 mg | ORAL_CAPSULE | Freq: Three times a day (TID) | ORAL | Status: DC
  Administered 2021-07-23 – 2021-07-25 (×6): 100 mg via ORAL
  Filled 2021-07-23 (×6): qty 2

## 2021-07-23 MED ORDER — HYDROMORPHONE HCL 1 MG/ML IJ SOLN
INTRAMUSCULAR | Status: AC
  Administered 2021-07-23: 0.5 mg via INTRAVENOUS
  Filled 2021-07-23: qty 1

## 2021-07-23 MED ORDER — SUGAMMADEX SODIUM 500 MG/5ML IV SOLN
INTRAVENOUS | Status: AC
  Filled 2021-07-23: qty 5

## 2021-07-23 MED ORDER — ALVIMOPAN 12 MG PO CAPS
ORAL_CAPSULE | ORAL | Status: AC
  Administered 2021-07-23: 12 mg via ORAL
  Filled 2021-07-23: qty 1

## 2021-07-23 MED ORDER — MIDAZOLAM HCL 2 MG/2ML IJ SOLN
INTRAMUSCULAR | Status: DC | PRN
  Administered 2021-07-23: 2 mg via INTRAVENOUS

## 2021-07-23 MED ORDER — GLYCOPYRROLATE 0.2 MG/ML IJ SOLN
INTRAMUSCULAR | Status: AC
  Filled 2021-07-23: qty 1

## 2021-07-23 MED ORDER — FAMOTIDINE 20 MG PO TABS
ORAL_TABLET | ORAL | Status: AC
  Filled 2021-07-23: qty 1

## 2021-07-23 MED ORDER — DIPHENHYDRAMINE HCL 50 MG/ML IJ SOLN: 25.0000 mg | Freq: Four times a day (QID) | INTRAMUSCULAR | Status: DC | PRN

## 2021-07-23 MED ORDER — KETOROLAC TROMETHAMINE 30 MG/ML IJ SOLN
30.0000 mg | Freq: Four times a day (QID) | INTRAMUSCULAR | Status: DC
  Administered 2021-07-23 – 2021-07-25 (×8): 30 mg via INTRAVENOUS
  Filled 2021-07-23 (×8): qty 1

## 2021-07-23 MED ORDER — LIDOCAINE 5 % EX PTCH
2.0000 | MEDICATED_PATCH | CUTANEOUS | Status: DC
  Administered 2021-07-23 – 2021-07-24 (×2): 2 via TRANSDERMAL
  Filled 2021-07-23 (×3): qty 2

## 2021-07-23 MED ORDER — PHENYLEPHRINE 80 MCG/ML (10ML) SYRINGE FOR IV PUSH (FOR BLOOD PRESSURE SUPPORT)
PREFILLED_SYRINGE | INTRAVENOUS | Status: AC
  Filled 2021-07-23: qty 10

## 2021-07-23 MED ORDER — ROCURONIUM BROMIDE 100 MG/10ML IV SOLN
INTRAVENOUS | Status: DC | PRN
  Administered 2021-07-23: 90 mg via INTRAVENOUS
  Administered 2021-07-23: 30 mg via INTRAVENOUS
  Administered 2021-07-23 (×2): 20 mg via INTRAVENOUS
  Administered 2021-07-23 (×2): 10 mg via INTRAVENOUS

## 2021-07-23 MED ORDER — GLYCOPYRROLATE 0.2 MG/ML IJ SOLN
INTRAMUSCULAR | Status: DC | PRN
  Administered 2021-07-23: .1 mg via INTRAVENOUS

## 2021-07-23 MED ORDER — LIDOCAINE HCL (PF) 2 % IJ SOLN
INTRAMUSCULAR | Status: AC
Start: 2021-07-23 — End: ?
  Filled 2021-07-23: qty 5

## 2021-07-23 MED ORDER — INDOCYANINE GREEN 25 MG IV SOLR
INTRAVENOUS | Status: DC | PRN
  Administered 2021-07-23: 5 mg via INTRAVENOUS

## 2021-07-23 MED ORDER — SODIUM CHLORIDE (PF) 0.9 % IJ SOLN
INTRAMUSCULAR | Status: AC
  Filled 2021-07-23: qty 50

## 2021-07-23 MED ORDER — ONDANSETRON HCL 4 MG/2ML IJ SOLN
INTRAMUSCULAR | Status: DC | PRN
  Administered 2021-07-23: 4 mg via INTRAVENOUS

## 2021-07-23 MED ORDER — BUPIVACAINE LIPOSOME 1.3 % IJ SUSP
INTRAMUSCULAR | Status: AC
  Filled 2021-07-23: qty 20

## 2021-07-23 MED ORDER — HYDROMORPHONE HCL 1 MG/ML IJ SOLN: 1.0000 mg | INTRAMUSCULAR | Status: DC | PRN

## 2021-07-23 MED ORDER — PANTOPRAZOLE SODIUM 40 MG PO TBEC
40.0000 mg | DELAYED_RELEASE_TABLET | Freq: Every day | ORAL | Status: DC
  Administered 2021-07-23 – 2021-07-25 (×3): 40 mg via ORAL
  Filled 2021-07-23 (×3): qty 1

## 2021-07-23 MED ORDER — BUPIVACAINE-EPINEPHRINE 0.25% -1:200000 IJ SOLN
INTRAMUSCULAR | Status: DC | PRN
  Administered 2021-07-23: 30 mL

## 2021-07-23 MED ORDER — PROCHLORPERAZINE MALEATE 10 MG PO TABS: 10.0000 mg | ORAL_TABLET | Freq: Four times a day (QID) | ORAL | Status: DC | PRN

## 2021-07-23 MED ORDER — FENTANYL CITRATE (PF) 100 MCG/2ML IJ SOLN
INTRAMUSCULAR | Status: AC
  Administered 2021-07-23: 50 ug via INTRAVENOUS
  Filled 2021-07-23: qty 2

## 2021-07-23 MED ORDER — PROMETHAZINE HCL 25 MG/ML IJ SOLN: 6.2500 mg | INTRAMUSCULAR | Status: DC | PRN

## 2021-07-23 MED ORDER — OXYCODONE HCL 5 MG PO TABS: 5.0000 mg | ORAL_TABLET | ORAL | Status: DC | PRN

## 2021-07-23 MED ORDER — ROCURONIUM BROMIDE 10 MG/ML (PF) SYRINGE
PREFILLED_SYRINGE | INTRAVENOUS | Status: AC
  Filled 2021-07-23: qty 10

## 2021-07-23 MED ORDER — DROPERIDOL 2.5 MG/ML IJ SOLN: 0.6250 mg | Freq: Once | INTRAMUSCULAR | Status: DC | PRN

## 2021-07-23 MED ORDER — SUGAMMADEX SODIUM 200 MG/2ML IV SOLN
INTRAVENOUS | Status: DC | PRN
  Administered 2021-07-23: 300 mg via INTRAVENOUS

## 2021-07-23 MED ORDER — DIPHENHYDRAMINE HCL 25 MG PO CAPS: 25.0000 mg | ORAL_CAPSULE | Freq: Four times a day (QID) | ORAL | Status: DC | PRN

## 2021-07-23 MED ORDER — ONDANSETRON HCL 4 MG/2ML IJ SOLN
INTRAMUSCULAR | Status: AC
  Filled 2021-07-23: qty 2

## 2021-07-23 MED ORDER — HYDROMORPHONE HCL 1 MG/ML IJ SOLN: 0.2500 mg | INTRAMUSCULAR | Status: DC | PRN

## 2021-07-23 MED ORDER — ALVIMOPAN 12 MG PO CAPS: 12.0000 mg | ORAL_CAPSULE | ORAL | Status: DC

## 2021-07-23 MED ORDER — GABAPENTIN 300 MG PO CAPS
ORAL_CAPSULE | ORAL | Status: AC
  Administered 2021-07-23: 300 mg via ORAL
  Filled 2021-07-23: qty 1

## 2021-07-23 MED ORDER — OXYCODONE HCL 5 MG PO TABS: 5.0000 mg | ORAL_TABLET | Freq: Once | ORAL | Status: DC | PRN

## 2021-07-23 MED ORDER — DEXAMETHASONE SODIUM PHOSPHATE 10 MG/ML IJ SOLN
INTRAMUSCULAR | Status: AC
  Filled 2021-07-23: qty 1

## 2021-07-23 MED ORDER — DEXAMETHASONE SODIUM PHOSPHATE 10 MG/ML IJ SOLN
INTRAMUSCULAR | Status: DC | PRN
  Administered 2021-07-23: 10 mg via INTRAVENOUS

## 2021-07-23 MED ORDER — ACETAMINOPHEN 10 MG/ML IV SOLN
INTRAVENOUS | Status: AC
  Filled 2021-07-23: qty 100

## 2021-07-23 MED ORDER — METHOCARBAMOL 1000 MG/10ML IJ SOLN
500.0000 mg | Freq: Three times a day (TID) | INTRAVENOUS | Status: DC | PRN
  Administered 2021-07-23: 500 mg via INTRAVENOUS
  Filled 2021-07-23: qty 500

## 2021-07-23 MED ORDER — SEVOFLURANE IN SOLN
RESPIRATORY_TRACT | Status: AC
  Filled 2021-07-23: qty 250

## 2021-07-23 MED ORDER — ONDANSETRON 4 MG PO TBDP: 4.0000 mg | ORAL_TABLET | Freq: Four times a day (QID) | ORAL | Status: DC | PRN

## 2021-07-23 MED ORDER — BUPIVACAINE-EPINEPHRINE (PF) 0.25% -1:200000 IJ SOLN
INTRAMUSCULAR | Status: AC
  Filled 2021-07-23: qty 30

## 2021-07-23 MED ORDER — CHLORHEXIDINE GLUCONATE 0.12 % MT SOLN
OROMUCOSAL | Status: AC
  Administered 2021-07-23: 15 mL via OROMUCOSAL
  Filled 2021-07-23: qty 15

## 2021-07-23 MED ORDER — PHENYLEPHRINE HCL (PRESSORS) 10 MG/ML IV SOLN
INTRAVENOUS | Status: DC | PRN
  Administered 2021-07-23: 160 ug via INTRAVENOUS

## 2021-07-23 MED ORDER — SODIUM CHLORIDE 0.9 % IV SOLN
INTRAVENOUS | Status: AC
  Filled 2021-07-23: qty 2

## 2021-07-23 MED ORDER — DEXMEDETOMIDINE HCL IN NACL 80 MCG/20ML IV SOLN
INTRAVENOUS | Status: AC
  Filled 2021-07-23: qty 20

## 2021-07-23 MED ORDER — DEXMEDETOMIDINE HCL IN NACL 200 MCG/50ML IV SOLN
INTRAVENOUS | Status: DC | PRN
  Administered 2021-07-23: 8 ug via INTRAVENOUS
  Administered 2021-07-23 (×5): 4 ug via INTRAVENOUS

## 2021-07-23 MED ORDER — PROPOFOL 10 MG/ML IV BOLUS
INTRAVENOUS | Status: AC
  Filled 2021-07-23: qty 20

## 2021-07-23 MED ORDER — SODIUM CHLORIDE 0.9 % IV SOLN
2.0000 g | Freq: Two times a day (BID) | INTRAVENOUS | Status: AC
  Administered 2021-07-23 – 2021-07-24 (×2): 2 g via INTRAVENOUS
  Filled 2021-07-23 (×2): qty 2

## 2021-07-23 MED ORDER — FENTANYL CITRATE (PF) 100 MCG/2ML IJ SOLN: 25.0000 ug | INTRAMUSCULAR | Status: DC | PRN

## 2021-07-23 MED ORDER — FENTANYL CITRATE (PF) 100 MCG/2ML IJ SOLN
INTRAMUSCULAR | Status: AC
  Filled 2021-07-23: qty 2

## 2021-07-23 MED ORDER — TIZANIDINE HCL 4 MG PO TABS
4.0000 mg | ORAL_TABLET | Freq: Four times a day (QID) | ORAL | Status: DC | PRN
Start: 2021-07-23 — End: 2021-07-25

## 2021-07-23 MED ORDER — OXYCODONE HCL 5 MG/5ML PO SOLN: 5.0000 mg | Freq: Once | ORAL | Status: DC | PRN

## 2021-07-23 MED ORDER — SODIUM CHLORIDE 0.9 % IV SOLN: INTRAVENOUS | Status: DC

## 2021-07-23 MED ORDER — LIDOCAINE HCL (PF) 2 % IJ SOLN
INTRAMUSCULAR | Status: AC
  Filled 2021-07-23: qty 5

## 2021-07-23 MED ORDER — PROCHLORPERAZINE EDISYLATE 10 MG/2ML IJ SOLN: 5.0000 mg | Freq: Four times a day (QID) | INTRAMUSCULAR | Status: DC | PRN

## 2021-07-23 MED ORDER — FENTANYL CITRATE (PF) 100 MCG/2ML IJ SOLN
25.0000 ug | INTRAMUSCULAR | Status: DC | PRN
  Administered 2021-07-23 (×2): 25 ug via INTRAVENOUS

## 2021-07-23 MED ORDER — LEVOTHYROXINE SODIUM 50 MCG PO TABS
50.0000 ug | ORAL_TABLET | Freq: Every day | ORAL | Status: DC
  Administered 2021-07-23 – 2021-07-25 (×3): 50 ug via ORAL
  Filled 2021-07-23 (×4): qty 1

## 2021-07-23 MED ORDER — HYDROMORPHONE HCL 1 MG/ML IJ SOLN
0.5000 mg | INTRAMUSCULAR | Status: DC | PRN
  Administered 2021-07-23: 0.5 mg via INTRAVENOUS

## 2021-07-23 MED ORDER — ENOXAPARIN SODIUM 40 MG/0.4ML IJ SOSY
40.0000 mg | PREFILLED_SYRINGE | INTRAMUSCULAR | Status: DC
  Administered 2021-07-24 – 2021-07-25 (×2): 40 mg via SUBCUTANEOUS
  Filled 2021-07-23 (×2): qty 0.4

## 2021-07-23 MED ORDER — KETAMINE HCL 50 MG/5ML IJ SOSY
PREFILLED_SYRINGE | INTRAMUSCULAR | Status: AC
  Filled 2021-07-23: qty 5

## 2021-07-23 MED ORDER — ONDANSETRON HCL 4 MG/2ML IJ SOLN: 4.0000 mg | Freq: Four times a day (QID) | INTRAMUSCULAR | Status: DC | PRN

## 2021-07-23 MED ORDER — LIDOCAINE HCL (CARDIAC) PF 100 MG/5ML IV SOSY
PREFILLED_SYRINGE | INTRAVENOUS | Status: DC | PRN
  Administered 2021-07-23: 100 mg via INTRAVENOUS

## 2021-07-23 SURGICAL SUPPLY — 98 items
BAG LAPAROSCOPIC 12 15 PORT 16 (BASKET) IMPLANT
BAG RETRIEVAL 12/15 (BASKET)
CANNULA REDUC XI 12-8 STAPL (CANNULA) ×1
CANNULA REDUCER 12-8 DVNC XI (CANNULA) ×1 IMPLANT
CATH ROBINSON RED A/P 16FR (CATHETERS) ×2 IMPLANT
COVER MAYO STAND REUSABLE (DRAPES) ×2 IMPLANT
COVER TIP SHEARS 8 DVNC (MISCELLANEOUS) ×1 IMPLANT
COVER TIP SHEARS 8MM DA VINCI (MISCELLANEOUS) ×1
DERMABOND ADVANCED (GAUZE/BANDAGES/DRESSINGS) ×1
DERMABOND ADVANCED .7 DNX12 (GAUZE/BANDAGES/DRESSINGS) ×1 IMPLANT
DRAPE 3/4 80X56 (DRAPES) ×2 IMPLANT
DRAPE ARM DVNC X/XI (DISPOSABLE) ×4 IMPLANT
DRAPE COLUMN DVNC XI (DISPOSABLE) ×1 IMPLANT
DRAPE DA VINCI XI ARM (DISPOSABLE) ×4
DRAPE DA VINCI XI COLUMN (DISPOSABLE) ×1
DRAPE LEGGINS SURG 28X43 STRL (DRAPES) ×2 IMPLANT
DRAPE UNDER BUTTOCK W/FLU (DRAPES) ×2 IMPLANT
ELECT BLADE 6.5 EXT (BLADE) ×2 IMPLANT
ELECT CAUTERY BLADE 6.4 (BLADE) ×2 IMPLANT
ELECT REM PT RETURN 9FT ADLT (ELECTROSURGICAL) ×2
ELECTRODE REM PT RTRN 9FT ADLT (ELECTROSURGICAL) ×1 IMPLANT
GLOVE BIO SURGEON STRL SZ7 (GLOVE) ×13 IMPLANT
GOWN STRL REUS W/ TWL LRG LVL3 (GOWN DISPOSABLE) ×5 IMPLANT
GOWN STRL REUS W/TWL LRG LVL3 (GOWN DISPOSABLE) ×5
GRASPER LAPSCPC 5X45 DSP (INSTRUMENTS) ×2 IMPLANT
HANDLE YANKAUER SUCT BULB TIP (MISCELLANEOUS) ×2 IMPLANT
IRRIGATION STRYKERFLOW (MISCELLANEOUS) ×1 IMPLANT
IRRIGATOR STRYKERFLOW (MISCELLANEOUS) ×2
IV NS 1000ML (IV SOLUTION) ×1
IV NS 1000ML BAXH (IV SOLUTION) ×1 IMPLANT
KIT IMAGING PINPOINTPAQ (MISCELLANEOUS) ×2 IMPLANT
KIT PINK PAD W/HEAD ARE REST (MISCELLANEOUS) ×2
KIT PINK PAD W/HEAD ARM REST (MISCELLANEOUS) ×1 IMPLANT
LABEL OR SOLS (LABEL) ×2 IMPLANT
MANIFOLD NEPTUNE II (INSTRUMENTS) ×2 IMPLANT
NEEDLE HYPO 22GX1.5 SAFETY (NEEDLE) ×2 IMPLANT
NS IRRIG 500ML POUR BTL (IV SOLUTION) ×2 IMPLANT
OBTURATOR OPTICAL STANDARD 8MM (TROCAR) ×1
OBTURATOR OPTICAL STND 8 DVNC (TROCAR) ×1
OBTURATOR OPTICALSTD 8 DVNC (TROCAR) ×1 IMPLANT
PACK COLON CLEAN CLOSURE (MISCELLANEOUS) ×2 IMPLANT
PACK LAP CHOLECYSTECTOMY (MISCELLANEOUS) ×2 IMPLANT
PAD PREP 24X41 OB/GYN DISP (PERSONAL CARE ITEMS) ×2 IMPLANT
PENCIL ELECTRO HAND CTR (MISCELLANEOUS) ×2 IMPLANT
PORT ACCESS TROCAR AIRSEAL 5 (TROCAR) ×2 IMPLANT
RELOAD STAPLE 45 3.5 BLU DVNC (STAPLE) IMPLANT
RELOAD STAPLE 60 2.5 WHT DVNC (STAPLE) IMPLANT
RELOAD STAPLE 60 3.5 BLU DVNC (STAPLE) IMPLANT
RELOAD STAPLER 2.5X60 WHT DVNC (STAPLE) IMPLANT
RELOAD STAPLER 3.5X45 BLU DVNC (STAPLE) IMPLANT
RELOAD STAPLER 3.5X60 BLU DVNC (STAPLE) ×2 IMPLANT
SEAL CANN UNIV 5-8 DVNC XI (MISCELLANEOUS) ×3 IMPLANT
SEAL XI 5MM-8MM UNIVERSAL (MISCELLANEOUS) ×3
SEALER VESSEL DA VINCI XI (MISCELLANEOUS) ×1
SEALER VESSEL EXT DVNC XI (MISCELLANEOUS) ×1 IMPLANT
SET BI-LUMEN FLTR TB AIRSEAL (TUBING) ×2 IMPLANT
SOL PREP PVP 2OZ (MISCELLANEOUS) ×2
SOLUTION ELECTROLUBE (MISCELLANEOUS) ×2 IMPLANT
SOLUTION PREP PVP 2OZ (MISCELLANEOUS) ×1 IMPLANT
SPIKE FLUID TRANSFER (MISCELLANEOUS) ×2 IMPLANT
SPONGE T-LAP 18X18 ~~LOC~~+RFID (SPONGE) ×6 IMPLANT
SPONGE T-LAP 4X18 ~~LOC~~+RFID (SPONGE) ×2 IMPLANT
STAPLER 45 DA VINCI SURE FORM (STAPLE)
STAPLER 45 SUREFORM DVNC (STAPLE) IMPLANT
STAPLER 60 DA VINCI SURE FORM (STAPLE) ×1
STAPLER 60 SUREFORM DVNC (STAPLE) IMPLANT
STAPLER CANNULA SEAL DVNC XI (STAPLE) ×1 IMPLANT
STAPLER CANNULA SEAL XI (STAPLE) ×1
STAPLER CIRCULAR MANUAL XL 25 (STAPLE) IMPLANT
STAPLER CIRCULAR MANUAL XL 29 (STAPLE) IMPLANT
STAPLER CIRCULAR MANUAL XL 33 (STAPLE) ×1 IMPLANT
STAPLER RELOAD 2.5X60 WHITE (STAPLE)
STAPLER RELOAD 2.5X60 WHT DVNC (STAPLE)
STAPLER RELOAD 3.5X45 BLU DVNC (STAPLE)
STAPLER RELOAD 3.5X45 BLUE (STAPLE)
STAPLER RELOAD 3.5X60 BLU DVNC (STAPLE) ×2
STAPLER RELOAD 3.5X60 BLUE (STAPLE) ×2
SURGILUBE 2OZ TUBE FLIPTOP (MISCELLANEOUS) ×2 IMPLANT
SUT DVC VLOC 3-0 CL 6 P-12 (SUTURE) IMPLANT
SUT MNCRL AB 4-0 PS2 18 (SUTURE) ×2 IMPLANT
SUT PDS AB 0 CT1 27 (SUTURE) ×4 IMPLANT
SUT PDS AB 2-0 CT2 27 (SUTURE) ×1 IMPLANT
SUT SILK 2 0 (SUTURE) ×1
SUT SILK 2 0 SH (SUTURE) ×1 IMPLANT
SUT SILK 2-0 30XBRD TIE 12 (SUTURE) ×1 IMPLANT
SUT VIC AB 2-0 SH 27 (SUTURE) ×1
SUT VIC AB 2-0 SH 27XBRD (SUTURE) ×1 IMPLANT
SUT VICRYL 0 AB UR-6 (SUTURE) ×2 IMPLANT
SUT VLOC 90 S/L VL9 GS22 (SUTURE) IMPLANT
SYR 20ML LL LF (SYRINGE) ×4 IMPLANT
SYR BULB IRRIG 60ML STRL (SYRINGE) ×1 IMPLANT
SYR TOOMEY IRRIG 70ML (MISCELLANEOUS) ×2
SYRINGE TOOMEY IRRIG 70ML (MISCELLANEOUS) ×1 IMPLANT
SYS TROCAR 1.5-3 SLV ABD GEL (ENDOMECHANICALS) ×2
SYSTEM TROCR 1.5-3 SLV ABD GEL (ENDOMECHANICALS) ×1 IMPLANT
TOWEL OR 17X26 4PK STRL BLUE (TOWEL DISPOSABLE) ×1 IMPLANT
TRAY FOLEY SLVR 16FR LF STAT (SET/KITS/TRAYS/PACK) ×2 IMPLANT
WATER STERILE IRR 500ML POUR (IV SOLUTION) ×2 IMPLANT

## 2021-07-23 NOTE — Anesthesia Procedure Notes (Signed)
Procedure Name: Intubation Date/Time: 07/23/2021 10:02 AM Performed by: Morene Crocker, CRNA Pre-anesthesia Checklist: Patient identified, Patient being monitored, Timeout performed, Emergency Drugs available and Suction available Patient Re-evaluated:Patient Re-evaluated prior to induction Oxygen Delivery Method: Circle system utilized Preoxygenation: Pre-oxygenation with 100% oxygen Induction Type: IV induction Ventilation: Mask ventilation without difficulty Laryngoscope Size: 3 and McGraph Grade View: Grade I Tube type: Oral Tube size: 7.5 mm Number of attempts: 1 Airway Equipment and Method: Stylet Placement Confirmation: ETT inserted through vocal cords under direct vision, positive ETCO2 and breath sounds checked- equal and bilateral Secured at: 22 cm Tube secured with: Tape Dental Injury: Teeth and Oropharynx as per pre-operative assessment

## 2021-07-23 NOTE — Progress Notes (Signed)
Patient c/o right eye irritation states it has been bothering him for years. States he uses a wet cloth for relief as well as eye drops from his eye doctor. Warm cloth provided.

## 2021-07-23 NOTE — Plan of Care (Signed)
Pt arrived to unit AAOx4, no pain, foley in place. VS are WNL. Plan for pain control. Pt oriented to unit. Bed is in lowest position, call light within reach. Will continue to monitor.

## 2021-07-23 NOTE — Transfer of Care (Signed)
Immediate Anesthesia Transfer of Care Note  Patient: [name] [name]  Procedure(s) Performed: XI ROBOT ASSISTED SIGMOID COLECTOMY with Edison Simon, PA-C to assist  Patient Location: PACU  Anesthesia Type:General  Level of Consciousness: drowsy  Airway & Oxygen Therapy: Patient Spontanous Breathing and Patient connected to face mask oxygen  Post-op Assessment: Report given to RN and Post -op Vital signs reviewed and stable  Post vital signs: Reviewed and stable  Last Vitals:  Vitals Value Taken Time  BP 132/71 07/23/21 1439  Temp 36.7 C 07/23/21 1439  Pulse 84 07/23/21 1445  Resp 16 07/23/21 1445  SpO2 99 % 07/23/21 1445  Vitals shown include unvalidated device data.  Last Pain:  Vitals:   07/23/21 0825  TempSrc: Oral  PainSc: 0-No pain         Complications: No notable events documented.

## 2021-07-23 NOTE — Interval H&P Note (Signed)
History and Physical Interval Note:  07/23/2021 9:16 AM  [name] [name]  has presented today for surgery, with the diagnosis of diverticulitis.  The various methods of treatment have been discussed with the patient and family. After consideration of risks, benefits and other options for treatment, the patient has consented to  Procedure(s): XI ROBOT ASSISTED SIGMOID COLECTOMY with Lynden Oxford, PA-C to assist (N/A) as a surgical intervention.  The patient's history has been reviewed, patient examined, no change in status, stable for surgery.  I have reviewed the patient's chart and labs.  Questions were answered to the patient's satisfaction.     Kinsley Nicklaus F Aleeha Boline

## 2021-07-23 NOTE — Anesthesia Postprocedure Evaluation (Signed)
Anesthesia Post Note  Patient: [name] [name]  Procedure(s) Performed: XI ROBOT ASSISTED SIGMOID COLECTOMY with Lynden Oxford, PA-C to assist  Patient location during evaluation: PACU Anesthesia Type: General Level of consciousness: awake and alert Pain management: pain level controlled Vital Signs Assessment: post-procedure vital signs reviewed and stable Respiratory status: spontaneous breathing, nonlabored ventilation, respiratory function stable and patient connected to nasal cannula oxygen Cardiovascular status: blood pressure returned to baseline and stable Postop Assessment: no apparent nausea or vomiting Anesthetic complications: no   No notable events documented.   Last Vitals:  Vitals:   07/23/21 1545 07/23/21 1600  BP: 118/68 120/66  Pulse: 72 73  Resp: 12 (!) 9  Temp:    SpO2: 93% 93%    Last Pain:  Vitals:   07/23/21 1600  TempSrc:   PainSc: Asleep                 Yevette Edwards

## 2021-07-23 NOTE — Anesthesia Preprocedure Evaluation (Addendum)
Anesthesia Evaluation  Patient identified by MRN, date of birth, ID band Patient awake    Reviewed: Allergy & Precautions, NPO status , Patient's Chart, lab work & pertinent test results  Airway Mallampati: I  TM Distance: >3 FB Neck ROM: full    Dental no notable dental hx.    Pulmonary shortness of breath and with exertion, sleep apnea and Continuous Positive Airway Pressure Ventilation ,    Pulmonary exam normal        Cardiovascular Exercise Tolerance: Good +CHF (DD)  Normal cardiovascular exam  TTE 12/30/2019: norm LV size; mod LVH; EF 0000000 with diastolic dysfunction (details Re: grade not available); norm RV size and function; mod LAE; triv MR/TR; norm AoV.   Neuro/Psych PSYCHIATRIC DISORDERS Anxiety Juvenile epilepsy (last seizure in 1987 or 1988  Neuromuscular disease    GI/Hepatic Bowel prep,GERD  Medicated,(+)     substance abuse  marijuana use, Hepatitis -, CDiverticulitis  Chronic nausea   Endo/Other  Hypothyroidism Morbid obesity  Renal/GU      Musculoskeletal  (+) Arthritis , Fibromyalgia -  Abdominal (+) + obese,   Peds  Hematology negative hematology ROS (+)   Anesthesia Other Findings Past Medical History: No date: Acid reflux No date: Anxiety 12/30/2019: Aortic root dilatation (HCC)     Comment:  a.) TTE 12/30/2019: measured 39 mm. No date: Arthritis No date: Carpal tunnel syndrome, bilateral No date: Depression [id number]: Diastolic dysfunction     Comment:  a.) TTE 12/30/2019: norm LV size; mod LVH; EF 55-60%               with diastolic dysfunction (details Re: grade not               available); norm RV size and function; mod LAE; triv               MR/TR; norm AoV. No date: Diverticulitis No date: Dyspnea No date: Fibromyalgia No date: GERD (gastroesophageal reflux disease) No date: Glaucoma No date: Hepatitis C No date: High cholesterol No date: History of kidney stones No  date: Hypothyroidism No date: Juvenile epilepsy ([place]) No date: OSA on CPAP No date: Positive TB test  Past Surgical History: 02/01/2021: COLONOSCOPY WITH PROPOFOL; N/A     Comment:  Procedure: COLONOSCOPY WITH PROPOFOL;  Surgeon:               Lesly Rubenstein, MD;  Location: [facility] ENDOSCOPY;                Service: Endoscopy;  Laterality: N/A; 02/01/2021: ESOPHAGOGASTRODUODENOSCOPY (EGD) WITH PROPOFOL; N/A     Comment:  Procedure: ESOPHAGOGASTRODUODENOSCOPY (EGD) WITH               PROPOFOL;  Surgeon: Lesly Rubenstein, MD;  Location:               [facility] ENDOSCOPY;  Service: Endoscopy;  Laterality: N/A; No date: LIVER BIOPSY     Reproductive/Obstetrics negative OB ROS                          Anesthesia Physical Anesthesia Plan  ASA: 3  Anesthesia Plan: General ETT   Post-op Pain Management: Gabapentin PO (pre-op)* and Dilaudid IV   Induction: Intravenous  PONV Risk Score and Plan: 3 and Ondansetron, Dexamethasone, Midazolam and Treatment may vary due to age or medical condition  Airway Management Planned: Oral ETT  Additional Equipment:   Intra-op Plan:   Post-operative Plan: Extubation  in OR  Informed Consent: I have reviewed the patients History and Physical, chart, labs and discussed the procedure including the risks, benefits and alternatives for the proposed anesthesia with the patient or authorized representative who has indicated his/her understanding and acceptance.     Dental Advisory Given  Plan Discussed with: Anesthesiologist, CRNA and Surgeon  Anesthesia Plan Comments:      Anesthesia Quick Evaluation

## 2021-07-23 NOTE — Op Note (Signed)
PROCEDURES: 1. Robotic assisted Laparoscopic Low anterior resection 2. Robotic Laparoscopic takedown of splenic flexure 3. Perfusion check of anastomotic pedicle using ICG  Pre-operative Diagnosis: Recurrent complicated diverticulitis  Post-operative Diagnosis: same  Surgeon: Merri Ray Brilee Port   Assistants: Laqueta Due PA-C. ( required for the EEA anastomosis and for exposure)  Anesthesia: General endotracheal anesthesia  ASA Class: 2   Surgeon: Sterling Big , MD FACS  Anesthesia: Gen. with endotracheal tube   Findings: Adhesions from the sigmoid to the pelvic wall Tension free anastomosis with very good perfusion and negative intraoperative leak ( two intact doughnuts) Redundant colon with inter sigmoid adhesions   Estimated Blood Loss: 20cc                Specimens: colon       Complications: none         Condition: stable  Procedure Details  The patient was seen again in the Holding Room. The benefits, complications, treatment options, and expected outcomes were discussed with the patient. The risks of bleeding, infection, recurrence of symptoms, failure to resolve symptoms, anastomotic leak, bowel injury, any of which could require further surgery were reviewed with the patient.   The patient was taken to Operating Room, identified  and the procedure verified.  A Time Out was held and the above information confirmed.  Prior to the induction of general anesthesia, antibiotic prophylaxis was administered. VTE prophylaxis was in place. General endotracheal anesthesia was then administered and tolerated well. After the induction, the abdomen was prepped with Chloraprep and draped in the sterile fashion. The patient was positioned in lithotomy position. 4.5 cm incision was created Right lower quadrant. The abdominal cavity was entered under direct visualization and the Mini GelPort device was placed and pneumoperitoneum was obtained, no hemodynamic changes were apparent. . Three 8  mm robotic ports were placed under direct visualization and an additional 5 mm assist port was placed. Patient was positioned in steep trendelenburg and left side up. Robot was brought to the field and docked in the standard fashion. WE maintained visualization of our instruments at all times and avoided any collision between arms. I scrubbed out and went to the console. There were dense adhesions from sigmoid to the abdominal wall that where lysed in the standard fashion with the scissors. We also restore the normal anatomy by lysis adhesion from the sigmoid to the abd wall.  We identified the takeoff of the inferior mesenteric artery dissected the pedicle and divided using vessel sealer  in the standard fashion.   Using the sealer we were able to divide the mesorectum and and also divided the mesentery of the descending colon we mobilized the descending colon IN a medial to lateral fashion. We preserved the ureter at all times. Pelvic dissection was performed in a standard fashion, being in the areolar space anterior to the sacrum. THe mesorectum was divided with the vessel sealer. We dissected the rectum circumferentially and We divided at the mid rectum with standard 60 mm blue load  The white line of Toldt was identified and divided and  We were also able to mobilize the splenic flexure using scisors in the standard fashion. The mesentery of the descending colon was divided and we divided the mid descending colon with a blue load 78mm stapler. We used ICG green to make sure the distal descneding colon had good perfusion. We made sure we had adequate reach so the anastomosis could be performed tension free. The specimen was removed in a  bag. The robot was undocked and  I scrubbed back in. Under direct visualization the descending colon stump was opened and measured the diameter of the bowel A 33 mm dilator was perfect size. A pursestring was used after inserting the anvil device. Mr. Manus Rudd [facility]  was able to pass a 33 mm standard EEA stapler device through the anus and Under direct visualization we performed an end to end anastomosis with the EEA device. A leak test was performed inflating the colon with a Toomey syringe and a rubber catheter. No evidence of leak was observed. There was also adequate hemostasis.  All the laparoscopic ports were removed and a second look showed no evidence of any bleeding or any other injuries.  Liposomal marcaine was infiltrated at all incision sites in a full thickness fashion.  We changed gloves and used a clean closure tray to  close the  abdomen with two -0 PDS sutures in a running fashion to close the anterior and posterior fascia respectively. The skin incisions were closed with 4-0 Monocryl. Dermabond was used to coat all the skin incisions. Needle and laparotomy count were correct and there were no immediate complications.  Sterling Big, MD, FACS

## 2021-07-23 NOTE — Addendum Note (Signed)
Addendum  created 07/23/21 1616 by Yevette Edwards, MD   Order list changed, Pharmacy for encounter modified

## 2021-07-24 LAB — CBC
HCT: 39.4 % (ref 39.0–52.0)
Hemoglobin: 13.2 g/dL (ref 13.0–17.0)
MCH: 29.7 pg (ref 26.0–34.0)
MCHC: 33.5 g/dL (ref 30.0–36.0)
MCV: 88.7 fL (ref 80.0–100.0)
Platelets: 191 10*3/uL (ref 150–400)
RBC: 4.44 MIL/uL (ref 4.22–5.81)
RDW: 12.6 % (ref 11.5–15.5)
WBC: 13.9 10*3/uL — ABNORMAL HIGH (ref 4.0–10.5)
nRBC: 0 % (ref 0.0–0.2)

## 2021-07-24 LAB — BASIC METABOLIC PANEL
Anion gap: 5 (ref 5–15)
BUN: 15 mg/dL (ref 6–20)
CO2: 25 mmol/L (ref 22–32)
Calcium: 8.1 mg/dL — ABNORMAL LOW (ref 8.9–10.3)
Chloride: 109 mmol/L (ref 98–111)
Creatinine, Ser: 0.8 mg/dL (ref 0.61–1.24)
GFR, Estimated: >60 mL/min (ref >60)
Glucose, Bld: 120 mg/dL — ABNORMAL HIGH (ref 70–99)
Potassium: 4.2 mmol/L (ref 3.5–5.1)
Sodium: 139 mmol/L (ref 135–145)

## 2021-07-24 LAB — PHOSPHORUS: Phosphorus: 3.4 mg/dL (ref 2.5–4.6)

## 2021-07-24 LAB — MAGNESIUM: Magnesium: 2.2 mg/dL (ref 1.7–2.4)

## 2021-07-24 NOTE — Progress Notes (Signed)
Mobility Specialist - Progress Note   07/24/21 1200  Mobility  Activity Ambulated independently in hallway  Level of Assistance Independent  Assistive Device None  Distance Ambulated (ft) 360 ft  Activity Response Tolerated well  $Mobility charge 1 Mobility     Pt lying in bed upon arrival, utilizing RA. Pt voiced 4/10 soreness at incision site. Able to complete bed mobility modI. Ambulated in hallway independently, but initially was a little cautious about applying pressure on abdomen. No LOB. Does report feeling like he has gas on his stomach. Pt returned to bed with alarm set, needs in reach.    [name] [name] Mobility Specialist 07/24/21, 12:11 PM

## 2021-07-24 NOTE — Progress Notes (Addendum)
[facility] SURGICAL PROGRESS NOTE  Hospital Day(s): 1.   Post op day(s): 1 Day Post-Op.   Interval History:  Patient seen and examined No acute events or new complaints overnight.  Patient reports he is doing fantastic this morning; he is very happy No fever, chills, nausea, emesis Leukocytosis this morning to 13.9K (down from 18.7K); likely reactive from OR Hgb is stable to 13.2 Renal function is normal; sCr - 0.80; UO - 1060 ccs No electrolyte derangements He is on CLD; tolerating well Awaiting flatus   Vital signs in last 24 hours: [min-max] current  Temp:  [97.2 F (36.2 C)-98.8 F (37.1 C)] 97.6 F (36.4 C) (06/07 0340) Pulse Rate:  [62-92] 62 (06/07 0340) Resp:  [9-27] 17 (06/07 0340) BP: (94-141)/(51-91) 94/51 (06/07 0340) SpO2:  [93 %-100 %] 99 % (06/07 0340) Weight:  [147.4 kg] 147.4 kg (06/06 0825)     Height: 6\' 1"  (185.4 cm) Weight: (!) 147.4 kg BMI (Calculated): 42.89   Intake/Output last 2 shifts:  06/06 0701 - 06/07 0700 In: 3609.4 [P.O.:590; I.V.:2819.4; IV Piggyback:200] Out: 1080 [Urine:1060; Blood:20]   Physical Exam:  Constitutional: alert, cooperative and no distress  Respiratory: breathing non-labored at rest  Cardiovascular: regular rate and sinus rhythm  Gastrointestinal: soft, non-tender, non-distended, no rebound/guarding Genitourinary: Foley in place  Integumentary: Laparoscopic incisions are CDI with dermabond, no erythema or drainage   Labs:     Latest Ref Rng & Units 07/24/2021    4:33 AM 07/23/2021    5:38 PM 06/12/2021    2:47 PM  CBC  WBC 4.0 - 10.5 K/uL 13.9   18.7   8.7    Hemoglobin 13.0 - 17.0 g/dL 06/14/2021   25.3   66.4    Hematocrit 39.0 - 52.0 % 39.4   43.7   44.6    Platelets 150 - 400 K/uL 191   217   228        Latest Ref Rng & Units 07/24/2021    4:33 AM 07/23/2021    5:38 PM 06/12/2021    2:47 PM  CMP  Glucose 70 - 99 mg/dL 06/14/2021    474    BUN 6 - 20 mg/dL 15    15    Creatinine 0.61 - 1.24 mg/dL 259   5.63    8.75    Sodium 135 - 145 mmol/L 139    140    Potassium 3.5 - 5.1 mmol/L 4.2    4.0    Chloride 98 - 111 mmol/L 109    104    CO2 22 - 32 mmol/L 25    21    Calcium 8.9 - 10.3 mg/dL 8.1    9.5    Total Protein 6.0 - 8.5 g/dL   8.2    Total Bilirubin 0.0 - 1.2 mg/dL   0.8    Alkaline Phos 44 - 121 IU/L   74    AST 0 - 40 IU/L   34    ALT 0 - 44 IU/L   34       Imaging studies: No new pertinent imaging studies   Assessment/Plan:  61 y.o. male 1 Day Post-Op s/p robotic assisted laparoscopic sigmoid colectomy and EEA for history of complicated diverticulitis.   - Okay to continue CLD; can advance diet once passing flatus    - Discontinue foley catheter this morning  - Complete perioperative Abx   - Monitor abdominal examination; on-going bowel function  - Pain control prn; antiemetics  prn  - Monitor leukocytosis; likely reactive from surgery; improving - Mobilization encouraged; OOB today   - Discharge Planning; Once passing flatus and diet progression. Hopefully home in next 48 hours   All of the above findings and recommendations were discussed with the patient, and the medical team, and all of patient's questions were answered to his expressed satisfaction.  -- Lynden Oxford, PA-C [facility] 07/24/2021, 7:48 AM M-F: 7am - 4pm

## 2021-07-24 NOTE — Plan of Care (Signed)
  Problem: Clinical Measurements: Goal: Ability to maintain clinical measurements within normal limits will improve Outcome: Progressing Goal: Will remain free from infection Outcome: Progressing Goal: Diagnostic test results will improve Outcome: Progressing Goal: Respiratory complications will improve Outcome: Progressing Goal: Cardiovascular complication will be avoided Outcome: Progressing   Problem: Activity: Goal: Risk for activity intolerance will decrease Outcome: Progressing   Problem: Pain Managment: Goal: General experience of comfort will improve Outcome: Progressing   

## 2021-07-25 LAB — CBC
HCT: 41.2 % (ref 39.0–52.0)
Hemoglobin: 13.2 g/dL (ref 13.0–17.0)
MCH: 28.6 pg (ref 26.0–34.0)
MCHC: 32 g/dL (ref 30.0–36.0)
MCV: 89.4 fL (ref 80.0–100.0)
Platelets: 183 10*3/uL (ref 150–400)
RBC: 4.61 MIL/uL (ref 4.22–5.81)
RDW: 12.7 % (ref 11.5–15.5)
WBC: 9.1 10*3/uL (ref 4.0–10.5)
nRBC: 0 % (ref 0.0–0.2)

## 2021-07-25 LAB — BASIC METABOLIC PANEL
Anion gap: 4 — ABNORMAL LOW (ref 5–15)
BUN: 10 mg/dL (ref 6–20)
CO2: 27 mmol/L (ref 22–32)
Calcium: 8.2 mg/dL — ABNORMAL LOW (ref 8.9–10.3)
Chloride: 110 mmol/L (ref 98–111)
Creatinine, Ser: 0.74 mg/dL (ref 0.61–1.24)
GFR, Estimated: >60 mL/min (ref >60)
Glucose, Bld: 99 mg/dL (ref 70–99)
Potassium: 4.3 mmol/L (ref 3.5–5.1)
Sodium: 141 mmol/L (ref 135–145)

## 2021-07-25 LAB — SURGICAL PATHOLOGY

## 2021-07-25 MED ORDER — OXYCODONE HCL 5 MG PO TABS: 5.0000 mg | ORAL_TABLET | ORAL | 0 refills | Status: DC | PRN

## 2021-07-25 MED ORDER — IBUPROFEN 600 MG PO TABS: 600.0000 mg | ORAL_TABLET | Freq: Four times a day (QID) | ORAL | 0 refills | Status: DC | PRN

## 2021-07-25 MED ORDER — PREGABALIN 100 MG PO CAPS: 100.0000 mg | ORAL_CAPSULE | Freq: Three times a day (TID) | ORAL | 0 refills | Status: DC

## 2021-07-25 NOTE — TOC CM/SW Note (Signed)
Patient has orders to discharge home today. Chart reviewed. PCP is Tally Joe, FNP. On room air. No discharge wound care needs. No TOC needs identified. CSW signing off.  [name] [name], [place]

## 2021-07-25 NOTE — Discharge Summary (Signed)
[facility] SURGICAL DISCHARGE SUMMARY  Patient ID: [name] [name] MRN: [id number] DOB/AGE: 1October 22, 1962 61 y.o.  Admit date: 07/23/2021 Discharge date: 07/25/2021  Discharge Diagnoses Patient Active Problem List   Diagnosis Date Noted   S/P laparoscopic colectomy 07/23/2021    Consultants None  Procedures 07/23/2021:  Robotic assisted laparoscopic sigmoid colectomy with EEA  HPI: [name] [name] is a 61 y.o. male with a history of complicated diverticulitis who presents to [facility] on [facility] for scheduled surgery with Dr [name].   Hospital Course: Informed consent was obtained and documented, and patient underwent uneventful robotic assisted laparoscopic sigmoid colectomy with EEA (Dr [name], 07/23/2021).  Post-operatively, patient did extremely well. He had ROBF on POD1. Advancement of patient's diet and ambulation were well-tolerated. The remainder of patient's hospital course was essentially unremarkable, and discharge planning was initiated accordingly with patient safely able to be discharged home with appropriate discharge instruction, pain control, and outpatient follow-up after all of his questions were answered to his expressed satisfaction.   Discharge Condition: Good   Physical Examination:  Constitutional: alert, cooperative and no distress  Respiratory: breathing non-labored at rest  Cardiovascular: regular rate and sinus rhythm  Gastrointestinal: soft, non-tender, non-distended, no rebound/guarding Integumentary: Laparoscopic incisions are CDI with dermabond, no erythema or drainage    Allergies as of 07/25/2021       Reactions   Tylenol [acetaminophen] Other (See Comments)   Was told not to take d/t having hep c treatment   Celecoxib Anxiety   Zoloft [sertraline Hcl] Anxiety        Medication List     STOP taking these medications    bisacodyl 5 MG EC tablet Commonly known as: DULCOLAX   metroNIDAZOLE 500 MG tablet Commonly known as: FLAGYL    neomycin 500 MG tablet Commonly known as: MYCIFRADIN   polyethylene glycol powder 17 GM/SCOOP powder Commonly known as: MiraLax       TAKE these medications    busPIRone 7.5 MG tablet Commonly known as: BUSPAR Take 7.5 mg by mouth as needed.   ibuprofen 600 MG tablet Commonly known as: ADVIL Take 1 tablet (600 mg total) by mouth every 6 (six) hours as needed.   levothyroxine 50 MCG tablet Commonly known as: SYNTHROID Take 1 tablet (50 mcg total) by mouth daily.   lidocaine 5 % Commonly known as: Lidoderm Place 1 patch onto the skin every 12 (twelve) hours. Remove & Discard patch within 12 hours or as directed by MD   NON FORMULARY cpap device   omeprazole 20 MG capsule Commonly known as: PRILOSEC Take 20 mg by mouth daily.   oxyCODONE 5 MG immediate release tablet Commonly known as: Oxy IR/ROXICODONE Take 1-2 tablets (5-10 mg total) by mouth every 4 (four) hours as needed for moderate pain.   pregabalin 100 MG capsule Commonly known as: LYRICA Take 1 capsule (100 mg total) by mouth 3 (three) times daily for 14 days.   rosuvastatin 10 MG tablet Commonly known as: Crestor Take 1 tablet (10 mg total) by mouth daily.   tiZANidine 4 MG tablet Commonly known as: ZANAFLEX Take 4 mg by mouth every 6 (six) hours as needed for muscle spasms.          Follow-up Information     [name], [name], MD. Schedule an appointment as soon as possible for a visit in 2 week(s).   Specialty: General Surgery Why: s/p laparoscopic sigmoid colectomy Contact information: [address] [place] [address] [phone]  Time spent on discharge management including discussion of hospital course, clinical condition, outpatient instructions, prescriptions, and follow up with the patient and members of the medical team: >30 minutes  -- Lynden Oxford , PA-C [facility]  07/25/2021, 7:48 AM [phone] M-F: 7am -  4pm

## 2021-07-25 NOTE — Discharge Instructions (Signed)
In addition to included general post-operative instructions,  Diet: Resume home diet.   Activity: No heavy lifting >20 pounds (children, pets, laundry, garbage) for 6 weeks, but light activity and walking are encouraged. Do not drive or drink alcohol if taking narcotic pain medications or having pain that might distract from driving.  Wound care: You may shower/get incision wet with soapy water and pat dry (do not rub incisions), but no baths or submerging incision underwater until follow-up.   Medications: Resume all home medications. For mild to moderate pain: acetaminophen (Tylenol) or ibuprofen/naproxen (if no kidney disease). Combining Tylenol with alcohol can substantially increase your risk of causing liver disease. Narcotic pain medications, if prescribed, can be used for severe pain, though may cause nausea, constipation, and drowsiness. Do not combine Tylenol and Percocet (or similar) within a 6 hour period as Percocet (and similar) contain(s) Tylenol. If you do not need the narcotic pain medication, you do not need to fill the prescription.  Call office ([phone] / [phone]) at any time if any questions, worsening pain, fevers/chills, bleeding, drainage from incision site, or other concerns.  

## 2021-07-25 NOTE — Progress Notes (Signed)
Discharge instructions reviewed with patient and wife. Both state they have no further questions or concerns at this time . 2 PIVs removed with no complications. Patient is safe and stable to DC at this time

## 2021-07-31 NOTE — Progress Notes (Signed)
Patient had diverticulitis with abscess. Now with diverticulosis

## 2021-08-06 ENCOUNTER — Encounter: Payer: Self-pay | Admitting: Physician Assistant

## 2021-08-06 ENCOUNTER — Ambulatory Visit (INDEPENDENT_AMBULATORY_CARE_PROVIDER_SITE_OTHER): Payer: Medicaid Other | Admitting: Physician Assistant

## 2021-08-06 VITALS — BP 136/81 | HR 60 | Temp 98.4°F | Ht 73.0 in | Wt 331.0 lb

## 2021-08-06 DIAGNOSIS — K5732 Diverticulitis of large intestine without perforation or abscess without bleeding: Secondary | ICD-10-CM

## 2021-08-06 DIAGNOSIS — K5733 Diverticulitis of large intestine without perforation or abscess with bleeding: Secondary | ICD-10-CM

## 2021-08-06 DIAGNOSIS — Z9049 Acquired absence of other specified parts of digestive tract: Secondary | ICD-10-CM

## 2021-08-06 DIAGNOSIS — Z09 Encounter for follow-up examination after completed treatment for conditions other than malignant neoplasm: Secondary | ICD-10-CM

## 2021-08-06 DIAGNOSIS — K5792 Diverticulitis of intestine, part unspecified, without perforation or abscess without bleeding: Secondary | ICD-10-CM

## 2021-08-06 NOTE — Progress Notes (Signed)
[facility] POST-OP OFFICE VISIT  08/06/2021  HPI: [name] [name] is a 61 y.o. male 14 days s/p robotic assisted laparoscopic sigmoid colectomy and EEA for history of complicated diverticulitis with Dr [name]  He is doing well Still with abdominal soreness expectedly; using less narcotic pain medications this week; only as needed Some lower rectal/pelvic "pressure" difficult to explain but no true pain nor urgency or incontinence Strange sensation with urination but no pain or hematuria No fever, chills, nausea, emesis His stools are looser, "mashed potato consistency"; no diarrhea Incisions are healing well Tolerating PO Ambulating well  Vital signs: BP 136/81   Pulse 60   Temp 98.4 F (36.9 C)   Ht 6\' 1"  (1.854 m)   Wt (!) 331 lb (150.1 kg)   SpO2 98%   BMI 43.67 kg/m    Physical Exam: Constitutional: Well appearing male, NAD Abdomen: Soft, non-tender, non-distended, no rebound/guarding Skin: Laparoscopic incisions are healing well, no erythema or drainage   Assessment/Plan: This is a 61 y.o. male 14 days s/p robotic assisted laparoscopic sigmoid colectomy and EEA for history of complicated diverticulitis with Dr 41   - Pain control prn  - Reviewed wound care recommendation  - Reviewed lifting restrictions; 6 weeks total  - Reviewed surgical pathology; Diverticulosis, adhesions, lymph nodes 0/11, negative for malignancy  - I will see him again in 1 month. He understands he can call in the interim with any issues or concerns.   -- [id number], PA-C [facility] 08/06/2021, 3:02 PM M-F: 7am - 4pm

## 2021-08-06 NOTE — Patient Instructions (Addendum)
Please see your follow up appointment listed below.    GENERAL POST-OPERATIVE PATIENT INSTRUCTIONS   WOUND CARE INSTRUCTIONS: Try to keep the wound dry and avoid ointments on the wound unless directed to do so.  If the wound becomes bright red and painful or starts to drain infected material that is not clear, please contact your physician immediately.  If the wound is mildly pink and has a thick firm ridge underneath it, this is normal, and is referred to as a healing ridge.  This will resolve over the next 4-6 weeks.  BATHING: You may shower if you have been informed of this by your surgeon. However, Please do not submerge in a tub, hot tub, or pool until incisions are completely sealed or have been told by your surgeon that you may do so.  DIET:  You may eat any foods that you can tolerate.  It is a good idea to eat a high fiber diet and take in plenty of fluids to prevent constipation.  If you do become constipated you may want to take a mild laxative or take ducolax tablets on a daily basis until your bowel habits are regular.  Constipation can be very uncomfortable, along with straining, after recent surgery.  ACTIVITY:  You may want to hug a pillow when coughing and sneezing to add additional support to the surgical area, if you had abdominal or chest surgery, which will decrease pain during these times.  You are encouraged to walk and engage in light activity for the next two weeks.  You should not lift more than 20 pounds for 6 weeks total after surgery as it could put you at increased risk for complications.  Twenty pounds is roughly equivalent to a plastic bag of groceries. At that time- Listen to your body when lifting, if you have pain when lifting, stop and then try again in a few days. Soreness after doing exercises or activities of daily living is normal as you get back in to your normal routine.  MEDICATIONS:  Try to take narcotic medications and anti-inflammatory medications, such  as tylenol, ibuprofen, naprosyn, etc., with food.  This will minimize stomach upset from the medication.  Should you develop nausea and vomiting from the pain medication, or develop a rash, please discontinue the medication and contact your physician.  You should not drive, make important decisions, or operate machinery when taking narcotic pain medication.  SUNBLOCK Use sun block to incision area over the next year if this area will be exposed to sun. This helps decrease scarring and will allow you avoid a permanent darkened area over your incision.  QUESTIONS:  Please feel free to call our office if you have any questions, and we will be glad to assist you. [phone]

## 2021-08-12 ENCOUNTER — Ambulatory Visit: Payer: Self-pay

## 2021-08-12 ENCOUNTER — Telehealth: Payer: Self-pay | Admitting: *Deleted

## 2021-08-12 DIAGNOSIS — S30861A Insect bite (nonvenomous) of abdominal wall, initial encounter: Secondary | ICD-10-CM | POA: Diagnosis not present

## 2021-08-12 DIAGNOSIS — Z8614 Personal history of Methicillin resistant Staphylococcus aureus infection: Secondary | ICD-10-CM | POA: Diagnosis not present

## 2021-08-12 DIAGNOSIS — W57XXXA Bitten or stung by nonvenomous insect and other nonvenomous arthropods, initial encounter: Secondary | ICD-10-CM | POA: Diagnosis not present

## 2021-08-12 DIAGNOSIS — Z9049 Acquired absence of other specified parts of digestive tract: Secondary | ICD-10-CM | POA: Diagnosis not present

## 2021-08-13 NOTE — Progress Notes (Signed)
Established patient visit   Patient: [name] [name]   DOB: 1October 09, 1962   61 y.o. Male  MRN: [id number] Visit Date: 08/14/2021  Today's healthcare provider: Jacky Kindle, FNP  Introduced to nurse practitioner role and practice setting.  All questions answered.  Discussed provider/patient relationship and expectations.  Patient presents with wife, [name] as a scribe for Jacky Kindle, FNP.,have documented all relevant documentation on the behalf of Jacky Kindle, FNP,as directed by  Jacky Kindle, FNP while in the presence of Jacky Kindle, FNP.   Chief Complaint  Patient presents with   Insect Bite    Patient complains of a tick bite that he found 5 days ago. Was treated at urgent care on doxy and bacitracin. States it is better now, but still wants to have it looked at.    Subjective    HPI HPI     Insect Bite    Additional comments: Patient complains of a tick bite that he found 5 days ago. Was treated at urgent care on doxy and bacitracin. States it is better now, but still wants to have it looked at.       Last edited by Marlana Salvage, CMA on 08/14/2021  1:11 PM.       Medications: Outpatient Medications Prior to Visit  Medication Sig   busPIRone (BUSPAR) 7.5 MG tablet Take 7.5 mg by mouth as needed.   doxycycline (VIBRA-TABS) 100 MG tablet Take 100 mg by mouth 2 (two) times daily.   ibuprofen (ADVIL) 600 MG tablet Take 1 tablet (600 mg total) by mouth every 6 (six) hours as needed.   levothyroxine (SYNTHROID) 50 MCG tablet Take 1 tablet (50 mcg total) by mouth daily.   lidocaine (LIDODERM) 5 % Place 1 patch onto the skin every 12 (twelve) hours. Remove & Discard patch within 12 hours or as directed by MD   NON FORMULARY cpap device   omeprazole (PRILOSEC) 20 MG capsule Take 20 mg by mouth daily.   oxyCODONE (OXY IR/ROXICODONE) 5 MG immediate release tablet Take 1-2 tablets (5-10 mg total) by mouth every 4 (four) hours as needed for  moderate pain.   rosuvastatin (CRESTOR) 10 MG tablet Take 1 tablet (10 mg total) by mouth daily.   tiZANidine (ZANAFLEX) 4 MG tablet Take 4 mg by mouth every 6 (six) hours as needed for muscle spasms.   pregabalin (LYRICA) 100 MG capsule Take 1 capsule (100 mg total) by mouth 3 (three) times daily for 14 days.   No facility-administered medications prior to visit.    Review of Systems     Objective    BP 122/80 (BP Location: Left Arm, Patient Position: Sitting, Cuff Size: Large)   Temp 98.1 F (36.7 C) (Oral)   Resp 16   Ht 6\' 1"  (1.854 m)   Wt (!) 331 lb 3.2 oz (150.2 kg)   SpO2 98%   BMI 43.70 kg/m    Physical Exam Vitals and nursing note reviewed.  Constitutional:      Appearance: Normal appearance. He is obese.  HENT:     Head: Normocephalic and atraumatic.  Eyes:     Pupils: Pupils are equal, round, and reactive to light.  Cardiovascular:     Rate and Rhythm: Normal rate and regular rhythm.     Pulses: Normal pulses.     Heart sounds: Normal heart sounds.  Pulmonary:     Effort: Pulmonary effort is normal.  Breath sounds: Normal breath sounds.  Musculoskeletal:        General: Normal range of motion.     Cervical back: Normal range of motion.  Skin:    General: Skin is warm and dry.     Capillary Refill: Capillary refill takes less than 2 seconds.     Findings: Erythema present.          Comments: S/p L abdominal/flank tick bite  Neurological:     General: No focal deficit present.     Mental Status: He is alert and oriented to person, place, and time. Mental status is at baseline.      No results found for any visits on 08/14/21.  Assessment & Plan     Problem List Items Addressed This Visit       Musculoskeletal and Integument   Tick bite of abdominal wall - Primary    Chronic, improved No longer fevering, TMax <100F Site improving with current regimen, started at Urgent care On 14 day course of doxy Hx of MRSA; no flares in >4 years Also  using topical bacitracin Continue to monitor site Return to clinic in 2 weeks if necessary        Return in about 2 weeks (around 08/28/2021), or tick bite.      Leilani Merl, FNP, have reviewed all documentation for this visit. The documentation on 08/14/21 for the exam, diagnosis, procedures, and orders are all accurate and complete.    Jacky Kindle, FNP  [facility] [phone] (phone) [phone] (fax)  [facility]

## 2021-08-14 ENCOUNTER — Ambulatory Visit (INDEPENDENT_AMBULATORY_CARE_PROVIDER_SITE_OTHER): Payer: Medicare Other | Admitting: Family Medicine

## 2021-08-14 ENCOUNTER — Encounter: Payer: Self-pay | Admitting: Family Medicine

## 2021-08-14 VITALS — BP 122/80 | Temp 98.1°F | Resp 16 | Ht 73.0 in | Wt 331.2 lb

## 2021-08-14 DIAGNOSIS — W57XXXD Bitten or stung by nonvenomous insect and other nonvenomous arthropods, subsequent encounter: Secondary | ICD-10-CM | POA: Diagnosis not present

## 2021-08-14 DIAGNOSIS — W57XXXA Bitten or stung by nonvenomous insect and other nonvenomous arthropods, initial encounter: Secondary | ICD-10-CM | POA: Insufficient documentation

## 2021-08-14 DIAGNOSIS — S30861A Insect bite (nonvenomous) of abdominal wall, initial encounter: Secondary | ICD-10-CM | POA: Insufficient documentation

## 2021-08-14 DIAGNOSIS — S30861D Insect bite (nonvenomous) of abdominal wall, subsequent encounter: Secondary | ICD-10-CM

## 2021-08-14 NOTE — Assessment & Plan Note (Signed)
Chronic, improved No longer fevering, TMax <100F Site improving with current regimen, started at Urgent care On 14 day course of doxy Hx of MRSA; no flares in >4 years Also using topical bacitracin Continue to monitor site Return to clinic in 2 weeks if necessary

## 2021-08-15 ENCOUNTER — Ambulatory Visit (INDEPENDENT_AMBULATORY_CARE_PROVIDER_SITE_OTHER): Payer: Medicaid Other | Admitting: Physician Assistant

## 2021-08-15 ENCOUNTER — Encounter: Payer: Self-pay | Admitting: Physician Assistant

## 2021-08-15 VITALS — BP 122/78 | HR 77 | Temp 97.8°F | Wt 327.8 lb

## 2021-08-15 DIAGNOSIS — K5733 Diverticulitis of large intestine without perforation or abscess with bleeding: Secondary | ICD-10-CM

## 2021-08-15 DIAGNOSIS — Z9049 Acquired absence of other specified parts of digestive tract: Secondary | ICD-10-CM

## 2021-08-15 DIAGNOSIS — Z09 Encounter for follow-up examination after completed treatment for conditions other than malignant neoplasm: Secondary | ICD-10-CM

## 2021-08-15 MED ORDER — PREGABALIN 100 MG PO CAPS: 100.0000 mg | ORAL_CAPSULE | Freq: Three times a day (TID) | ORAL | 0 refills | Status: DC

## 2021-08-15 MED ORDER — OXYCODONE HCL 5 MG PO TABS: 5.0000 mg | ORAL_TABLET | Freq: Four times a day (QID) | ORAL | 0 refills | Status: DC | PRN

## 2021-08-15 NOTE — Patient Instructions (Signed)

## 2021-08-15 NOTE — Progress Notes (Signed)
[facility] POST-OP OFFICE VISIT  08/15/2021  HPI: [name] [name] is a 61 y.o. male 23 days s/p robotic assisted laparoscopic sigmoid colectomy and EEA for history of complicated diverticulitis with Dr [name]  He reports he still has a "pressure sensation" in his deep pelvis/rectum. No real pain in the area but it can be uncomfortable Still with abdominal soreness Bowel movements are smaller than previous; still passing flatus; no distension No fever, chills, nausea He continues to have episodes of emesis in the mornings; which was present pre-operatively Of note, he recently had a tick bite on his abdomen; seen by PCP for this and started on Doxycycline; no issues so far Tolerating PO but not eating as much Incisions continue to heal well  Vital signs: BP 122/78   Pulse 77   Temp 97.8 F (36.6 C) (Oral)   Wt (!) 327 lb 12.8 oz (148.7 kg)   SpO2 96%   BMI 43.25 kg/m    Physical Exam: Constitutional: Well appearing male, NAD Abdomen: Soft, mild incisional soreness, non-distended, no rebound/guarding Skin: Laparoscopic incisions are well healed, no erythema, no drainage. No appreciable rashes  Assessment/Plan: This is a 61 y.o. male 23 days s/p robotic assisted laparoscopic sigmoid colectomy and EEA for history of complicated diverticulitis with Dr [name]   - Pain control prn; I will send a small refill of his narcotic pain prescription one more time. He and his wife understand this will be the last one baring significant changes. Wife states they have an upcoming pain management appointment "once he is all healed."  - Okay to continue bowel regimen; suspect his bowel movements may be smaller given his decreased appetite and sticking with softer/liquid foods. Reviewed signs and symptoms for concerning complication including fever, severe pain, decreased bowel function, distension. Can consider KUB for peace of mind as well if needed.   - Continue Abx per PCP for tick  bite  - Reviewed wound care recommendation  - Reviewed lifting restrictions; 6 weeks total  - I will see him again as scheduled on 07/18; He, and his wife, understands to call with questions/concerns  -- Lynden Oxford, PA-C [facility] 08/15/2021, 4:07 PM M-F: 7am - 4pm

## 2021-08-22 ENCOUNTER — Telehealth: Payer: Self-pay | Admitting: Family Medicine

## 2021-08-22 NOTE — Telephone Encounter (Signed)
Copied from CRM [id number]. Topic: Medicare AWV >> Aug 22, 2021 10:25 AM Zannie Kehr wrote: Reason for CRM:  Left message for patient to call back and schedule Medicare Annual Wellness Visit (AWV) in office.   If unable to come into the office for AWV,  please offer to do virtually or by telephone.  Last AWV: 01/19/2020  Please schedule at anytime with [facility] Health Advisor.  30 minute appointment for Virtual or phone 45 minute appointment for in office or Initial virtual/phone  Any questions, please contact me at [phone]

## 2021-08-26 NOTE — Progress Notes (Deleted)
      Established patient visit   Patient: [name] [name]   DOB: 103/21/1962   61 y.o. Male  MRN: [id number] Visit Date: 08/28/2021  Today's healthcare provider: Jacky Kindle, FNP   No chief complaint on file.  Subjective    HPI  ***  Medications: Outpatient Medications Prior to Visit  Medication Sig   busPIRone (BUSPAR) 7.5 MG tablet Take 7.5 mg by mouth as needed.   doxycycline (VIBRA-TABS) 100 MG tablet Take 100 mg by mouth 2 (two) times daily.   levothyroxine (SYNTHROID) 50 MCG tablet Take 1 tablet (50 mcg total) by mouth daily.   lidocaine (LIDODERM) 5 % Place 1 patch onto the skin every 12 (twelve) hours. Remove & Discard patch within 12 hours or as directed by MD   NON FORMULARY cpap device   omeprazole (PRILOSEC) 20 MG capsule Take 20 mg by mouth daily.   oxyCODONE (OXY IR/ROXICODONE) 5 MG immediate release tablet Take 1 tablet (5 mg total) by mouth every 6 (six) hours as needed for breakthrough pain or severe pain.   pregabalin (LYRICA) 100 MG capsule Take 1 capsule (100 mg total) by mouth 3 (three) times daily for 14 days.   rosuvastatin (CRESTOR) 10 MG tablet Take 1 tablet (10 mg total) by mouth daily.   tiZANidine (ZANAFLEX) 4 MG tablet Take 4 mg by mouth every 6 (six) hours as needed for muscle spasms.   No facility-administered medications prior to visit.    Review of Systems  {Labs  Heme  Chem  Endocrine  Serology  Results Review (optional):23779}   Objective    There were no vitals taken for this visit. {Show previous vital signs (optional):23777}  Physical Exam  ***  No results found for any visits on 08/28/21.  Assessment & Plan     ***  No follow-ups on file.      {provider attestation***:1}   Jacky Kindle, FNP  [facility] [phone] (phone) [phone] (fax)  [facility]

## 2021-08-28 ENCOUNTER — Ambulatory Visit: Payer: Medicare Other | Admitting: Family Medicine

## 2021-09-03 ENCOUNTER — Encounter: Payer: Medicaid Other | Admitting: Physician Assistant

## 2021-09-09 NOTE — Progress Notes (Deleted)
Annual Wellness Visit     Patient: [name] [name], Male    DOB: Nov 16, 1960, 61 y.o.   MRN: [id number] Visit Date: 09/10/2021  Today's Provider: Jacky Kindle, FNP   No chief complaint on file.  Subjective    [name] [name] is a 61 y.o. male who presents today for his Annual Wellness Visit. He reports consuming a {diet types:17450} diet. {Exercise:19826} He generally feels {well/fairly well/poorly:18703}. He reports sleeping {well/fairly well/poorly:18703}. He {does/does not:200015} have additional problems to discuss today.   HPI    Medications: Outpatient Medications Prior to Visit  Medication Sig   busPIRone (BUSPAR) 7.5 MG tablet Take 7.5 mg by mouth as needed.   doxycycline (VIBRA-TABS) 100 MG tablet Take 100 mg by mouth 2 (two) times daily.   levothyroxine (SYNTHROID) 50 MCG tablet Take 1 tablet (50 mcg total) by mouth daily.   lidocaine (LIDODERM) 5 % Place 1 patch onto the skin every 12 (twelve) hours. Remove & Discard patch within 12 hours or as directed by MD   NON FORMULARY cpap device   omeprazole (PRILOSEC) 20 MG capsule Take 20 mg by mouth daily.   oxyCODONE (OXY IR/ROXICODONE) 5 MG immediate release tablet Take 1 tablet (5 mg total) by mouth every 6 (six) hours as needed for breakthrough pain or severe pain.   pregabalin (LYRICA) 100 MG capsule Take 1 capsule (100 mg total) by mouth 3 (three) times daily for 14 days.   rosuvastatin (CRESTOR) 10 MG tablet Take 1 tablet (10 mg total) by mouth daily.   tiZANidine (ZANAFLEX) 4 MG tablet Take 4 mg by mouth every 6 (six) hours as needed for muscle spasms.   No facility-administered medications prior to visit.    Allergies  Allergen Reactions   Tylenol [Acetaminophen] Other (See Comments)    Was told not to take d/t having hep c treatment   Celecoxib Anxiety   Zoloft [Sertraline Hcl] Anxiety    Patient Care Team: Jacky Kindle, FNP as PCP - General (Family Medicine) End, Cristal Deer, MD as PCP - Cardiology  (Cardiology) Monika Salk, [facility] as Pharmacist (Pharmacist)  Review of Systems  {Labs  Heme  Chem  Endocrine  Serology  Results Review (optional):23779}    Objective    Vitals: There were no vitals taken for this visit. {Show previous vital signs (optional):23777}   Physical Exam ***  Most recent functional status assessment:    08/14/2021    1:17 PM  In your present state of health, do you have any difficulty performing the following activities:  Hearing? 0  Vision? 0  Difficulty concentrating or making decisions? 0  Walking or climbing stairs? 0  Doing errands, shopping? 0   Most recent fall risk assessment:    08/14/2021    1:16 PM  Fall Risk   Falls in the past year? 1  Number falls in past yr: 0  Injury with Fall? 0    Most recent depression screenings:    08/14/2021    1:17 PM 06/12/2021    2:15 PM  PHQ 2/9 Scores  PHQ - 2 Score 2 2  PHQ- 9 Score 8 4   Most recent cognitive screening:     No data to display         Most recent Audit-C alcohol use screening    08/14/2021    1:17 PM  Alcohol Use Disorder Test (AUDIT)  1. How often do you have a drink containing alcohol? 0  2. How many drinks containing  alcohol do you have on a typical day when you are drinking? 0  3. How often do you have six or more drinks on one occasion? 0  AUDIT-C Score 0   A score of 3 or more in women, and 4 or more in men indicates increased risk for alcohol abuse, EXCEPT if all of the points are from question 1   No results found for any visits on 09/10/21.  Assessment & Plan     Annual wellness visit done today including the all of the following: Reviewed patient's Family Medical History Reviewed and updated list of patient's medical providers Assessment of cognitive impairment was done Assessed patient's functional ability Established a written schedule for health screening services Health Risk Assessent Completed and Reviewed  Exercise Activities and  Dietary recommendations  Goals      Manage Chronic Pain     Timeframe:  Long-Range Goal Priority:  High Start Date:      01/16/21                       Expected End Date:  01/16/22                     Follow Up Date 05/29/21    - call for medicine refill 2 or 3 days before it runs out - plan exercise or activity when pain is best controlled - track times pain is worst and when it is best - track what makes the pain worse and what makes it better - use ice or heat for pain relief    Why is this important?   Day-to-day life can be hard when you have chronic pain.  Pain medicine is just one piece of the treatment puzzle.  You can try these action steps to help you manage your pain.    Notes:      Track and Manage My Symptoms-Depression     Timeframe:  Long-Range Goal Priority:  High Start Date:   01/16/21                          Expected End Date:    01/16/22                   Follow Up Date 05/29/21    - avoid negative self-talk - exercise at least 2 to 3 times per week - have a plan for how to handle bad days - journal feelings and what helps to feel better or worse - spend time or talk with others at least 2 to 3 times per week    Why is this important?   Keeping track of your progress will help your treatment team find the right mix of medicine and therapy for you.  Write in your journal every day.  Day-to-day changes in depression symptoms are normal. It may be more helpful to check your progress at the end of each week instead of every day.     Notes:         Immunization History  Administered Date(s) Administered   Influenza Inj Mdck Quad Pf 12/06/2019    Health Maintenance  Topic Date Due   COVID-19 Vaccine (1) Never done   TETANUS/TDAP  Never done   Zoster Vaccines- Shingrix (1 of 2) Never done   INFLUENZA VACCINE  09/17/2021   COLONOSCOPY (Pts 45-28yrs Insurance coverage will need to be confirmed)  02/02/2024   Hepatitis C  Screening  Completed   HIV  Screening  Completed   HPV VACCINES  Aged Out     Discussed health benefits of physical activity, and encouraged him to engage in regular exercise appropriate for his age and condition.    ***  No follow-ups on file.     {provider attestation***:1}   Jacky Kindle, FNP  [facility] [phone] (phone) [phone] (fax)  [facility]

## 2021-09-10 ENCOUNTER — Encounter: Payer: Medicare Other | Admitting: Family Medicine

## 2021-09-11 DIAGNOSIS — G4733 Obstructive sleep apnea (adult) (pediatric): Secondary | ICD-10-CM | POA: Diagnosis not present

## 2021-09-24 ENCOUNTER — Telehealth: Payer: Self-pay | Admitting: Family Medicine

## 2021-09-24 NOTE — Telephone Encounter (Signed)
Copied from CRM [id number]. Topic: Medicare AWV >> Sep 24, 2021 10:09 AM Zannie Kehr wrote: Reason for CRM:  No answer unable to leave a message for patient to call back and schedule Medicare Annual Wellness Visit (AWV) in office.   If unable to come into the office for AWV,  please offer to do virtually or by telephone.  Last AWV: 01/19/2020  Please schedule at anytime with [facility] Health Advisor.  30 minute appointment for Virtual or phone 45 minute appointment for in office or Initial virtual/phone  Any questions, please contact me at [phone]

## 2021-10-05 DIAGNOSIS — G4733 Obstructive sleep apnea (adult) (pediatric): Secondary | ICD-10-CM | POA: Diagnosis not present

## 2021-10-07 ENCOUNTER — Other Ambulatory Visit: Payer: Self-pay | Admitting: Family Medicine

## 2021-10-07 DIAGNOSIS — E039 Hypothyroidism, unspecified: Secondary | ICD-10-CM

## 2021-10-07 NOTE — Progress Notes (Unsigned)
Established patient visit   Patient: [name] [name]   DOB: 19-Feb-1960   61 y.o. Male  MRN: [id number] Visit Date: 10/09/2021  Today's healthcare provider: Jacky Kindle, FNP RE Introduced to nurse practitioner role and practice setting.  All questions answered.  Discussed provider/patient relationship and expectations.   I,Ulas Zuercher J Jerald Hennington,acting as a scribe for Jacky Kindle, FNP.,have documented all relevant documentation on the behalf of Jacky Kindle, FNP,as directed by  Jacky Kindle, FNP while in the presence of Jacky Kindle, FNP.   Chief Complaint  Patient presents with   Hypothyroidism   Anxiety    Patient complains of not sleeping well, waking up early from anxiety, worsening in the past week. Possibly wants a psych referral.    Hyperlipidemia   Subjective    HPI HPI     Anxiety    Additional comments: Patient complains of not sleeping well, waking up early from anxiety, worsening in the past week. Possibly wants a psych referral.       Last edited by Marlana Salvage, CMA on 10/09/2021  1:33 PM.      Hypothyroid, follow-up  Lab Results  Component Value Date   TSH 0.405 (L) 06/12/2021   TSH 0.606 01/25/2020   TSH 1.040 04/11/2019   FREET4 1.41 06/12/2021   FREET4 1.14 01/25/2020    Wt Readings from Last 3 Encounters:  10/09/21 (!) 333 lb (151 kg)  08/15/21 (!) 327 lb 12.8 oz (148.7 kg)  08/14/21 (!) 331 lb 3.2 oz (150.2 kg)    He was last seen for hypothyroid 4 months ago.  Management since that visit includes continue synthroid. He reports excellent compliance with treatment. He is not having side effects.   Symptoms: Yes change in energy level No constipation  No diarrhea No heat / cold intolerance  Yes nervousness Yes palpitations  No weight changes    Lipid/Cholesterol, Follow-up  Last lipid panel Other pertinent labs  Lab Results  Component Value Date   CHOL 208 (H) 06/12/2021   HDL 39 (L) 06/12/2021   LDLCALC 149 (H) 06/12/2021    TRIG 112 06/12/2021   CHOLHDL 5.3 (H) 06/12/2021   Lab Results  Component Value Date   ALT 34 06/12/2021   AST 34 06/12/2021   PLT 183 07/25/2021   TSH 0.405 (L) 06/12/2021     He was last seen for this 4 months ago.  Management since that visit includes started on crestor 10mg .  He reports excellent compliance with treatment. He is not having side effects.   Symptoms: No chest pain No chest pressure/discomfort  No dyspnea No lower extremity edema  No numbness or tingling of extremity No orthopnea  Yes palpitations No paroxysmal nocturnal dyspnea  No speech difficulty No syncope   Current diet: well balanced Current exercise: bicycling  The 10-year ASCVD risk score (Arnett DK, et al., 2019) is: 8%     10/09/2021    1:41 PM  GAD 7 : Generalized Anxiety Score  Nervous, Anxious, on Edge 3  Control/stop worrying 2  Worry too much - different things 2  Trouble relaxing 1  Restless 1  Easily annoyed or irritable 2  Afraid - awful might happen 2  Total GAD 7 Score 13  Anxiety Difficulty Very difficult     ---------------------------------------------------------------------------------------------------  -----------------------------------------------------------------------------------------   Medications: Outpatient Medications Prior to Visit  Medication Sig   busPIRone (BUSPAR) 7.5 MG tablet Take 7.5 mg by mouth as needed.  levothyroxine (SYNTHROID) 50 MCG tablet TAKE 1 TABLET(50 MCG) BY MOUTH DAILY   NON FORMULARY cpap device   omeprazole (PRILOSEC) 20 MG capsule Take 20 mg by mouth daily.   rosuvastatin (CRESTOR) 10 MG tablet Take 1 tablet (10 mg total) by mouth daily.   doxycycline (VIBRA-TABS) 100 MG tablet Take 100 mg by mouth 2 (two) times daily.   lidocaine (LIDODERM) 5 % Place 1 patch onto the skin every 12 (twelve) hours. Remove & Discard patch within 12 hours or as directed by MD (Patient not taking: Reported on 10/09/2021)   oxyCODONE (OXY IR/ROXICODONE)  5 MG immediate release tablet Take 1 tablet (5 mg total) by mouth every 6 (six) hours as needed for breakthrough pain or severe pain.   pregabalin (LYRICA) 100 MG capsule Take 1 capsule (100 mg total) by mouth 3 (three) times daily for 14 days.   tiZANidine (ZANAFLEX) 4 MG tablet Take 4 mg by mouth every 6 (six) hours as needed for muscle spasms. (Patient not taking: Reported on 10/09/2021)   No facility-administered medications prior to visit.    Review of Systems    Objective    BP 118/81 (BP Location: Left Arm, Patient Position: Sitting, Cuff Size: Large)   Pulse 74   Temp 97.6 F (36.4 C) (Oral)   Resp 16   Ht 6\' 1"  (1.854 m)   Wt (!) 333 lb (151 kg)   SpO2 98%   BMI 43.93 kg/m   Physical Exam Vitals and nursing note reviewed.  Constitutional:      Appearance: Normal appearance. He is obese.  HENT:     Head: Normocephalic and atraumatic.  Eyes:     Pupils: Pupils are equal, round, and reactive to light.  Cardiovascular:     Rate and Rhythm: Normal rate and regular rhythm.     Pulses: Normal pulses.     Heart sounds: Normal heart sounds.  Pulmonary:     Effort: Pulmonary effort is normal.     Breath sounds: Normal breath sounds.  Musculoskeletal:        General: Normal range of motion.     Cervical back: Normal range of motion.  Skin:    General: Skin is warm and dry.     Capillary Refill: Capillary refill takes less than 2 seconds.  Neurological:     General: No focal deficit present.     Mental Status: He is alert and oriented to person, place, and time. Mental status is at baseline.  Psychiatric:        Mood and Affect: Mood normal.        Behavior: Behavior normal.        Thought Content: Thought content normal. Thought content does not include homicidal or suicidal ideation. Thought content does not include homicidal or suicidal plan.        Judgment: Judgment normal.     No results found for any visits on 10/09/21.  Assessment & Plan     Problem List  Items Addressed This Visit       Endocrine   Hypothyroidism    Chronic, previously over corrected 0.405 Repeat today  Reports worsening anxiety and insomnia symptoms  On 50 mcg synthroid previously       Relevant Orders   TSH + free T4     Other   Binge eating    Chronic, stable Trial of wellbutrin/phenteremine to assist Encourage 3 month f/u       Relevant Medications   buPROPion ER (  WELLBUTRIN SR) 100 MG 12 hr tablet   phentermine 37.5 MG capsule   Depression, recurrent (HCC) - Primary    Chronic, worsening Request follow up with psychiatry/psychology Does not want to start additional medication for mood adjustment at this time Currently on Buspar 7.5 mg PRN      Relevant Medications   buPROPion ER (WELLBUTRIN SR) 100 MG 12 hr tablet   Other Relevant Orders   Ambulatory referral to Psychiatry   Ambulatory referral to Psychology   Morbid obesity (HCC)    Chronic, stable Wishes to lose weight to assist with playing/involvement with son who will be 5 in September Body mass index is 43.93 kg/m. Discussed importance of healthy weight management Discussed diet and exercise       Relevant Medications   buPROPion ER (WELLBUTRIN SR) 100 MG 12 hr tablet   phentermine 37.5 MG capsule    Return in about 3 months (around 01/09/2022) for obsity mgmt.      Leilani Merl, FNP, have reviewed all documentation for this visit. The documentation on 10/09/21 for the exam, diagnosis, procedures, and orders are all accurate and complete.    Jacky Kindle, FNP  [facility] [phone] (phone) [phone] (fax)  [facility]

## 2021-10-09 ENCOUNTER — Ambulatory Visit (INDEPENDENT_AMBULATORY_CARE_PROVIDER_SITE_OTHER): Payer: Medicare Other | Admitting: Family Medicine

## 2021-10-09 ENCOUNTER — Encounter: Payer: Self-pay | Admitting: Family Medicine

## 2021-10-09 VITALS — BP 118/81 | HR 74 | Temp 97.6°F | Resp 16 | Ht 73.0 in | Wt 333.0 lb

## 2021-10-09 DIAGNOSIS — R632 Polyphagia: Secondary | ICD-10-CM | POA: Diagnosis not present

## 2021-10-09 DIAGNOSIS — F339 Major depressive disorder, recurrent, unspecified: Secondary | ICD-10-CM | POA: Diagnosis not present

## 2021-10-09 DIAGNOSIS — E039 Hypothyroidism, unspecified: Secondary | ICD-10-CM | POA: Diagnosis not present

## 2021-10-09 MED ORDER — BUPROPION HCL ER (SR) 100 MG PO TB12: 100.0000 mg | ORAL_TABLET | Freq: Two times a day (BID) | ORAL | 0 refills | Status: DC

## 2021-10-09 MED ORDER — PHENTERMINE HCL 37.5 MG PO CAPS: 37.5000 mg | ORAL_CAPSULE | ORAL | 0 refills | Status: DC

## 2021-10-09 NOTE — Assessment & Plan Note (Signed)
Chronic, stable Trial of wellbutrin/phenteremine to assist Encourage 3 month f/u

## 2021-10-09 NOTE — Assessment & Plan Note (Signed)
Chronic, previously over corrected 0.405 Repeat today  Reports worsening anxiety and insomnia symptoms  On 50 mcg synthroid previously

## 2021-10-09 NOTE — Assessment & Plan Note (Signed)
Chronic, stable Wishes to lose weight to assist with playing/involvement with son who will be 5 in September Body mass index is 43.93 kg/m. Discussed importance of healthy weight management Discussed diet and exercise

## 2021-10-09 NOTE — Assessment & Plan Note (Signed)
Chronic, worsening Request follow up with psychiatry/psychology Does not want to start additional medication for mood adjustment at this time Currently on Buspar 7.5 mg PRN

## 2021-10-10 LAB — TSH+FREE T4
Free T4: 1.01 ng/dL (ref 0.82–1.77)
TSH: 0.684 u[IU]/mL (ref 0.450–4.500)

## 2021-10-26 ENCOUNTER — Other Ambulatory Visit: Payer: Self-pay

## 2021-10-26 ENCOUNTER — Emergency Department
Admission: EM | Admit: 2021-10-26 | Discharge: 2021-10-27 | Disposition: A | Discharge status: Home / Self Care | Payer: Medicare Other | Attending: Emergency Medicine | Admitting: Emergency Medicine

## 2021-10-26 ENCOUNTER — Emergency Department: Payer: Medicare Other

## 2021-10-26 ENCOUNTER — Encounter: Payer: Self-pay | Admitting: Emergency Medicine

## 2021-10-26 DIAGNOSIS — N410 Acute prostatitis: Secondary | ICD-10-CM | POA: Diagnosis not present

## 2021-10-26 DIAGNOSIS — R1030 Lower abdominal pain, unspecified: Secondary | ICD-10-CM

## 2021-10-26 DIAGNOSIS — R109 Unspecified abdominal pain: Secondary | ICD-10-CM | POA: Diagnosis not present

## 2021-10-26 LAB — CBC WITH DIFFERENTIAL/PLATELET
Abs Immature Granulocytes: 0.03 10*3/uL (ref 0.00–0.07)
Basophils Absolute: 0 10*3/uL (ref 0.0–0.1)
Basophils Relative: 0 %
Eosinophils Absolute: 0.1 10*3/uL (ref 0.0–0.5)
Eosinophils Relative: 2 %
HCT: 45.3 % (ref 39.0–52.0)
Hemoglobin: 14.9 g/dL (ref 13.0–17.0)
Immature Granulocytes: 0 %
Lymphocytes Relative: 24 %
Lymphs Abs: 1.6 10*3/uL (ref 0.7–4.0)
MCH: 28.5 pg (ref 26.0–34.0)
MCHC: 32.9 g/dL (ref 30.0–36.0)
MCV: 86.8 fL (ref 80.0–100.0)
Monocytes Absolute: 0.8 10*3/uL (ref 0.1–1.0)
Monocytes Relative: 13 %
Neutro Abs: 3.9 10*3/uL (ref 1.7–7.7)
Neutrophils Relative %: 61 %
Platelets: 178 10*3/uL (ref 150–400)
RBC: 5.22 MIL/uL (ref 4.22–5.81)
RDW: 12.5 % (ref 11.5–15.5)
WBC: 6.3 10*3/uL (ref 4.0–10.5)
nRBC: 0 % (ref 0.0–0.2)
nRBC: 0 /100 WBC

## 2021-10-26 LAB — COMPREHENSIVE METABOLIC PANEL
ALT: 28 U/L (ref 0–44)
AST: 29 U/L (ref 15–41)
Albumin: 4 g/dL (ref 3.5–5.0)
Alkaline Phosphatase: 74 U/L (ref 38–126)
Anion gap: 8 (ref 5–15)
BUN: 17 mg/dL (ref 6–20)
CO2: 26 mmol/L (ref 22–32)
Calcium: 9.2 mg/dL (ref 8.9–10.3)
Chloride: 105 mmol/L (ref 98–111)
Creatinine, Ser: 0.85 mg/dL (ref 0.61–1.24)
GFR, Estimated: >60 mL/min (ref >60)
Glucose, Bld: 139 mg/dL — ABNORMAL HIGH (ref 70–99)
Potassium: 3.8 mmol/L (ref 3.5–5.1)
Sodium: 139 mmol/L (ref 135–145)
Total Bilirubin: 0.8 mg/dL (ref 0.3–1.2)
Total Protein: 8.4 g/dL — ABNORMAL HIGH (ref 6.5–8.1)

## 2021-10-26 LAB — CBC
HCT: 45.6 % (ref 39.0–52.0)
Hemoglobin: 15.1 g/dL (ref 13.0–17.0)
MCH: 29 pg (ref 26.0–34.0)
MCHC: 33.1 g/dL (ref 30.0–36.0)
MCV: 87.5 fL (ref 80.0–100.0)
Platelets: 160 10*3/uL (ref 150–400)
RBC: 5.21 MIL/uL (ref 4.22–5.81)
RDW: 12.4 % (ref 11.5–15.5)
WBC: 6.4 10*3/uL (ref 4.0–10.5)
nRBC: 0 % (ref 0.0–0.2)

## 2021-10-26 MED ORDER — ONDANSETRON HCL 4 MG/2ML IJ SOLN
4.0000 mg | Freq: Once | INTRAMUSCULAR | Status: AC
  Administered 2021-10-26: 4 mg via INTRAVENOUS
  Filled 2021-10-26: qty 2

## 2021-10-26 MED ORDER — MORPHINE SULFATE (PF) 4 MG/ML IV SOLN
4.0000 mg | Freq: Once | INTRAVENOUS | Status: AC
  Administered 2021-10-26: 4 mg via INTRAVENOUS
  Filled 2021-10-26: qty 1

## 2021-10-26 MED ORDER — IOHEXOL 300 MG/ML  SOLN
100.0000 mL | Freq: Once | INTRAMUSCULAR | Status: AC | PRN
  Administered 2021-10-26: 100 mL via INTRAVENOUS

## 2021-10-26 MED ORDER — SODIUM CHLORIDE 0.9 % IV BOLUS
1000.0000 mL | Freq: Once | INTRAVENOUS | Status: AC
  Administered 2021-10-26: 1000 mL via INTRAVENOUS

## 2021-10-26 NOTE — ED Triage Notes (Signed)
Pt presents to ER with abdominal pain, report had some nausea. Denies any vomiting. Pt reports pain to lower abdomen. Pt reports back in June he had abdominal surgery where he believes he had about one foot long of his colon removed. Pt talks in complete sentences no respiratory distress noted

## 2021-10-26 NOTE — ED Provider Notes (Signed)
[facility] Provider Note    Event Date/Time   First MD Initiated Contact with Patient 10/26/21 2158     (approximate)   History   Abdominal Pain (//)   HPI  [name] [name] is a 61 y.o. male who complains of 3 days of low back pain in the low abdominal pain.  Patient reports he thinks may be he is having a recurrence of the diverticulitis he had when he had part of his bowel removed in June.  Pains been getting worse and initially was not in the front of his abdomen but now it is.  He is not having any fever or vomiting.  He is not having any dysuria.      Physical Exam   Triage Vital Signs: ED Triage Vitals  Enc Vitals Group     BP 10/26/21 2117 (!) 140/86     Pulse Rate 10/26/21 2117 96     Resp 10/26/21 2117 16     Temp 10/26/21 2117 98.1 F (36.7 C)     Temp Source 10/26/21 2117 Oral     SpO2 10/26/21 2117 96 %     Weight 10/26/21 2118 (!) 331 lb (150.1 kg)     Height 10/26/21 2118 6\' 1"  (1.854 m)     Head Circumference --      Peak Flow --      Pain Score 10/26/21 2117 8     Pain Loc --      Pain Edu? --      Excl. in GC? --     Most recent vital signs: Vitals:   10/27/21 0000 10/27/21 0100  BP: (!) 147/86 122/75  Pulse: 76 74  Resp:  18  Temp:    SpO2: 97% 98%     General: Awake, alert looks uncomfortable CV:  Good peripheral perfusion.  Heart regular rate and rhythm no audible murmurs Resp:  Normal effort.  Lungs are clear Abd:  No distention.  Soft there is some tenderness in the lower abdomen anteriorly especially midline and some pain very low down in the back around S1.  There is a mild amount of tenderness on palpation of the area as well. Extremities: No straight leg raise pain Rectal: No hemorrhoids are seen there is no stool in the vault prostate is boggy and tender  ED Results / Procedures / Treatments   Labs (all labs ordered are listed, but only abnormal results are displayed) Labs Reviewed  COMPREHENSIVE  METABOLIC PANEL - Abnormal; Notable for the following components:      Result Value   Glucose, Bld 139 (*)    Total Protein 8.4 (*)    All other components within normal limits  URINALYSIS, ROUTINE W REFLEX MICROSCOPIC - Abnormal; Notable for the following components:   Color, Urine YELLOW (*)    APPearance CLEAR (*)    Specific Gravity, Urine >1.046 (*)    Ketones, ur 5 (*)    All other components within normal limits  CBC  CBC WITH DIFFERENTIAL/PLATELET  DIFFERENTIAL     EKG    RADIOLOGY }   PROCEDURES:  Critical Care performed:   Procedures   MEDICATIONS ORDERED IN ED: Medications  morphine (PF) 4 MG/ML injection 4 mg (4 mg Intravenous Given 10/26/21 2223)  ondansetron (ZOFRAN) injection 4 mg (4 mg Intravenous Given 10/26/21 2222)  sodium chloride 0.9 % bolus 1,000 mL (0 mLs Intravenous Stopped 10/27/21 0106)  iohexol (OMNIPAQUE) 300 MG/ML solution 100 mL (100 mLs Intravenous  Contrast Given 10/26/21 2233)  ciprofloxacin (CIPRO) tablet 500 mg (500 mg Oral Given 10/27/21 0112)  oxyCODONE (Oxy IR/ROXICODONE) immediate release tablet 5 mg (5 mg Oral Given 10/27/21 0112)     IMPRESSION / MDM / ASSESSMENT AND PLAN / ED COURSE  I reviewed the triage vital signs and the nursing notes.  CT showed no acute problems that would cause the patient's pain.  Patient's urine finally did return and was negative although possibly showing some lack of fluid white count was negative his CMP also did not show any acute problems however on exam his prostate was tender and boggy and he did have low back pain and suprapubic pain which is consistent with possible early acute prostatitis.  He is not running a fever.  I will treat him for prostatitis and have him return if he is worse follow-up with his doctor very soon just to make sure nothing else turns up. Differential diagnosis includes, but is not limited to, prostatitis or UTI or low back pain or possibilities sigmoid diverticulitis would have  also been a possibility if it had been excluded by CT  Patient's presentation is most consistent with acute complicated illness / injury requiring diagnostic workup.  The patient is on the cardiac monitor to evaluate for evidence of arrhythmia and/or significant heart rate changes.  None have been seen      FINAL CLINICAL IMPRESSION(S) / ED DIAGNOSES   Final diagnoses:  Lower abdominal pain  Acute prostatitis     Rx / DC Orders   ED Discharge Orders          Ordered    ciprofloxacin (CIPRO) 500 MG tablet  2 times daily        10/27/21 0102    oxycodone (OXY-IR) 5 MG capsule  Every 6 hours PRN        10/27/21 0108             Note:  This document was prepared using Dragon voice recognition software and may include unintentional dictation errors.   Arnaldo Natal, MD 10/27/21 [phone]

## 2021-10-27 DIAGNOSIS — N410 Acute prostatitis: Secondary | ICD-10-CM | POA: Diagnosis not present

## 2021-10-27 LAB — URINALYSIS, ROUTINE W REFLEX MICROSCOPIC
Bilirubin Urine: NEGATIVE
Glucose, UA: NEGATIVE mg/dL
Hgb urine dipstick: NEGATIVE
Ketones, ur: 5 mg/dL — AB
Leukocytes,Ua: NEGATIVE
Nitrite: NEGATIVE
Protein, ur: NEGATIVE mg/dL
Specific Gravity, Urine: >1.046 — ABNORMAL HIGH (ref 1.005–1.030)
pH: 6 (ref 5.0–8.0)

## 2021-10-27 MED ORDER — OXYCODONE HCL 5 MG PO TABS
5.0000 mg | ORAL_TABLET | Freq: Once | ORAL | Status: AC
  Administered 2021-10-27: 5 mg via ORAL
  Filled 2021-10-27: qty 1

## 2021-10-27 MED ORDER — OXYCODONE HCL 5 MG PO CAPS: 5.0000 mg | ORAL_CAPSULE | Freq: Four times a day (QID) | ORAL | 0 refills | Status: DC | PRN

## 2021-10-27 MED ORDER — CIPROFLOXACIN HCL 500 MG PO TABS
500.0000 mg | ORAL_TABLET | Freq: Once | ORAL | Status: AC
  Administered 2021-10-27: 500 mg via ORAL
  Filled 2021-10-27: qty 1

## 2021-10-27 MED ORDER — CIPROFLOXACIN HCL 500 MG PO TABS: 500.0000 mg | ORAL_TABLET | Freq: Two times a day (BID) | ORAL | 0 refills | Status: DC

## 2021-10-27 NOTE — Discharge Instructions (Addendum)
It looks like you have prostatitis or an infection of the prostate gland.  Please take the Cipro 1 pill twice a day.  Please return for increasing pain or fever or vomiting.  Please remember what I told you about the Cipro occasionally causing tendon rupture.  If you get pain in the arms or legs stop the Cipro and come in or see your doctor.  Remember this is very unusual.  Please follow-up with your doctor later this week.  I have given you some oxycodone to use 1 pill every 6 hours if need be.  Be careful can make you woozy and constipated.  Do not drive on it.

## 2021-10-28 ENCOUNTER — Telehealth: Payer: Self-pay

## 2021-10-28 NOTE — Telephone Encounter (Unsigned)
Copied from CRM [id number]. Topic: Appointment Scheduling - Scheduling Inquiry for Clinic >> Oct 28, 2021  3:27 PM Pincus Sanes wrote: Reason for CRM: sooner appt, Pt was in the hospital with back pain over weekend and it has went into his groin, even as has pain meds his prostate is hurting him and wife states that the hospital says not his prostate, Pt was not happy with hospital and wife states he really trust Robynn Pane and was wanting to try to get a sooner appt with her than 9/22. Other providers offered but pt states will only see Robynn Pane. Please fu with wife at [phone]

## 2021-10-29 ENCOUNTER — Encounter: Payer: Self-pay | Admitting: Family Medicine

## 2021-10-29 ENCOUNTER — Ambulatory Visit (INDEPENDENT_AMBULATORY_CARE_PROVIDER_SITE_OTHER): Payer: Medicare Other | Admitting: Family Medicine

## 2021-10-29 VITALS — BP 160/70 | HR 91 | Temp 98.7°F | Resp 16 | Wt 322.0 lb

## 2021-10-29 DIAGNOSIS — N410 Acute prostatitis: Secondary | ICD-10-CM | POA: Diagnosis not present

## 2021-10-29 NOTE — Progress Notes (Signed)
   SUBJECTIVE:   CHIEF COMPLAINT / HPI:   ER FOLLOW UP Hospital/facility: [facility] Diagnosis: prostatitis vs UTI Procedures/tests:  - CT A/P - no acute findings - UA not c/w infection Consultants: none New medications:  - cipro - oxycodone Discharge instructions:   Status: same - lower abd pain and L sided back pain ~1 week ago that progressively worsened. Not alleviated by tizanidine, aspercreme.  - s/p sigmoid colectomy 07/2021 for diverticulitis. - feels similar to previous diverticulitis. - having some constipation since surgery. Taking miralax occasionally. - some tingling with urination. No increased frequency. - Anal pain when sitting.   OBJECTIVE:   BP (!) 160/70 (BP Location: Left Arm, Patient Position: Sitting, Cuff Size: Large)   Pulse 91   Temp 98.7 F (37.1 C) (Oral)   Resp 16   Wt (!) 322 lb (146.1 kg)   BMI 42.48 kg/m   Gen: tired appearing, in NAD Card: RRR Lungs: CTAB Abd: obese abdomen. Surgical sites well healed. TTP in lower quadrants, L>R and suprapubically. No organomegaly. No rebound tenderness or guarding Rectal exam: no fissure noted.  Sphincter tone normal. Tenderness noted to slighty enlarged and boggy prostate.  Ext: WWP, no edema   ASSESSMENT/PLAN:   Acute prostatitis Boggy, tender and enlarged prostate with slight pain with urination. Possibly pain made worse by pressure from constipation. No evidence on CT A/P to concern for surgical complication, bowel obstruction, diverticulitis. Recommend continuing antibiotic as prescribed, titrate miralax for effect. F/u next week if no better. Emergency precautions discussed.       Caro Laroche, DO

## 2021-10-29 NOTE — Telephone Encounter (Signed)
Appt scheduled with Dr. Linwood Dibbles for 10/29/2021 at 3pm.

## 2021-10-29 NOTE — Assessment & Plan Note (Signed)
Boggy, tender and enlarged prostate with slight pain with urination. Possibly pain made worse by pressure from constipation. No evidence on CT A/P to concern for surgical complication, bowel obstruction, diverticulitis. Recommend continuing antibiotic as prescribed, titrate miralax for effect. F/u next week if no better. Emergency precautions discussed.

## 2021-10-31 ENCOUNTER — Encounter: Payer: Self-pay | Admitting: Family Medicine

## 2021-10-31 ENCOUNTER — Emergency Department
Admission: EM | Admit: 2021-10-31 | Discharge: 2021-10-31 | Disposition: A | Discharge status: Home / Self Care | Payer: Medicare Other | Attending: Emergency Medicine | Admitting: Emergency Medicine

## 2021-10-31 ENCOUNTER — Encounter: Payer: Self-pay | Admitting: *Deleted

## 2021-10-31 ENCOUNTER — Other Ambulatory Visit: Payer: Self-pay

## 2021-10-31 ENCOUNTER — Ambulatory Visit: Payer: Self-pay

## 2021-10-31 ENCOUNTER — Emergency Department: Payer: Medicare Other

## 2021-10-31 DIAGNOSIS — E039 Hypothyroidism, unspecified: Secondary | ICD-10-CM | POA: Insufficient documentation

## 2021-10-31 DIAGNOSIS — H53149 Visual discomfort, unspecified: Secondary | ICD-10-CM | POA: Diagnosis not present

## 2021-10-31 DIAGNOSIS — R519 Headache, unspecified: Secondary | ICD-10-CM | POA: Insufficient documentation

## 2021-10-31 DIAGNOSIS — M791 Myalgia, unspecified site: Secondary | ICD-10-CM | POA: Insufficient documentation

## 2021-10-31 LAB — URINALYSIS, ROUTINE W REFLEX MICROSCOPIC
Bacteria, UA: NONE SEEN
Bilirubin Urine: NEGATIVE
Glucose, UA: NEGATIVE mg/dL
Ketones, ur: NEGATIVE mg/dL
Leukocytes,Ua: NEGATIVE
Nitrite: NEGATIVE
Protein, ur: NEGATIVE mg/dL
Specific Gravity, Urine: 1.015 (ref 1.005–1.030)
Squamous Epithelial / HPF: NONE SEEN (ref 0–5)
pH: 5 (ref 5.0–8.0)

## 2021-10-31 LAB — COMPREHENSIVE METABOLIC PANEL
ALT: 22 U/L (ref 0–44)
AST: 20 U/L (ref 15–41)
Albumin: 3.7 g/dL (ref 3.5–5.0)
Alkaline Phosphatase: 69 U/L (ref 38–126)
Anion gap: 10 (ref 5–15)
BUN: 17 mg/dL (ref 6–20)
CO2: 26 mmol/L (ref 22–32)
Calcium: 9 mg/dL (ref 8.9–10.3)
Chloride: 104 mmol/L (ref 98–111)
Creatinine, Ser: 0.71 mg/dL (ref 0.61–1.24)
GFR, Estimated: >60 mL/min (ref >60)
Glucose, Bld: 93 mg/dL (ref 70–99)
Potassium: 3.9 mmol/L (ref 3.5–5.1)
Sodium: 140 mmol/L (ref 135–145)
Total Bilirubin: 0.8 mg/dL (ref 0.3–1.2)
Total Protein: 7.9 g/dL (ref 6.5–8.1)

## 2021-10-31 LAB — CBC WITH DIFFERENTIAL/PLATELET
Abs Immature Granulocytes: 0.04 10*3/uL (ref 0.00–0.07)
Basophils Absolute: 0 10*3/uL (ref 0.0–0.1)
Basophils Relative: 1 %
Eosinophils Absolute: 0.2 10*3/uL (ref 0.0–0.5)
Eosinophils Relative: 3 %
HCT: 41.6 % (ref 39.0–52.0)
Hemoglobin: 13.5 g/dL (ref 13.0–17.0)
Immature Granulocytes: 1 %
Lymphocytes Relative: 21 %
Lymphs Abs: 1.2 10*3/uL (ref 0.7–4.0)
MCH: 28.5 pg (ref 26.0–34.0)
MCHC: 32.5 g/dL (ref 30.0–36.0)
MCV: 87.8 fL (ref 80.0–100.0)
Monocytes Absolute: 0.8 10*3/uL (ref 0.1–1.0)
Monocytes Relative: 13 %
Neutro Abs: 3.7 10*3/uL (ref 1.7–7.7)
Neutrophils Relative %: 61 %
Platelets: 152 10*3/uL (ref 150–400)
RBC: 4.74 MIL/uL (ref 4.22–5.81)
RDW: 12.6 % (ref 11.5–15.5)
WBC: 5.9 10*3/uL (ref 4.0–10.5)
nRBC: 0 % (ref 0.0–0.2)

## 2021-10-31 LAB — CK: Total CK: 39 U/L — ABNORMAL LOW (ref 49–397)

## 2021-10-31 MED ORDER — DIPHENHYDRAMINE HCL 50 MG/ML IJ SOLN: 12.5000 mg | Freq: Once | INTRAMUSCULAR | Status: DC

## 2021-10-31 MED ORDER — DIPHENHYDRAMINE HCL 50 MG/ML IJ SOLN
25.0000 mg | Freq: Once | INTRAMUSCULAR | Status: AC
  Administered 2021-10-31: 25 mg via INTRAVENOUS
  Filled 2021-10-31: qty 1

## 2021-10-31 MED ORDER — KETOROLAC TROMETHAMINE 15 MG/ML IJ SOLN
15.0000 mg | Freq: Once | INTRAMUSCULAR | Status: AC
  Administered 2021-10-31: 15 mg via INTRAVENOUS
  Filled 2021-10-31: qty 1

## 2021-10-31 MED ORDER — SULFAMETHOXAZOLE-TRIMETHOPRIM 800-160 MG PO TABS: 1.0000 | ORAL_TABLET | Freq: Two times a day (BID) | ORAL | 0 refills | Status: AC

## 2021-10-31 MED ORDER — FENTANYL CITRATE PF 50 MCG/ML IJ SOSY
50.0000 ug | PREFILLED_SYRINGE | Freq: Once | INTRAMUSCULAR | Status: AC
  Administered 2021-10-31: 50 ug via INTRAVENOUS
  Filled 2021-10-31: qty 1

## 2021-10-31 NOTE — ED Triage Notes (Signed)
Pt to triage via wheelchair.  Pt has a headache for 3 days  pt also has body aches in legs and hands.  Pt reports he was dx with prostatitis   pt alert  speech clear.

## 2021-10-31 NOTE — Discharge Instructions (Signed)
Your blood work, urinalysis, exam, and CT scan are reassuring.  Continue the medications prescribed by your primary care provider. You may also take Benadryl 25mg  every 6-8 hours.  Please follow up with your primary care provider. Call tomorrow to request an appointment.

## 2021-10-31 NOTE — Telephone Encounter (Signed)
     Chief Complaint: Pt. States he is having reaction to Cipro - headache, joints are achy. "I don't think I can take anymore of it."  Symptoms: Above Frequency: Yesterday Pertinent Negatives: Patient denies  Disposition: [] ED /[] Urgent Care (no appt availability in office) / [] Appointment(In office/virtual)/ []  [facility]/ [] Home Care/ [] Refused Recommended Disposition /[] [facility]/ [x]  Follow-up with PCP Additional Notes: Please advise pt.  Answer Assessment - Initial Assessment Questions 1. NAME of MEDICINE: "What medicine(s) are you calling about?"     Cipro 2. QUESTION: "What is your question?" (e.g., double dose of medicine, side effect)     Side effects - leg pain, hands , joints achy 3. PRESCRIBER: "Who prescribed the medicine?" Reason: if prescribed by specialist, call should be referred to that group.     Dr. 4. SYMPTOMS: "Do you have any symptoms?" If Yes, ask: "What symptoms are you having?"  "How bad are the symptoms (e.g., mild, moderate, severe)     Yes 5. PREGNANCY:  "Is there any chance that you are pregnant?" "When was your last menstrual period?"     N/a  Protocols used: Medication Question Call-A-AH

## 2021-10-31 NOTE — ED Notes (Signed)
Pt verbalized understanding of discharge instructions and follow-up care instructions. E-signature not available due to e-signature pad not working.  

## 2021-10-31 NOTE — ED Provider Notes (Signed)
[facility] Emergency Department Provider Note ____________________________________________  Time seen: Approximately 3:59 PM  I have reviewed the triage vital signs and the nursing notes.   HISTORY  Chief Complaint Headache   HPI [name] [name] is a 61 y.o. male with history of diverticulitis, GERD, sleep apnea, hep C, diastolic dysfunction and as listed in the EMR presents to the emergency department for treatment and evaluation of diffuse muscle and joint pain, headache with photophobia, and persistent need to move arms and legs.  The symptoms started after he was treated with ciprofloxacin for presumed prostatitis.  Last dose of ciprofloxacin was this morning but has since stopped taking it as he feels that it may be associated with his current symptoms.  He has not had a headache like this in the past.  He states that it started in the back of his head and moved to the top.  No associated nausea and no aura.  No injury family has also noted some swelling in the lower extremities.  Past Medical History:  Diagnosis Date   Acid reflux    Anxiety    Aortic root dilatation (HCC) 12/30/2019   a.) TTE 12/30/2019: measured 39 mm.   Arthritis    Carpal tunnel syndrome, bilateral    Depression    Diastolic dysfunction 12/30/2019   a.) TTE 12/30/2019: norm LV size; mod LVH; EF 55-60% with diastolic dysfunction (details Re: grade not available); norm RV size and function; mod LAE; triv MR/TR; norm AoV.   Diverticulitis    Dyspnea    Fibromyalgia    GERD (gastroesophageal reflux disease)    Glaucoma    Hepatitis C    High cholesterol    History of kidney stones    Hypothyroidism    Juvenile epilepsy (HCC)    OSA on CPAP    Positive TB test    ___________________________________________   PHYSICAL EXAM:  VITAL SIGNS: ED Triage Vitals  Enc Vitals Group     BP 10/31/21 1526 120/70     Pulse Rate 10/31/21 1526 95     Resp 10/31/21 1526 20     Temp 10/31/21  1526 98.3 F (36.8 C)     Temp Source 10/31/21 1526 Oral     SpO2 10/31/21 1526 96 %     Weight 10/31/21 1523 (!) 322 lb (146.1 kg)     Height 10/31/21 1523 6\' 1"  (1.854 m)     Head Circumference --      Peak Flow --      Pain Score 10/31/21 1523 8     Pain Loc --      Pain Edu? --      Excl. in GC? --     Constitutional: Alert and oriented. Well appearing and in no acute distress. Eyes: Conjunctivae are normal. PERRL. EOMI without expressed pain. No evidence of papilledema on limited exam. Head: Atraumatic. Nose: No congestion/rhinnorhea. Mouth/Throat: Mucous membranes are moist.  Oropharynx non-erythematous. Neck: No stridor. Supple, no meningismus.  Cardiovascular: Normal rate, regular rhythm. Grossly normal heart sounds.  Good peripheral circulation.  No pitting edema of the lower extremities Respiratory: Normal respiratory effort.  No retractions. Lungs CTAB. Gastrointestinal: Soft and nontender. No distention.  Musculoskeletal: Diffuse extremity musculoskeletal pain without rash, lesion, wound or focal symptoms Neurologic:  Normal speech and language. No gross focal neurologic deficits are appreciated. No gait instability. Cranial nerves: 2-10 normal as tested. Cerebellar:Normal Romberg, finger-nose-finger, heel to shin, normal gait. Sensorimotor: No aphasia, pronator drift, clonus, sensory  loss or abnormal reflexes.  Skin:  Skin is warm, dry and intact. No rash noted. Psychiatric: Mood and affect are normal. Speech and behavior are normal. Normal thought process and cognition.  ____________________________________________   LABS (all labs ordered are listed, but only abnormal results are displayed)  Labs Reviewed  URINALYSIS, ROUTINE W REFLEX MICROSCOPIC - Abnormal; Notable for the following components:      Result Value   Color, Urine YELLOW (*)    APPearance CLEAR (*)    Hgb urine dipstick SMALL (*)    All other components within normal limits  CK - Abnormal; Notable  for the following components:   Total CK 39 (*)    All other components within normal limits  COMPREHENSIVE METABOLIC PANEL  CBC WITH DIFFERENTIAL/PLATELET   ____________________________________________  EKG  Not indicated. ____________________________________________  RADIOLOGY  CT Head Wo Contrast  Result Date: 10/31/2021 CLINICAL DATA:  Headache.  Possible as reaction to ciprofloxacin. EXAM: CT HEAD WITHOUT CONTRAST TECHNIQUE: Contiguous axial images were obtained from the base of the skull through the vertex without intravenous contrast. RADIATION DOSE REDUCTION: This exam was performed according to the departmental dose-optimization program which includes automated exposure control, adjustment of the mA and/or kV according to patient size and/or use of iterative reconstruction technique. COMPARISON:  None Available. FINDINGS: Brain: No evidence of acute infarction, hemorrhage, hydrocephalus, extra-axial collection or mass lesion/mass effect. Vascular: No hyperdense vessel or unexpected calcification. Skull: Normal. Negative for fracture or focal lesion. Sinuses/Orbits: No acute finding. Other: None. IMPRESSION: Normal CT examination of the head. Electronically Signed   By: Larose Hires D.O.   On: 10/31/2021 16:28   ____________________________________________   PROCEDURES  Procedure(s) performed:  Procedures  Critical Care performed: No ____________________________________________   INITIAL IMPRESSION / ASSESSMENT AND PLAN / ED COURSE  61 year old male presenting to the emergency department for treatment and evaluation of multiple medical complaints as listed in the HPI.  Plan will be to get a CT of his head and lab studies plus a urinalysis.  Lab and urinalysis studies are overall very reassuring, vital signs have remained stable, CT of his head is without acute concerns.  Exam is also reassuring and there are no acute neurological findings.  There is no rash, open wound, or  lesion that would indicate urticaria or allergic drug reaction.  He has had no sensation of lip or tongue swelling or difficulty breathing.  Results were discussed with the patient and significant other.  Significant other is concerned that this may be a significant reaction to ciprofloxacin and would like some sort of definitive answer for treatment and diagnosis.  Patient evaluated by ED attending.  CK ordered to labs already drawn just to ensure that there is no evidence of rhabdomyolysis.  Otherwise, patient will be safe for discharge home and follow-up with primary care.  Significant other reported to her that the primary care provider has already submitted a replacement antibiotic for the ciprofloxacin.  Benadryl and Toradol ordered.  CK is normal.  Headache has significantly improved and patient to be discharged as discussed above.  ER return precautions given.  Pertinent labs & imaging results that were available during my care of the patient were reviewed by me and considered in my medical decision making (see chart for details). ____________________________________________   FINAL CLINICAL IMPRESSION(S) / ED DIAGNOSES  Final diagnoses:  Myalgia  Acute nonintractable headache, unspecified headache type    ED Discharge Orders     None  Chinita Pester, FNP 10/31/21 2359    Concha Se, MD 11/02/21 [phone]

## 2021-10-31 NOTE — Telephone Encounter (Signed)
LMTCB-Ok for [facility] nurse to advise/give providers message

## 2021-10-31 NOTE — Telephone Encounter (Signed)
Updates and ED visit noted.

## 2021-10-31 NOTE — Telephone Encounter (Signed)
Replied through my chart as well.  FYI-patient at the ED

## 2021-10-31 NOTE — Telephone Encounter (Signed)
Please ask patient to stop Ciprofloxacin and start Bactrim for 7 days.   Ronnald Ramp, MD  [facility]  [phone]

## 2021-10-31 NOTE — ED Notes (Signed)
See triage note  Presents with possible allergic rxn to cipro  States he was started on Cipro on Saturday   Was seen by PCP on Tuesday Was told to cont the medication  Then developed bilateral leg pain last pm and now also having h/a and body aches   Afebrile on arrival

## 2021-10-31 NOTE — Telephone Encounter (Signed)
Pt's wife called to say she was taking him to the ER>

## 2021-11-05 ENCOUNTER — Encounter: Payer: Medicaid Other | Admitting: Surgery

## 2021-11-06 NOTE — Progress Notes (Signed)
Established patient visit   Patient: [name] [name]   DOB: 108-21-1962   61 y.o. Male  MRN: [id number] Visit Date: 11/08/2021  Today's healthcare provider: [name], FNP  Re Introduced to nurse practitioner role and practice setting.  All questions answered.  Discussed provider/patient relationship and expectations.   I,Tiffany J Bragg,acting as a scribe for [name], FNP.,have documented all relevant documentation on the behalf of [name], FNP,as directed by  [name], FNP while in the presence of [name], FNP.   Chief Complaint  Patient presents with   Pain    Patient states he had a reaction to a medication a week ago and complains of continued joint pain and lower abdominal pain, difficulty urinating.    Subjective    HPI HPI     Pain    Additional comments: Patient states he had a reaction to a medication a week ago and complains of continued joint pain and lower abdominal pain, difficulty urinating.       Last edited by Smitty Knudsen, CMA on 11/08/2021  1:27 PM.       Medications: Outpatient Medications Prior to Visit  Medication Sig   levothyroxine (SYNTHROID) 50 MCG tablet TAKE 1 TABLET(50 MCG) BY MOUTH DAILY   NON FORMULARY cpap device   omeprazole (PRILOSEC) 20 MG capsule Take 20 mg by mouth daily.   oxycodone (OXY-IR) 5 MG capsule Take 1 capsule (5 mg total) by mouth every 6 (six) hours as needed.   rosuvastatin (CRESTOR) 10 MG tablet Take 1 tablet (10 mg total) by mouth daily.   [DISCONTINUED] buPROPion ER (WELLBUTRIN SR) 100 MG 12 hr tablet Take 1 tablet (100 mg total) by mouth 2 (two) times daily.   [DISCONTINUED] busPIRone (BUSPAR) 7.5 MG tablet Take 7.5 mg by mouth as needed.   pregabalin (LYRICA) 100 MG capsule Take 1 capsule (100 mg total) by mouth 3 (three) times daily for 14 days.   [DISCONTINUED] ciprofloxacin (CIPRO) 500 MG tablet Take 1 tablet (500 mg total) by mouth 2 (two) times daily for 14 days.   [DISCONTINUED]  phentermine 37.5 MG capsule Take 1 capsule (37.5 mg total) by mouth every morning. (Patient not taking: Reported on 11/08/2021)   No facility-administered medications prior to visit.    Review of Systems    Objective    BP 124/82 (BP Location: Right Arm, Patient Position: Sitting, Cuff Size: Large)   Pulse (!) 102   Resp 16   Ht 6\' 1"  (1.854 m)   Wt (!) 320 lb (145.2 kg)   SpO2 98%   BMI 42.22 kg/m    Physical Exam Vitals and nursing note reviewed.  Constitutional:      Appearance: Normal appearance. He is obese.  HENT:     Head: Normocephalic and atraumatic.  Eyes:     Pupils: Pupils are equal, round, and reactive to light.  Cardiovascular:     Rate and Rhythm: Normal rate and regular rhythm.     Pulses: Normal pulses.     Heart sounds: Normal heart sounds.  Pulmonary:     Effort: Pulmonary effort is normal.     Breath sounds: Normal breath sounds.  Abdominal:     General: Bowel sounds are normal. There is no distension.     Palpations: Abdomen is soft.     Tenderness: There is no abdominal tenderness. There is no right CVA tenderness, left CVA tenderness or guarding.  Musculoskeletal:  General: Normal range of motion.     Cervical back: Normal range of motion.  Skin:    General: Skin is warm and dry.     Capillary Refill: Capillary refill takes less than 2 seconds.  Neurological:     General: No focal deficit present.     Mental Status: He is alert and oriented to person, place, and time. Mental status is at baseline.  Psychiatric:        Mood and Affect: Mood normal.        Behavior: Behavior normal.        Thought Content: Thought content normal.        Judgment: Judgment normal.       No results found for any visits on 11/08/21.  Assessment & Plan     Problem List Items Addressed This Visit       Genitourinary   Acute prostatitis    Acute on chronic; recommend repeat labs and screening for PSA Referral to urology Declines concern for edema  or STI screen       Relevant Orders   Ambulatory referral to Urology     Other   Generalized abdominal pain    1 month hx of abdominal pain; CT negative No focal complaints Denies GI concerns or difficulty eating Request for additional pain medication to assist      Relevant Medications   traMADol (ULTRAM) 50 MG tablet   Other Relevant Orders   PSA   CBC   Alkaline phosphatase, isoenzymes   Comprehensive metabolic panel   Mixed hyperlipidemia    Chronic, previously elevated Repeat LP I recommend diet low in saturated fat and regular exercise - 30 min at least 5 times per week Currently on crestor 10 mg       Relevant Orders   Lipid panel   Morbid obesity (HCC) - Primary    Chronic, stable Body mass index is 42.22 kg/m. Discussed importance of healthy weight management Discussed diet and exercise Failed phentermine and wellbutrin d/t side effects Continue to recommend balanced, lower carb meals. Smaller meal size, adding snacks. Choosing water as drink of choice and increasing purposeful exercise.       Screening for prostate cancer    Defer DRE; previously treated with Cipro which caused malaise and patient is still recovering from that Denies LUTS; however, endorses lower abdominal pain       Relevant Orders   PSA     Return if symptoms worsen or fail to improve.      Leilani Merl, FNP, have reviewed all documentation for this visit. The documentation on 11/08/21 for the exam, diagnosis, procedures, and orders are all accurate and complete.    Jacky Kindle, FNP  [facility] [phone] (phone) [phone] (fax)  [facility]

## 2021-11-07 ENCOUNTER — Telehealth: Payer: Self-pay | Admitting: Family Medicine

## 2021-11-07 NOTE — Telephone Encounter (Signed)
Copied from [place] [id number]. Topic: Medicare AWV >> Nov 07, 2021 11:16 AM Jae Dire wrote: Reason for CRM:  No answer unable to leave amessage for patient to call back and schedule Medicare Annual Wellness Visit (AWV) in office.   If unable to come into the office for AWV,  please offer to do virtually or by telephone.  Last AWV:  01/19/2020  Please schedule at anytime with [facility]visor.  30 minute appointment for Virtual or phone 45 minute appointment for in office or Initial virtual/phone  Any questions, please contact me at [phone]

## 2021-11-08 ENCOUNTER — Ambulatory Visit (INDEPENDENT_AMBULATORY_CARE_PROVIDER_SITE_OTHER): Payer: Medicare Other | Admitting: Family Medicine

## 2021-11-08 ENCOUNTER — Other Ambulatory Visit: Payer: Self-pay | Admitting: Family Medicine

## 2021-11-08 ENCOUNTER — Telehealth: Payer: Self-pay | Admitting: Family Medicine

## 2021-11-08 ENCOUNTER — Telehealth: Payer: Self-pay | Admitting: *Deleted

## 2021-11-08 ENCOUNTER — Encounter: Payer: Self-pay | Admitting: Family Medicine

## 2021-11-08 DIAGNOSIS — N410 Acute prostatitis: Secondary | ICD-10-CM

## 2021-11-08 DIAGNOSIS — E782 Mixed hyperlipidemia: Secondary | ICD-10-CM | POA: Diagnosis not present

## 2021-11-08 DIAGNOSIS — Z125 Encounter for screening for malignant neoplasm of prostate: Secondary | ICD-10-CM | POA: Insufficient documentation

## 2021-11-08 DIAGNOSIS — R1084 Generalized abdominal pain: Secondary | ICD-10-CM | POA: Insufficient documentation

## 2021-11-08 MED ORDER — TRAMADOL HCL 50 MG PO TABS: 50.0000 mg | ORAL_TABLET | Freq: Four times a day (QID) | ORAL | 0 refills | Status: AC

## 2021-11-08 MED ORDER — BUPROPION HCL ER (SR) 100 MG PO TB12: 100.0000 mg | ORAL_TABLET | Freq: Two times a day (BID) | ORAL | 0 refills | Status: DC

## 2021-11-08 MED ORDER — BUSPIRONE HCL 7.5 MG PO TABS: 7.5000 mg | ORAL_TABLET | Freq: Three times a day (TID) | ORAL | 1 refills | Status: DC

## 2021-11-08 MED ORDER — OXYCODONE HCL 5 MG PO TABS: 5.0000 mg | ORAL_TABLET | ORAL | 0 refills | Status: DC | PRN

## 2021-11-08 NOTE — Assessment & Plan Note (Signed)
Acute on chronic; recommend repeat labs and screening for PSA Referral to urology Declines concern for edema or STI screen

## 2021-11-08 NOTE — Assessment & Plan Note (Signed)
Chronic, previously elevated Repeat LP I recommend diet low in saturated fat and regular exercise - 30 min at least 5 times per week Currently on crestor 10 mg

## 2021-11-08 NOTE — Patient Instructions (Signed)
You will be contacted from urology.

## 2021-11-08 NOTE — Assessment & Plan Note (Signed)
Chronic, stable Body mass index is 42.22 kg/m. Discussed importance of healthy weight management Discussed diet and exercise Failed phentermine and wellbutrin d/t side effects Continue to recommend balanced, lower carb meals. Smaller meal size, adding snacks. Choosing water as drink of choice and increasing purposeful exercise.

## 2021-11-08 NOTE — Telephone Encounter (Signed)
Pts spouse states pt uses [place] home patient for cpap supplies  Spouse checking status of return call regarding  oxycodone (OXY-IR) 5 MG   Please fu w/ pt

## 2021-11-08 NOTE — Telephone Encounter (Signed)
Received orders for heavy duty cane but [facility] does not carry heavy duty equipment.

## 2021-11-08 NOTE — Assessment & Plan Note (Signed)
1 month hx of abdominal pain; CT negative No focal complaints Denies GI concerns or difficulty eating Request for additional pain medication to assist

## 2021-11-08 NOTE — Assessment & Plan Note (Signed)
Defer DRE; previously treated with Cipro which caused malaise and patient is still recovering from that Denies LUTS; however, endorses lower abdominal pain

## 2021-11-08 NOTE — Telephone Encounter (Signed)
Copied from [place] [id number]. Topic: General - Other >> Nov 08, 2021  2:20 PM Ludger Nutting wrote: Patients wife called and said the wrong pain medication was sent for refill. Patient needs oxycodone (OXY-IR) 5 MG capsule. Please advise.

## 2021-11-10 NOTE — Progress Notes (Signed)
Cholesterol is improved; LDL is now at goal. I recommend diet low in saturated fat and regular exercise - 30 min at least 5 times per week  All other labs that have returned are normal and stable.  [name], [address] [place], [place] [address] [phone] (phone) [phone] (fax) [place]

## 2021-11-11 NOTE — Telephone Encounter (Signed)
Called patient to see what exact [employer] supplier is used, as there are several.

## 2021-11-12 NOTE — Telephone Encounter (Signed)
Reached pt, states he does not have that information, his wife does. She works nights and is sleeping. Asked to CB to speak to wife at 1pm.

## 2021-11-12 NOTE — Telephone Encounter (Signed)
Called again to ask about specific home health supplier. LMTCB. Okay for PEC to advise.

## 2021-11-13 ENCOUNTER — Ambulatory Visit: Payer: Medicare Other | Admitting: Family Medicine

## 2021-11-14 LAB — COMPREHENSIVE METABOLIC PANEL
ALT: 32 IU/L (ref 0–44)
AST: 24 IU/L (ref 0–40)
Albumin/Globulin Ratio: 1.1 — ABNORMAL LOW (ref 1.2–2.2)
Albumin: 4.3 g/dL (ref 3.8–4.9)
Alkaline Phosphatase: 91 IU/L (ref 44–121)
BUN/Creatinine Ratio: 23 — ABNORMAL HIGH (ref 9–20)
BUN: 22 mg/dL (ref 6–24)
Bilirubin Total: 0.4 mg/dL (ref 0.0–1.2)
CO2: 23 mmol/L (ref 20–29)
Calcium: 9.4 mg/dL (ref 8.7–10.2)
Chloride: 101 mmol/L (ref 96–106)
Creatinine, Ser: 0.96 mg/dL (ref 0.76–1.27)
Globulin, Total: 3.9 g/dL (ref 1.5–4.5)
Glucose: 98 mg/dL (ref 70–99)
Potassium: 4.5 mmol/L (ref 3.5–5.2)
Sodium: 140 mmol/L (ref 134–144)
Total Protein: 8.2 g/dL (ref 6.0–8.5)
eGFR: 92 mL/min/{1.73_m2} (ref >59)

## 2021-11-14 LAB — CBC
Hematocrit: 40.6 % (ref 37.5–51.0)
Hemoglobin: 13.7 g/dL (ref 13.0–17.7)
MCH: 29.6 pg (ref 26.6–33.0)
MCHC: 33.7 g/dL (ref 31.5–35.7)
MCV: 88 fL (ref 79–97)
Platelets: 238 10*3/uL (ref 150–450)
RBC: 4.63 x10E6/uL (ref 4.14–5.80)
RDW: 12 % (ref 11.6–15.4)
WBC: 8.2 10*3/uL (ref 3.4–10.8)

## 2021-11-14 LAB — ALKALINE PHOSPHATASE, ISOENZYMES
BONE FRACTION: 22 % (ref 12–68)
INTESTINAL FRAC.: 2 % (ref 0–18)
LIVER FRACTION: 76 % (ref 13–88)

## 2021-11-14 LAB — PSA: Prostate Specific Ag, Serum: 0.5 ng/mL (ref 0.0–4.0)

## 2021-11-14 LAB — LIPID PANEL
Chol/HDL Ratio: 4 ratio (ref 0.0–5.0)
Cholesterol, Total: 124 mg/dL (ref 100–199)
HDL: 31 mg/dL — ABNORMAL LOW (ref >39)
LDL Chol Calc (NIH): 65 mg/dL (ref 0–99)
Triglycerides: 164 mg/dL — ABNORMAL HIGH (ref 0–149)
VLDL Cholesterol Cal: 28 mg/dL (ref 5–40)

## 2021-11-17 ENCOUNTER — Other Ambulatory Visit: Payer: Self-pay | Admitting: Family Medicine

## 2021-11-17 DIAGNOSIS — E039 Hypothyroidism, unspecified: Secondary | ICD-10-CM

## 2021-11-19 ENCOUNTER — Ambulatory Visit: Payer: Medicare Other | Admitting: Urology

## 2021-11-22 NOTE — Telephone Encounter (Signed)
Patient's wife, [name] [name] called and stated that they are still waiting on a heavy duty cane. Please advise on statues of request.

## 2021-11-26 NOTE — Progress Notes (Unsigned)
12/03/2021 8:37 PM   [name] [name] [birth year] [id number]  Referring provider: Jacky Kindle, FNP [address] [place],  [place] [address]  No chief complaint on file.   HPI: 61 year old male who presents today for further follow-up of an emergency room visit for presumed "acute prostatitis".  He presented on 10/26/2021 with some low back pain as well as suprapubic pain.  His urinalysis was negative.  He had no leukocytosis.  CT scan was also negative including no stranding around the prostate and no bladder findings..  Prostate exam was "tender and boggy" and as such she was diagnosed clinically with prostatitis by Dr. Marge Duncans.  He was given Cipro.  He returned back to the emergency room on 9/19 after he taken his last dose of Cipro with diffuse myalgia, joint pain headache and photophobia.  He followed up with his PCP on 11/08/2021.  That point, he was having some urinary complaints, had a PSA checked which was within normal limits and referred to urology for further evaluation.  PMH: Past Medical History:  Diagnosis Date   Acid reflux    Anxiety    Aortic root dilatation (HCC) 12/30/2019   a.) TTE 12/30/2019: measured 39 mm.   Arthritis    Carpal tunnel syndrome, bilateral    Depression    Diastolic dysfunction 12/30/2019   a.) TTE 12/30/2019: norm LV size; mod LVH; EF 55-60% with diastolic dysfunction (details Re: grade not available); norm RV size and function; mod LAE; triv MR/TR; norm AoV.   Diverticulitis    Dyspnea    Fibromyalgia    GERD (gastroesophageal reflux disease)    Glaucoma    Hepatitis C    High cholesterol    History of kidney stones    Hypothyroidism    Juvenile epilepsy (HCC)    OSA on CPAP    Positive TB test     Surgical History: Past Surgical History:  Procedure Laterality Date   COLONOSCOPY WITH PROPOFOL N/A 02/01/2021   Procedure: COLONOSCOPY WITH PROPOFOL;  Surgeon: Regis Bill, MD;  Location: [facility] ENDOSCOPY;  Service:  Endoscopy;  Laterality: N/A;   ESOPHAGOGASTRODUODENOSCOPY (EGD) WITH PROPOFOL N/A 02/01/2021   Procedure: ESOPHAGOGASTRODUODENOSCOPY (EGD) WITH PROPOFOL;  Surgeon: Regis Bill, MD;  Location: [facility] ENDOSCOPY;  Service: Endoscopy;  Laterality: N/A;   LIVER BIOPSY      Home Medications:  Allergies as of 12/03/2021       Reactions   Tylenol [acetaminophen] Other (See Comments)   Was told not to take d/t having hep c treatment   Celecoxib Anxiety   Zoloft [sertraline Hcl] Anxiety        Medication List        Accurate as of November 26, 2021  8:37 PM. If you have any questions, ask your nurse or doctor.          busPIRone 7.5 MG tablet Commonly known as: BUSPAR Take 1 tablet (7.5 mg total) by mouth 3 (three) times daily.   levothyroxine 50 MCG tablet Commonly known as: SYNTHROID TAKE 1 TABLET(50 MCG) BY MOUTH DAILY   NON FORMULARY cpap device   omeprazole 20 MG capsule Commonly known as: PRILOSEC Take 20 mg by mouth daily.   oxyCODONE 5 MG immediate release tablet Commonly known as: Oxy IR/ROXICODONE Take 1 tablet (5 mg total) by mouth every 4 (four) hours as needed for severe pain.   pregabalin 100 MG capsule Commonly known as: LYRICA Take 1 capsule (100 mg total) by mouth 3 (three) times  daily for 14 days.   rosuvastatin 10 MG tablet Commonly known as: Crestor Take 1 tablet (10 mg total) by mouth daily.        Allergies:  Allergies  Allergen Reactions   Tylenol [Acetaminophen] Other (See Comments)    Was told not to take d/t having hep c treatment   Celecoxib Anxiety   Zoloft [Sertraline Hcl] Anxiety    Family History: Family History  Problem Relation Age of Onset   Aneurysm Mother    Cancer Father    Lung cancer Father    Mental illness Sister    Mental illness Sister    Brain cancer Maternal Grandmother    Emphysema Maternal Grandfather    Drug abuse Maternal Uncle    Drug abuse Paternal Aunt     Social History:  reports that he  has never smoked. He has been exposed to tobacco smoke. He has never used smokeless tobacco. He reports that he does not currently use alcohol. He reports current drug use. Frequency: 2.50 times per week. Drug: Marijuana.   Physical Exam: There were no vitals taken for this visit.  Constitutional:  Alert and oriented, No acute distress. HEENT: [place] AT, moist mucus membranes.  Trachea midline, no masses. Cardiovascular: No clubbing, cyanosis, or edema. Respiratory: Normal respiratory effort, no increased work of breathing. GI: Abdomen is soft, nontender, nondistended, no abdominal masses GU: No CVA tenderness Skin: No rashes, bruises or suspicious lesions. Neurologic: Grossly intact, no focal deficits, moving all 4 extremities. Psychiatric: Normal mood and affect.  Laboratory Data: Lab Results  Component Value Date   WBC 8.2 11/08/2021   HGB 13.7 11/08/2021   HCT 40.6 11/08/2021   MCV 88 11/08/2021   PLT 238 11/08/2021    Lab Results  Component Value Date   CREATININE 0.96 11/08/2021    No results found for: "PSA"  No results found for: "TESTOSTERONE"  Lab Results  Component Value Date   HGBA1C 5.5 06/12/2021    Urinalysis    Component Value Date/Time   COLORURINE YELLOW (A) 10/31/2021 1635   APPEARANCEUR CLEAR (A) 10/31/2021 1635   APPEARANCEUR Clear 01/19/2020 0320   LABSPEC 1.015 10/31/2021 1635   PHURINE 5.0 10/31/2021 1635   GLUCOSEU NEGATIVE 10/31/2021 1635   HGBUR SMALL (A) 10/31/2021 1635   BILIRUBINUR NEGATIVE 10/31/2021 1635   BILIRUBINUR Negative 01/19/2020 0320   KETONESUR NEGATIVE 10/31/2021 1635   PROTEINUR NEGATIVE 10/31/2021 1635   UROBILINOGEN 0.2 01/03/2020 1412   NITRITE NEGATIVE 10/31/2021 1635   LEUKOCYTESUR NEGATIVE 10/31/2021 1635    Lab Results  Component Value Date   LABMICR Comment 01/19/2020   WBCUA None seen 01/19/2020   LABEPIT None seen 01/19/2020   MUCUS Present 12/08/2018   BACTERIA NONE SEEN 10/31/2021    Pertinent  Imaging: *** Results for orders placed during the hospital encounter of 04/04/20  DG Abd 1 View  Narrative CLINICAL DATA:  Pain  EXAM: ABDOMEN - 1 VIEW  COMPARISON:  January 11, 2020  FINDINGS: Air and stool-filled nondilated loops of bowel. Moderate colonic stool burden diffusely throughout the colon. Visualized lung bases are unremarkable. Degenerative changes of the lumbar spine.  IMPRESSION: Nonobstructive bowel gas pattern. Moderate colonic stool burden diffusely throughout the colon.   Electronically Signed By: Valentino Saxon MD On: 04/06/2020 16:26  No results found for this or any previous visit.  No results found for this or any previous visit.  No results found for this or any previous visit.  No results found for  this or any previous visit.  No valid procedures specified. No results found for this or any previous visit.  No results found for this or any previous visit.   Assessment & Plan:    There are no diagnoses linked to this encounter.  No follow-ups on file.  Vanna Scotland, MD  [facility] [address], [address] [place], [place] [address] [phone]

## 2021-11-29 ENCOUNTER — Ambulatory Visit: Payer: Self-pay

## 2021-12-03 ENCOUNTER — Encounter: Payer: Self-pay | Admitting: Urology

## 2021-12-03 ENCOUNTER — Ambulatory Visit (INDEPENDENT_AMBULATORY_CARE_PROVIDER_SITE_OTHER): Payer: Medicare Other | Admitting: Urology

## 2021-12-03 VITALS — BP 145/84 | HR 72 | Ht 73.0 in | Wt 333.0 lb

## 2021-12-03 DIAGNOSIS — N411 Chronic prostatitis: Secondary | ICD-10-CM | POA: Diagnosis not present

## 2021-12-03 DIAGNOSIS — R3129 Other microscopic hematuria: Secondary | ICD-10-CM | POA: Diagnosis not present

## 2021-12-03 DIAGNOSIS — N410 Acute prostatitis: Secondary | ICD-10-CM

## 2021-12-03 LAB — URINALYSIS, COMPLETE
Bilirubin, UA: NEGATIVE
Glucose, UA: NEGATIVE
Ketones, UA: NEGATIVE
Leukocytes,UA: NEGATIVE
Nitrite, UA: NEGATIVE
Protein,UA: NEGATIVE
Specific Gravity, UA: 1.015 (ref 1.005–1.030)
Urobilinogen, Ur: 0.2 mg/dL (ref 0.2–1.0)
pH, UA: 7 (ref 5.0–7.5)

## 2021-12-03 LAB — MICROSCOPIC EXAMINATION

## 2021-12-03 LAB — BLADDER SCAN AMB NON-IMAGING

## 2021-12-03 MED ORDER — MELOXICAM 7.5 MG PO TABS: 7.5000 mg | ORAL_TABLET | Freq: Every day | ORAL | 0 refills | Status: DC

## 2021-12-03 MED ORDER — TAMSULOSIN HCL 0.4 MG PO CAPS: 0.4000 mg | ORAL_CAPSULE | Freq: Every day | ORAL | 3 refills | Status: DC

## 2021-12-05 ENCOUNTER — Telehealth: Payer: Self-pay | Admitting: Family Medicine

## 2021-12-05 NOTE — Telephone Encounter (Signed)
Copied from [place] [id number] [phone]. Topic: Medicare AWV >> Dec 05, 2021 11:24 AM [name] [name] wrote: Reason for CRM:  No answer unable to leave a message for patient to call back and schedule Medicare Annual Wellness Visit (AWV) in office.   If unable to come into the office for AWV,  please offer to do virtually or by telephone.  Last AWV: 01/19/2020  Please schedule at anytime with [facility] Health Advisor.  30 minute appointment for Virtual or phone 45 minute appointment for in office or Initial virtual/phone  Any questions, please contact me at [phone]

## 2021-12-10 DIAGNOSIS — G4733 Obstructive sleep apnea (adult) (pediatric): Secondary | ICD-10-CM | POA: Diagnosis not present

## 2021-12-17 ENCOUNTER — Other Ambulatory Visit: Payer: Self-pay | Admitting: Family Medicine

## 2021-12-17 DIAGNOSIS — R1084 Generalized abdominal pain: Secondary | ICD-10-CM

## 2021-12-20 ENCOUNTER — Ambulatory Visit: Payer: Self-pay | Admitting: *Deleted

## 2021-12-20 NOTE — Telephone Encounter (Addendum)
  Chief Complaint: severe abdominal pain since this am upon awakening. Requesting pain medication refill. Symptoms: abdominal pain severe every am . Nausea, difficulty tolerating drinking water. Reports pain located around "belt line".feels like not emptying with urination.  Frequency: since this am 0800. Pain noted every am since surgery greater than month ago  Pertinent Negatives: Patient denies fever. No vomiting  Disposition: [x] ED /[] Urgent Care (no appt availability in office) / [] Appointment(In office/virtual)/ []  [facility]/ [] Home Care/ [] Refused Recommended Disposition /[] [facility]/ []  Follow-up with PCP Additional Notes:   Recommeneded go to ED due to severe pain. Patient reports pain usually eases throughout the day. Ran out of pain medication oxycodone. Requesting refill. Will see surgeon later this month.     Reason for Disposition  Patient sounds very sick or weak to the triager  Answer Assessment - Initial Assessment Questions 1. LOCATION: "Where does it hurt?"      Abdominal pain in stomach  at belt area 2. RADIATION: "Does the pain shoot anywhere else?" (e.g., chest, back)     *No Answer* 3. ONSET: "When did the pain begin?" (Minutes, hours or days ago)      *No Answer* 4. SUDDEN: "Gradual or sudden onset?"     *No Answer* 5. PATTERN "Does the pain come and go, or is it constant?"    - If it comes and goes: "How long does it last?" "Do you have pain now?"     (Note: Comes and goes means the pain is intermittent. It goes away completely between bouts.)    - If constant: "Is it getting better, staying the same, or getting worse?"      (Note: Constant means the pain never goes away completely; most serious pain is constant and gets worse.)      *No Answer* 6. SEVERITY: "How bad is the pain?"  (e.g., Scale 1-10; mild, moderate, or severe)    - MILD (1-3): Doesn't interfere with normal activities, abdomen soft and not tender to touch.     -  MODERATE (4-7): Interferes with normal activities or awakens from sleep, abdomen tender to touch.     - SEVERE (8-10): Excruciating pain, doubled over, unable to do any normal activities.       Severe. Wakes up from a sleep  7. RECURRENT SYMPTOM: "Have you ever had this type of stomach pain before?" If Yes, ask: "When was the last time?" and "What happened that time?"      Yes happens every am  8. CAUSE: "What do you think is causing the stomach pain?"     Not sure .  9. RELIEVING/AGGRAVATING FACTORS: "What makes it better or worse?" (e.g., antacids, bending or twisting motion, bowel movement)     *No Answer* 10. OTHER SYMPTOMS: "Do you have any other symptoms?" (e.g., back pain, diarrhea, fever, urination pain, vomiting)       Abdominal pain , nausea every am severe pain  Protocols used: Abdominal Pain - Male-A-AH

## 2021-12-20 NOTE — Telephone Encounter (Signed)
Protocol for abdominal pain : Abdominal pain located belt area. Began this am 0800 but reports ongoing since surgery greater than month. Pain comes and goes starts when waking up causes nausea no vomiting. Usually goes away throughout day. Severe pain now. Unable to tolerate drinking water. Has happened before every am . Wakes up with abdominal pain at times. Not sure of cause. Denies diarrhea, no fever. Reports feels like he does not empty bladder. Recommended standing to urinate. Patient reports he is out of pain medication and requesting refill. Recommended due to sx go to ED. Unsure of disposition. Please advise .

## 2021-12-24 NOTE — Telephone Encounter (Signed)
Spoke with patient. He has an appt with surgeon at the end of the month. Advised ED if symptoms worsen before then.

## 2021-12-30 NOTE — Progress Notes (Deleted)
      Established patient visit   Patient: [name] [name]   DOB: 28-Nov-1960   61 y.o. Male  MRN: [id number] Visit Date: 01/01/2022  Today's healthcare provider: Jacky Kindle, FNP   No chief complaint on file.  Subjective    HPI  ***  Medications: Outpatient Medications Prior to Visit  Medication Sig   busPIRone (BUSPAR) 7.5 MG tablet Take 1 tablet (7.5 mg total) by mouth 3 (three) times daily.   levothyroxine (SYNTHROID) 50 MCG tablet TAKE 1 TABLET(50 MCG) BY MOUTH DAILY   meloxicam (MOBIC) 7.5 MG tablet Take 1 tablet (7.5 mg total) by mouth daily.   NON FORMULARY cpap device   omeprazole (PRILOSEC) 20 MG capsule Take 20 mg by mouth daily.   oxyCODONE (OXY IR/ROXICODONE) 5 MG immediate release tablet Take 1 tablet (5 mg total) by mouth every 4 (four) hours as needed for severe pain.   pregabalin (LYRICA) 100 MG capsule Take 1 capsule (100 mg total) by mouth 3 (three) times daily for 14 days.   rosuvastatin (CRESTOR) 10 MG tablet Take 1 tablet (10 mg total) by mouth daily.   tamsulosin (FLOMAX) 0.4 MG CAPS capsule Take 1 capsule (0.4 mg total) by mouth daily.   No facility-administered medications prior to visit.    Review of Systems  {Labs  Heme  Chem  Endocrine  Serology  Results Review (optional):23779}   Objective    There were no vitals taken for this visit. {Show previous vital signs (optional):23777}  Physical Exam  ***  No results found for any visits on 01/01/22.  Assessment & Plan     ***  No follow-ups on file.      {provider attestation***:1}   Jacky Kindle, FNP  [facility] [phone] (phone) [phone] (fax)  [facility]

## 2021-12-31 ENCOUNTER — Ambulatory Visit: Payer: Medicaid Other | Admitting: Physician Assistant

## 2022-01-01 ENCOUNTER — Ambulatory Visit: Payer: Medicaid Other | Admitting: Surgery

## 2022-01-01 ENCOUNTER — Ambulatory Visit: Payer: Medicare Other | Admitting: Family Medicine

## 2022-01-01 ENCOUNTER — Telehealth: Payer: Self-pay | Admitting: Family Medicine

## 2022-01-01 NOTE — Telephone Encounter (Signed)
Left message for patient to call back and schedule Medicare Annual Wellness Visit (AWV) in office.  ° °If not able to come in office, please offer to do virtually or by telephone.  Left office number and my jabber #[phone]. ° °Last AWV:01/19/2020 ° °Please schedule at anytime with Nurse Health Advisor. °  °

## 2022-01-13 NOTE — Progress Notes (Deleted)
01/14/2022 1:30 PM   [name] [name] [birth year] [id number]  Referring provider: Jacky Kindle, FNP [address] [place],  [place] [address]  Urological history: 1.     No chief complaint on file.   HPI: [name] [name] is a 61 y.o. male who presents today for follow up.    He was seen by Dr. Apolinar Junes on 12/03/2021 after being seen in the ED and treated for "acute prostatitis."  He did not have leukocytosis or fevers and had a normal UA and normal CT.  He was counseled on symptoms for acute prostatitis and explained that he did not have acute prostatitis.  He was given 6 weeks of tamsulosin and two weeks of meloxicam.   There was also a finding of incidental micro heme.       Score:  1-7 Mild 8-19 Moderate 20-35 Severe    PMH: Past Medical History:  Diagnosis Date   Acid reflux    Anxiety    Aortic root dilatation (HCC) 12/30/2019   a.) TTE 12/30/2019: measured 39 mm.   Arthritis    Carpal tunnel syndrome, bilateral    Depression    Diastolic dysfunction 12/30/2019   a.) TTE 12/30/2019: norm LV size; mod LVH; EF 55-60% with diastolic dysfunction (details Re: grade not available); norm RV size and function; mod LAE; triv MR/TR; norm AoV.   Diverticulitis    Dyspnea    Fibromyalgia    GERD (gastroesophageal reflux disease)    Glaucoma    Hepatitis C    High cholesterol    History of kidney stones    Hypothyroidism    Juvenile epilepsy (HCC)    OSA on CPAP    Positive TB test     Surgical History: Past Surgical History:  Procedure Laterality Date   COLONOSCOPY WITH PROPOFOL N/A 02/01/2021   Procedure: COLONOSCOPY WITH PROPOFOL;  Surgeon: Regis Bill, MD;  Location: [facility] ENDOSCOPY;  Service: Endoscopy;  Laterality: N/A;   ESOPHAGOGASTRODUODENOSCOPY (EGD) WITH PROPOFOL N/A 02/01/2021   Procedure: ESOPHAGOGASTRODUODENOSCOPY (EGD) WITH PROPOFOL;  Surgeon: Regis Bill, MD;  Location: [facility] ENDOSCOPY;  Service: Endoscopy;  Laterality: N/A;   LIVER  BIOPSY      Home Medications:  Allergies as of 01/14/2022       Reactions   Tylenol [acetaminophen] Other (See Comments)   Was told not to take d/t having hep c treatment   Celecoxib Anxiety   Zoloft [sertraline Hcl] Anxiety        Medication List        Accurate as of January 13, 2022  1:30 PM. If you have any questions, ask your nurse or doctor.          busPIRone 7.5 MG tablet Commonly known as: BUSPAR Take 1 tablet (7.5 mg total) by mouth 3 (three) times daily.   levothyroxine 50 MCG tablet Commonly known as: SYNTHROID TAKE 1 TABLET(50 MCG) BY MOUTH DAILY   meloxicam 7.5 MG tablet Commonly known as: Mobic Take 1 tablet (7.5 mg total) by mouth daily.   NON FORMULARY cpap device   omeprazole 20 MG capsule Commonly known as: PRILOSEC Take 20 mg by mouth daily.   oxyCODONE 5 MG immediate release tablet Commonly known as: Oxy IR/ROXICODONE Take 1 tablet (5 mg total) by mouth every 4 (four) hours as needed for severe pain.   pregabalin 100 MG capsule Commonly known as: LYRICA Take 1 capsule (100 mg total) by mouth 3 (three) times daily for 14 days.   rosuvastatin  10 MG tablet Commonly known as: Crestor Take 1 tablet (10 mg total) by mouth daily.   tamsulosin 0.4 MG Caps capsule Commonly known as: FLOMAX Take 1 capsule (0.4 mg total) by mouth daily.        Allergies:  Allergies  Allergen Reactions   Tylenol [Acetaminophen] Other (See Comments)    Was told not to take d/t having hep c treatment   Celecoxib Anxiety   Zoloft [Sertraline Hcl] Anxiety    Family History: Family History  Problem Relation Age of Onset   Aneurysm Mother    Cancer Father    Lung cancer Father    Mental illness Sister    Mental illness Sister    Brain cancer Maternal Grandmother    Emphysema Maternal Grandfather    Drug abuse Maternal Uncle    Drug abuse Paternal Aunt     Social History:  reports that he has never smoked. He has been exposed to tobacco smoke.  He has never used smokeless tobacco. He reports that he does not currently use alcohol. He reports current drug use. Frequency: 2.50 times per week. Drug: Marijuana.  ROS: Pertinent ROS in HPI  Physical Exam: There were no vitals taken for this visit.  Constitutional:  Well nourished. Alert and oriented, No acute distress. HEENT: [place] AT, moist mucus membranes.  Trachea midline, no masses. Cardiovascular: No clubbing, cyanosis, or edema. Respiratory: Normal respiratory effort, no increased work of breathing. GI: Abdomen is soft, non tender, non distended, no abdominal masses. Liver and spleen not palpable.  No hernias appreciated.  Stool sample for occult testing is not indicated.   GU: No CVA tenderness.  No bladder fullness or masses.  Patient with circumcised/uncircumcised phallus. ***Foreskin easily retracted***  Urethral meatus is patent.  No penile discharge. No penile lesions or rashes. Scrotum without lesions, cysts, rashes and/or edema.  Testicles are located scrotally bilaterally. No masses are appreciated in the testicles. Left and right epididymis are normal. Rectal: Patient with  normal sphincter tone. Anus and perineum without scarring or rashes. No rectal masses are appreciated. Prostate is approximately *** grams, *** nodules are appreciated. Seminal vesicles are normal. Skin: No rashes, bruises or suspicious lesions. Lymph: No cervical or inguinal adenopathy. Neurologic: Grossly intact, no focal deficits, moving all 4 extremities. Psychiatric: Normal mood and affect.  Laboratory Data: Urinalysis ***   I have reviewed the labs.   Pertinent Imaging: N/A  Assessment & Plan:    1. BPH with LUTS -PSA stable  -DRE benign *** -UA benign *** -PVR < 300 cc  -symptoms - *** -most bothersome symptoms are *** -continue conservative management, avoiding bladder irritants and timed voiding's -Initiate alpha-blocker (***), discussed side effects *** -Initiate 5 alpha  reductase inhibitor (***), discussed side effects *** -Continue tamsulosin 0.4 mg daily, alfuzosin 10 mg daily, Rapaflo 8 mg daily, terazosin, doxazosin, Cialis 5 mg daily and finasteride 5 mg daily, dutasteride 0.5 mg daily***:refills given -Cannot tolerate medication or medication failure, schedule cystoscopy ***       No follow-ups on file.  These notes generated with voice recognition software. I apologize for typographical errors.  Cloretta Ned  [facility] [address]  [address] [place], [place] [address] [phone]

## 2022-01-14 ENCOUNTER — Ambulatory Visit: Payer: Medicare Other | Admitting: Urology

## 2022-01-14 ENCOUNTER — Encounter: Payer: Self-pay | Admitting: Urology

## 2022-01-14 DIAGNOSIS — N401 Enlarged prostate with lower urinary tract symptoms: Secondary | ICD-10-CM

## 2022-01-16 ENCOUNTER — Ambulatory Visit: Payer: Medicare Other | Admitting: Internal Medicine

## 2022-01-16 NOTE — Progress Notes (Deleted)
Follow-up Outpatient Visit Date: 01/16/2022  Primary Care Provider: Jacky Kindle, FNP [address] [place] [address]  Chief Complaint: ***  HPI:  [name] is a 61 y.o. male with history of hyperlipidemia, hepatitis C, hypothyroidism, obstructive sleep apnea, seizures as a child/young adult (last seizure in 19 or 1988) diverticulitis, depression, anxiety, GERD, and arthritis, who presents for follow-up of diastolic dysfunction.  --------------------------------------------------------------------------------------------------  Cardiovascular History & Procedures: Cardiovascular Problems: Diastolic dysfunction   Risk Factors: Hyperlipidemia, well gender, morbid obesity, and age greater than 41   Cath/PCI: None   CV Surgery: None   EP Procedures and Devices: None   Non-Invasive Evaluation(s): TTE (01/02/2020, [facility]): Normal LV size with moderate LVH.  LVEF 55-60% with diastolic dysfunction (details not available).  Normal RV size and function.  Moderate left atrial enlargement.  Trace mitral and tricuspid regurgitation.  Normal aortic valve.  Borderline dilated aortic root (3.9 cm).  Recent CV Pertinent Labs: Lab Results  Component Value Date   CHOL 124 11/08/2021   HDL 31 (L) 11/08/2021   LDLCALC 65 11/08/2021   TRIG 164 (H) 11/08/2021   CHOLHDL 4.0 11/08/2021   INR 1.0 06/12/2021   K 4.5 11/08/2021   MG 2.2 07/24/2021   BUN 22 11/08/2021   CREATININE 0.96 11/08/2021    Past medical and surgical history were reviewed and updated in EPIC.  No outpatient medications have been marked as taking for the 01/16/22 encounter (Appointment) with Shuree Brossart, Cristal Deer, MD.    Allergies: Tylenol [acetaminophen], Celecoxib, and Zoloft [sertraline hcl]  Social History   Tobacco Use   Smoking status: Never    Passive exposure: Current   Smokeless tobacco: Never  Vaping Use   Vaping Use: Never used  Substance Use Topics   Alcohol use: Not Currently    Drug use: Yes    Frequency: 2.5 times per week    Types: Marijuana    Comment: occasional    Family History  Problem Relation Age of Onset   Aneurysm Mother    Cancer Father    Lung cancer Father    Mental illness Sister    Mental illness Sister    Brain cancer Maternal Grandmother    Emphysema Maternal Grandfather    Drug abuse Maternal Uncle    Drug abuse Paternal Aunt     Review of Systems: A 12-system review of systems was performed and was negative except as noted in the HPI.  --------------------------------------------------------------------------------------------------  Physical Exam: There were no vitals taken for this visit.  General:  NAD. Neck: No JVD or HJR. Lungs: Clear to auscultation bilaterally without wheezes or crackles. Heart: Regular rate and rhythm without murmurs, rubs, or gallops. Abdomen: Soft, nontender, nondistended. Extremities: No lower extremity edema.  EKG:  ***  Lab Results  Component Value Date   WBC 8.2 11/08/2021   HGB 13.7 11/08/2021   HCT 40.6 11/08/2021   MCV 88 11/08/2021   PLT 238 11/08/2021    Lab Results  Component Value Date   NA 140 11/08/2021   K 4.5 11/08/2021   CL 101 11/08/2021   CO2 23 11/08/2021   BUN 22 11/08/2021   CREATININE 0.96 11/08/2021   GLUCOSE 98 11/08/2021   ALT 32 11/08/2021    Lab Results  Component Value Date   CHOL 124 11/08/2021   HDL 31 (L) 11/08/2021   LDLCALC 65 11/08/2021   TRIG 164 (H) 11/08/2021   CHOLHDL 4.0 11/08/2021    --------------------------------------------------------------------------------------------------  ASSESSMENT AND PLAN: ***  Yvonne Kendall, MD 01/16/2022 10:02 AM

## 2022-01-28 ENCOUNTER — Ambulatory Visit (INDEPENDENT_AMBULATORY_CARE_PROVIDER_SITE_OTHER): Payer: Medicare Other

## 2022-01-28 VITALS — BP 124/80 | Ht 73.0 in | Wt 347.6 lb

## 2022-01-28 DIAGNOSIS — Z0000 Encounter for general adult medical examination without abnormal findings: Secondary | ICD-10-CM

## 2022-01-28 NOTE — Patient Instructions (Signed)
[name] [name] , Thank you for taking time to come for your Medicare Wellness Visit. I appreciate your ongoing commitment to your health goals. Please review the following plan we discussed and let me know if I can assist you in the future.   Screening recommendations/referrals: Colonoscopy: 02/01/21 Recommended yearly ophthalmology/optometry visit for glaucoma screening and checkup Recommended yearly dental visit for hygiene and checkup  Vaccinations: Influenza vaccine: n/d Pneumococcal vaccine: n/d Tdap vaccine: n/d Shingles vaccine: n/d   Covid-19: n/d  Advanced directives: no  Conditions/risks identified: none  Next appointment: Follow up in one year for your annual wellness visit. 02/02/23 @ 2:15 pm in person  Preventive Care 65 Years and Older, Male Preventive care refers to lifestyle choices and visits with your health care provider that can promote health and wellness. What does preventive care include? A yearly physical exam. This is also called an annual well check. Dental exams once or twice a year. Routine eye exams. Ask your health care provider how often you should have your eyes checked. Personal lifestyle choices, including: Daily care of your teeth and gums. Regular physical activity. Eating a healthy diet. Avoiding tobacco and drug use. Limiting alcohol use. Practicing safe sex. Taking low doses of aspirin every day. Taking vitamin and mineral supplements as recommended by your health care provider. What happens during an annual well check? The services and screenings done by your health care provider during your annual well check will depend on your age, overall health, lifestyle risk factors, and family history of disease. Counseling  Your health care provider may ask you questions about your: Alcohol use. Tobacco use. Drug use. Emotional well-being. Home and relationship well-being. Sexual activity. Eating habits. History of falls. Memory and ability to  understand (cognition). Work and work Astronomer. Screening  You may have the following tests or measurements: Height, weight, and BMI. Blood pressure. Lipid and cholesterol levels. These may be checked every 5 years, or more frequently if you are over 62 years old. Skin check. Lung cancer screening. You may have this screening every year starting at age 21 if you have a 30-pack-year history of smoking and currently smoke or have quit within the past 15 years. Fecal occult blood test (FOBT) of the stool. You may have this test every year starting at age 58. Flexible sigmoidoscopy or colonoscopy. You may have a sigmoidoscopy every 5 years or a colonoscopy every 10 years starting at age 71. Prostate cancer screening. Recommendations will vary depending on your family history and other risks. Hepatitis C blood test. Hepatitis B blood test. Sexually transmitted disease (STD) testing. Diabetes screening. This is done by checking your blood sugar (glucose) after you have not eaten for a while (fasting). You may have this done every 1-3 years. Abdominal aortic aneurysm (AAA) screening. You may need this if you are a current or former smoker. Osteoporosis. You may be screened starting at age 73 if you are at high risk. Talk with your health care provider about your test results, treatment options, and if necessary, the need for more tests. Vaccines  Your health care provider may recommend certain vaccines, such as: Influenza vaccine. This is recommended every year. Tetanus, diphtheria, and acellular pertussis (Tdap, Td) vaccine. You may need a Td booster every 10 years. Zoster vaccine. You may need this after age 58. Pneumococcal 13-valent conjugate (PCV13) vaccine. One dose is recommended after age 92. Pneumococcal polysaccharide (PPSV23) vaccine. One dose is recommended after age 9. Talk to your health care provider about  which screenings and vaccines you need and how often you need them. This  information is not intended to replace advice given to you by your health care provider. Make sure you discuss any questions you have with your health care provider. Document Released: 03/02/2015 Document Revised: 10/24/2015 Document Reviewed: 12/05/2014 Elsevier Interactive Patient Education  2017 [employer] Prevention in the Home Falls can cause injuries. They can happen to people of all ages. There are many things you can do to make your home safe and to help prevent falls. What can I do on the outside of my home? Regularly fix the edges of walkways and driveways and fix any cracks. Remove anything that might make you trip as you walk through a door, such as a raised step or threshold. Trim any bushes or trees on the path to your home. Use bright outdoor lighting. Clear any walking paths of anything that might make someone trip, such as rocks or tools. Regularly check to see if handrails are loose or broken. Make sure that both sides of any steps have handrails. Any raised decks and porches should have guardrails on the edges. Have any leaves, snow, or ice cleared regularly. Use sand or salt on walking paths during winter. Clean up any spills in your garage right away. This includes oil or grease spills. What can I do in the bathroom? Use night lights. Install grab bars by the toilet and in the tub and shower. Do not use towel bars as grab bars. Use non-skid mats or decals in the tub or shower. If you need to sit down in the shower, use a plastic, non-slip stool. Keep the floor dry. Clean up any water that spills on the floor as soon as it happens. Remove soap buildup in the tub or shower regularly. Attach bath mats securely with double-sided non-slip rug tape. Do not have throw rugs and other things on the floor that can make you trip. What can I do in the bedroom? Use night lights. Make sure that you have a light by your bed that is easy to reach. Do not use any sheets or  blankets that are too big for your bed. They should not hang down onto the floor. Have a firm chair that has side arms. You can use this for support while you get dressed. Do not have throw rugs and other things on the floor that can make you trip. What can I do in the kitchen? Clean up any spills right away. Avoid walking on wet floors. Keep items that you use a lot in easy-to-reach places. If you need to reach something above you, use a strong step stool that has a grab bar. Keep electrical cords out of the way. Do not use floor polish or wax that makes floors slippery. If you must use wax, use non-skid floor wax. Do not have throw rugs and other things on the floor that can make you trip. What can I do with my stairs? Do not leave any items on the stairs. Make sure that there are handrails on both sides of the stairs and use them. Fix handrails that are broken or loose. Make sure that handrails are as long as the stairways. Check any carpeting to make sure that it is firmly attached to the stairs. Fix any carpet that is loose or worn. Avoid having throw rugs at the top or bottom of the stairs. If you do have throw rugs, attach them to the floor with  carpet tape. Make sure that you have a light switch at the top of the stairs and the bottom of the stairs. If you do not have them, ask someone to add them for you. What else can I do to help prevent falls? Wear shoes that: Do not have high heels. Have rubber bottoms. Are comfortable and fit you well. Are closed at the toe. Do not wear sandals. If you use a stepladder: Make sure that it is fully opened. Do not climb a closed stepladder. Make sure that both sides of the stepladder are locked into place. Ask someone to hold it for you, if possible. Clearly mark and make sure that you can see: Any grab bars or handrails. First and last steps. Where the edge of each step is. Use tools that help you move around (mobility aids) if they are  needed. These include: Canes. Walkers. Scooters. Crutches. Turn on the lights when you go into a dark area. Replace any light bulbs as soon as they burn out. Set up your furniture so you have a clear path. Avoid moving your furniture around. If any of your floors are uneven, fix them. If there are any pets around you, be aware of where they are. Review your medicines with your doctor. Some medicines can make you feel dizzy. This can increase your chance of falling. Ask your doctor what other things that you can do to help prevent falls. This information is not intended to replace advice given to you by your health care provider. Make sure you discuss any questions you have with your health care provider. Document Released: 11/30/2008 Document Revised: 07/12/2015 Document Reviewed: 03/10/2014 Elsevier Interactive Patient Education  2017 [employer].

## 2022-01-28 NOTE — Progress Notes (Signed)
Subjective:   Mateo Overbeck is a 61 y.o. male who presents for Medicare Annual/Subsequent preventive examination.  Review of Systems     Cardiac Risk Factors include: advanced age (>58men, >37 women);obesity (BMI >30kg/m2);dyslipidemia;male gender     Objective:    Today's Vitals   01/28/22 1333 01/28/22 1334  BP: 124/80   Weight: (!) 347 lb 9.6 oz (157.7 kg)   Height: 6\' 1"  (1.854 m)   PainSc:  7    Body mass index is 45.86 kg/m.     01/28/2022    1:41 PM 10/31/2021    3:23 PM 10/26/2021    9:19 PM 07/23/2021    8:21 AM 07/17/2021    4:02 PM 07/09/2021   11:31 AM 02/01/2021    9:08 AM  Advanced Directives  Does Patient Have a Medical Advance Directive? No No No No No No No  Would patient like information on creating a medical advance directive? No - Patient declined   No - Patient declined  No - Patient declined     Current Medications (verified) Outpatient Encounter Medications as of 01/28/2022  Medication Sig   busPIRone (BUSPAR) 7.5 MG tablet Take 1 tablet (7.5 mg total) by mouth 3 (three) times daily.   levothyroxine (SYNTHROID) 50 MCG tablet TAKE 1 TABLET(50 MCG) BY MOUTH DAILY   NON FORMULARY cpap device   omeprazole (PRILOSEC) 20 MG capsule Take 20 mg by mouth daily.   rosuvastatin (CRESTOR) 10 MG tablet Take 1 tablet (10 mg total) by mouth daily.   meloxicam (MOBIC) 7.5 MG tablet Take 1 tablet (7.5 mg total) by mouth daily. (Patient not taking: Reported on 01/28/2022)   oxyCODONE (OXY IR/ROXICODONE) 5 MG immediate release tablet Take 1 tablet (5 mg total) by mouth every 4 (four) hours as needed for severe pain. (Patient not taking: Reported on 01/28/2022)   pregabalin (LYRICA) 100 MG capsule Take 1 capsule (100 mg total) by mouth 3 (three) times daily for 14 days.   tamsulosin (FLOMAX) 0.4 MG CAPS capsule Take 1 capsule (0.4 mg total) by mouth daily. (Patient not taking: Reported on 01/28/2022)   No facility-administered encounter medications on file as of  01/28/2022.    Allergies (verified) Tylenol [acetaminophen], Celecoxib, and Zoloft [sertraline hcl]   History: Past Medical History:  Diagnosis Date   Acid reflux    Anxiety    Aortic root dilatation (HCC) 12/30/2019   a.) TTE 12/30/2019: measured 39 mm.   Arthritis    Carpal tunnel syndrome, bilateral    Depression    Diastolic dysfunction 12/30/2019   a.) TTE 12/30/2019: norm LV size; mod LVH; EF 55-60% with diastolic dysfunction (details Re: grade not available); norm RV size and function; mod LAE; triv MR/TR; norm AoV.   Diverticulitis    Dyspnea    Fibromyalgia    GERD (gastroesophageal reflux disease)    Glaucoma    Hepatitis C    High cholesterol    History of kidney stones    Hypothyroidism    Juvenile epilepsy (HCC)    OSA on CPAP    Positive TB test    Past Surgical History:  Procedure Laterality Date   COLONOSCOPY WITH PROPOFOL N/A 02/01/2021   Procedure: COLONOSCOPY WITH PROPOFOL;  Surgeon: 02/03/2021, MD;  Location: [facility] ENDOSCOPY;  Service: Endoscopy;  Laterality: N/A;   ESOPHAGOGASTRODUODENOSCOPY (EGD) WITH PROPOFOL N/A 02/01/2021   Procedure: ESOPHAGOGASTRODUODENOSCOPY (EGD) WITH PROPOFOL;  Surgeon: 02/03/2021, MD;  Location: [facility] ENDOSCOPY;  Service: Endoscopy;  Laterality: N/A;  LIVER BIOPSY     Family History  Problem Relation Age of Onset   Aneurysm Mother    Cancer Father    Lung cancer Father    Mental illness Sister    Mental illness Sister    Brain cancer Maternal Grandmother    Emphysema Maternal Grandfather    Drug abuse Maternal Uncle    Drug abuse Paternal Aunt    Social History   Socioeconomic History   Marital status: Married    Spouse name: Engineer, water   Number of children: 1   Years of education: Not on file   Highest education level: Not on file  Occupational History   Not on file  Tobacco Use   Smoking status: Never    Passive exposure: Current   Smokeless tobacco: Never  Vaping Use   Vaping Use: Never  used  Substance and Sexual Activity   Alcohol use: Not Currently   Drug use: Yes    Frequency: 2.5 times per week    Types: Marijuana    Comment: occasional   Sexual activity: Not on file  Other Topics Concern   Not on file  Social History Narrative   Not on file   Social Determinants of Health   Financial Resource Strain: Low Risk  (01/28/2022)   Overall Financial Resource Strain (CARDIA)    Difficulty of Paying Living Expenses: Not hard at all  Food Insecurity: No Food Insecurity (01/28/2022)   Hunger Vital Sign    Worried About Running Out of Food in the Last Year: Never true    Ran Out of Food in the Last Year: Never true  Transportation Needs: No Transportation Needs (01/28/2022)   PRAPARE - Administrator, Civil Service (Medical): No    Lack of Transportation (Non-Medical): No  Physical Activity: Insufficiently Active (01/28/2022)   Exercise Vital Sign    Days of Exercise per Week: 2 days    Minutes of Exercise per Session: 20 min  Stress: Stress Concern Present (01/28/2022)   [employer] of Occupational Health - Occupational Stress Questionnaire    Feeling of Stress : To some extent  Social Connections: Moderately Integrated (01/28/2022)   Social Connection and Isolation Panel [NHANES]    Frequency of Communication with Friends and Family: Three times a week    Frequency of Social Gatherings with Friends and Family: Once a week    Attends Religious Services: More than 4 times per year    Active Member of [employer] or Organizations: No    Attends Engineer, structural: Never    Marital Status: Married    Tobacco Counseling Counseling given: Not Answered   Clinical Intake:  Pre-visit preparation completed: Yes  Pain : 0-10 Pain Score: 7  Pain Type: Chronic pain Pain Location: Abdomen     Nutritional Risks: None Diabetes: No  How often do you need to have someone help you when you read instructions, pamphlets, or other written  materials from your doctor or pharmacy?: 1 - Never  Diabetic?no  Interpreter Needed?: No  Information entered by :: Kennedy Bucker, LPN   Activities of Daily Living    01/28/2022    1:43 PM 11/08/2021    1:30 PM  In your present state of health, do you have any difficulty performing the following activities:  Hearing? 0 0  Vision? 0 0  Difficulty concentrating or making decisions? 0 0  Walking or climbing stairs? 0 1  Dressing or bathing? 0 0  Doing errands, shopping?  0 0  Preparing Food and eating ? N   Using the Toilet? N   In the past six months, have you accidently leaked urine? N   Do you have problems with loss of bowel control? N   Managing your Medications? N   Managing your Finances? N   Housekeeping or managing your Housekeeping? N     Patient Care Team: Jacky KindlePayne, Elise T, FNP as PCP - General (Family Medicine) End, Cristal Deerhristopher, MD as PCP - Cardiology (Cardiology) Monika SalkHazelwood, Angela, [facility] as Pharmacist (Pharmacist)  Indicate any recent Medical Services you may have received from other than [facility] providers in the past year (date may be approximate).     Assessment:   This is a routine wellness examination for [employer].  Hearing/Vision screen Hearing Screening - Comments:: No aids Vision Screening - Comments:: Wears glasses- in [place]  Dietary issues and exercise activities discussed: Current Exercise Habits: Home exercise routine, Type of exercise: walking, Time (Minutes): 20, Frequency (Times/Week): 2, Weekly Exercise (Minutes/Week): 40, Intensity: Mild   Goals Addressed             This Visit's Progress    DIET - EAT MORE FRUITS AND VEGETABLES         Depression Screen    01/28/2022    1:38 PM 11/08/2021    1:29 PM 10/29/2021    3:19 PM 10/09/2021    1:39 PM 08/14/2021    1:17 PM 06/12/2021    2:15 PM 09/24/2020    2:25 PM  PHQ 2/9 Scores  PHQ - 2 Score 3 6 6 5 2 2 1   PHQ- 9 Score  21 18 15 8 4      Fall Risk    01/28/2022    1:42 PM 11/08/2021     1:29 PM 10/29/2021    3:19 PM 10/09/2021    1:39 PM 08/14/2021    1:16 PM  Fall Risk   Falls in the past year? 1 0 0 1 1  Number falls in past yr: 0 0  1 0  Injury with Fall? 0 0 0 1 0  Risk for fall due to : History of fall(s) No Fall Risks No Fall Risks History of fall(s);Impaired balance/gait   Follow up Falls evaluation completed;Falls prevention discussed Falls evaluation completed Falls evaluation completed Falls evaluation completed     FALL RISK PREVENTION PERTAINING TO THE HOME:  Any stairs in or around the home? No  If so, are there any without handrails? No  Home free of loose throw rugs in walkways, pet beds, electrical cords, etc? Yes  Adequate lighting in your home to reduce risk of falls? Yes   ASSISTIVE DEVICES UTILIZED TO PREVENT FALLS:  Life alert? No  Use of a cane, walker or w/c? Yes  Grab bars in the bathroom? Yes  Shower chair or bench in shower? Yes  Elevated toilet seat or a handicapped toilet? No   TIMED UP AND GO:  Was the test performed? Yes .  Length of time to ambulate 10 feet: 4 sec.   Gait steady and fast without use of assistive device  Cognitive Function:    01/19/2020    3:33 PM 12/18/2018    4:54 PM  MMSE - Mini Mental State Exam  Orientation to time 5 5  Orientation to Place 5 5  Registration 3 3  Attention/ Calculation 5 5  Recall 3 3  Language- name 2 objects 2 2  Language- repeat 1 1  Language- follow 3 step  command 3 3  Language- read & follow direction 1 1  Write a sentence 1 1  Copy design 1 1  Total score 30 30        01/28/2022    1:47 PM  6CIT Screen  What Year? 0 points  What month? 0 points  What time? 0 points  Count back from 20 0 points  Months in reverse 0 points  Repeat phrase 2 points  Total Score 2 points    Immunizations Immunization History  Administered Date(s) Administered   Influenza Inj Mdck Quad Pf 12/06/2019    TDAP status: Due, Education has been provided regarding the importance of  this vaccine. Advised may receive this vaccine at local pharmacy or Health Dept. Aware to provide a copy of the vaccination record if obtained from local pharmacy or Health Dept. Verbalized acceptance and understanding.  Flu Vaccine status: Due, Education has been provided regarding the importance of this vaccine. Advised may receive this vaccine at local pharmacy or Health Dept. Aware to provide a copy of the vaccination record if obtained from local pharmacy or Health Dept. Verbalized acceptance and understanding.  Pneumococcal vaccine status: Declined,  Education has been provided regarding the importance of this vaccine but patient still declined. Advised may receive this vaccine at local pharmacy or Health Dept. Aware to provide a copy of the vaccination record if obtained from local pharmacy or Health Dept. Verbalized acceptance and understanding.   Covid-19 vaccine status: Declined, Education has been provided regarding the importance of this vaccine but patient still declined. Advised may receive this vaccine at local pharmacy or Health Dept.or vaccine clinic. Aware to provide a copy of the vaccination record if obtained from local pharmacy or Health Dept. Verbalized acceptance and understanding.  Qualifies for Shingles Vaccine? Yes   Zostavax completed No   Shingrix Completed?: No.    Education has been provided regarding the importance of this vaccine. Patient has been advised to call insurance company to determine out of pocket expense if they have not yet received this vaccine. Advised may also receive vaccine at local pharmacy or Health Dept. Verbalized acceptance and understanding.  Screening Tests Health Maintenance  Topic Date Due   COVID-19 Vaccine (1) Never done   DTaP/Tdap/Td (1 - Tdap) Never done   Zoster Vaccines- Shingrix (1 of 2) Never done   INFLUENZA VACCINE  09/17/2021   Medicare Annual Wellness (AWV)  01/29/2023   COLONOSCOPY (Pts 45-55yrs Insurance coverage will need  to be confirmed)  02/02/2024   Hepatitis C Screening  Completed   HIV Screening  Completed   HPV VACCINES  Aged Out    Health Maintenance  Health Maintenance Due  Topic Date Due   COVID-19 Vaccine (1) Never done   DTaP/Tdap/Td (1 - Tdap) Never done   Zoster Vaccines- Shingrix (1 of 2) Never done   INFLUENZA VACCINE  09/17/2021    Colorectal cancer screening: Type of screening: Colonoscopy. Completed 02/01/21. Repeat every 3 years  Lung Cancer Screening: (Low Dose CT Chest recommended if Age 76-80 years, 30 pack-year currently smoking OR have quit w/in 15years.) does not qualify.    Additional Screening:  Hepatitis C Screening: does qualify; Completed 06/12/21  Vision Screening: Recommended annual ophthalmology exams for early detection of glaucoma and other disorders of the eye. Is the patient up to date with their annual eye exam?  Yes  Who is the provider or what is the name of the office in which the patient attends annual eye exams?  MD in [place] If pt is not established with a provider, would they like to be referred to a provider to establish care? No .   Dental Screening: Recommended annual dental exams for proper oral hygiene  Community Resource Referral / Chronic Care Management: CRR required this visit?  No   CCM required this visit?  No      Plan:     I have personally reviewed and noted the following in the patient's chart:   Medical and social history Use of alcohol, tobacco or illicit drugs  Current medications and supplements including opioid prescriptions. Patient is currently taking opioid prescriptions. Information provided to patient regarding non-opioid alternatives. Patient advised to discuss non-opioid treatment plan with their provider. Functional ability and status Nutritional status Physical activity Advanced directives List of other physicians Hospitalizations, surgeries, and ER visits in previous 12 months Vitals Screenings to include  cognitive, depression, and falls Referrals and appointments  In addition, I have reviewed and discussed with patient certain preventive protocols, quality metrics, and best practice recommendations. A written personalized care plan for preventive services as well as general preventive health recommendations were provided to patient.     Hal Hope, LPN   [id number]/[id number]   Nurse Notes: none

## 2022-01-29 ENCOUNTER — Ambulatory Visit: Payer: Medicare Other | Admitting: Family Medicine

## 2022-02-04 ENCOUNTER — Ambulatory Visit: Payer: Medicare Other | Admitting: Family Medicine

## 2022-02-04 NOTE — Progress Notes (Deleted)
      Established patient visit   Patient: [name] [name]   DOB: 1961-02-04   61 y.o. Male  MRN: [id number] Visit Date: 02/04/2022  Today's healthcare provider: Jacky Kindle, FNP   No chief complaint on file.  Subjective    HPI  Follow up for Cpap  The patient was last seen for this 3 months ago. Changes made at last visit include none.  He reports {excellent/good/fair/poor:19665} compliance with treatment. He feels that condition is {improved/worse/unchanged:3041574}. He {is/is not:21021397} having side effects. ***  -----------------------------------------------------------------------------------------   Medications: Outpatient Medications Prior to Visit  Medication Sig   busPIRone (BUSPAR) 7.5 MG tablet Take 1 tablet (7.5 mg total) by mouth 3 (three) times daily.   levothyroxine (SYNTHROID) 50 MCG tablet TAKE 1 TABLET(50 MCG) BY MOUTH DAILY   meloxicam (MOBIC) 7.5 MG tablet Take 1 tablet (7.5 mg total) by mouth daily. (Patient not taking: Reported on 01/28/2022)   NON FORMULARY cpap device   omeprazole (PRILOSEC) 20 MG capsule Take 20 mg by mouth daily.   oxyCODONE (OXY IR/ROXICODONE) 5 MG immediate release tablet Take 1 tablet (5 mg total) by mouth every 4 (four) hours as needed for severe pain. (Patient not taking: Reported on 01/28/2022)   pregabalin (LYRICA) 100 MG capsule Take 1 capsule (100 mg total) by mouth 3 (three) times daily for 14 days.   rosuvastatin (CRESTOR) 10 MG tablet Take 1 tablet (10 mg total) by mouth daily.   tamsulosin (FLOMAX) 0.4 MG CAPS capsule Take 1 capsule (0.4 mg total) by mouth daily. (Patient not taking: Reported on 01/28/2022)   No facility-administered medications prior to visit.    Review of Systems  {Labs  Heme  Chem  Endocrine  Serology  Results Review (optional):23779}   Objective    There were no vitals taken for this visit. {Show previous vital signs (optional):23777}  Physical Exam  ***  No results found for any  visits on 02/04/22.  Assessment & Plan     ***  No follow-ups on file.      {provider attestation***:1}   Jacky Kindle, FNP  [facility] [phone] (phone) [phone] (fax)  [facility]

## 2022-03-03 ENCOUNTER — Encounter: Payer: Self-pay | Admitting: Surgery

## 2022-03-03 ENCOUNTER — Ambulatory Visit (INDEPENDENT_AMBULATORY_CARE_PROVIDER_SITE_OTHER): Payer: Medicare Other | Admitting: Surgery

## 2022-03-03 VITALS — BP 104/71 | HR 73 | Temp 98.1°F | Ht 73.0 in | Wt 345.2 lb

## 2022-03-03 DIAGNOSIS — R1013 Epigastric pain: Secondary | ICD-10-CM | POA: Diagnosis not present

## 2022-03-03 DIAGNOSIS — G8929 Other chronic pain: Secondary | ICD-10-CM | POA: Diagnosis not present

## 2022-03-03 DIAGNOSIS — R1030 Lower abdominal pain, unspecified: Secondary | ICD-10-CM | POA: Diagnosis not present

## 2022-03-03 DIAGNOSIS — F3289 Other specified depressive episodes: Secondary | ICD-10-CM

## 2022-03-03 NOTE — Patient Instructions (Addendum)
Your CT is scheduled for 03/07/2022 3 pm (arrive by 12:45 pm) at [facility]. Nothing to eat or drink 4 hours prior.    If you have any concerns or questions, please feel free to call our office. See follow up appointment below.   Abdominal Pain, Adult Many things can cause belly (abdominal) pain. Most times, belly pain is not dangerous. Many cases of belly pain can be watched and treated at home. Sometimes, though, belly pain is serious. Your doctor will try to find the cause of your belly pain. Follow these instructions at home:  Medicines Take over-the-counter and prescription medicines only as told by your doctor. Do not take medicines that help you poop (laxatives) unless told by your doctor. General instructions Watch your belly pain for any changes. Drink enough fluid to keep your pee (urine) pale yellow. Keep all follow-up visits as told by your doctor. This is important. Contact a doctor if: Your belly pain changes or gets worse. You are not hungry, or you lose weight without trying. You are having trouble pooping (constipated) or have watery poop (diarrhea) for more than 2-3 days. You have pain when you pee or poop. Your belly pain wakes you up at night. Your pain gets worse with meals, after eating, or with certain foods. You are vomiting and cannot keep anything down. You have a fever. You have blood in your pee. Get help right away if: Your pain does not go away as soon as your doctor says it should. You cannot stop vomiting. Your pain is only in areas of your belly, such as the right side or the left lower part of the belly. You have bloody or black poop, or poop that looks like tar. You have very bad pain, cramping, or bloating in your belly. You have signs of not having enough fluid or water in your body (dehydration), such as: Dark pee, very little pee, or no pee. Cracked lips. Dry mouth. Sunken eyes. Sleepiness. Weakness. You have trouble breathing or  chest pain. Summary Many cases of belly pain can be watched and treated at home. Watch your belly pain for any changes. Take over-the-counter and prescription medicines only as told by your doctor. Contact a doctor if your belly pain changes or gets worse. Get help right away if you have very bad pain, cramping, or bloating in your belly. This information is not intended to replace advice given to you by your health care provider. Make sure you discuss any questions you have with your health care provider. Document Revised: 06/14/2018 Document Reviewed: 06/14/2018 Elsevier Patient Education  [place].

## 2022-03-06 ENCOUNTER — Encounter: Payer: Self-pay | Admitting: Surgery

## 2022-03-06 NOTE — Progress Notes (Signed)
Outpatient Surgical Follow Up  03/06/2022  [name] [name] is an 62 y.o. male.   Chief Complaint  Patient presents with   Follow-up    Abdominal pain    HPI: 62 year old male well-known to me with a high prior history of recurrent complicated diverticulitis with perforation that underwent uneventful robotic sigmoidectomy 7 months ago. He comes accompanied by his wife.  They state that over the last couple months his pain is back is in the same area suprapubic and lower mid abdomen.  Dull and sharp intermittent moderate to severe intensity.  Apparently this pain is disabling.  Fevers no chills.  3 months ago he did have a CT scan of the abdomen pelvis that I personally reviewed showing no evidence of complications related to his colectomy. Patient reports that he was pain-free for 2 months after the surgery and then his chronic pain came back. The wife actually pulled me aside and told me about how disabling the pin was and how frustrated they are.  They have been to [facility] but are no longer with them. Wife was requesting some form of relief including narcotics. I had an hones and candid conversation. I do not fill comfortable prescribing narcotics  7 months out from surgery. I also told her that I encourage him to go to the ER to seek relief and more immediate w/u   Past Medical History:  Diagnosis Date   Acid reflux    Anxiety    Aortic root dilatation ([place]) 12/30/2019   a.) TTE 12/30/2019: measured 39 mm.   Arthritis    Carpal tunnel syndrome, bilateral    Depression    Diastolic dysfunction 84/69/6295   a.) TTE 12/30/2019: norm LV size; mod LVH; EF 28-41% with diastolic dysfunction (details Re: grade not available); norm RV size and function; mod LAE; triv MR/TR; norm AoV.   Diverticulitis    Dyspnea    Fibromyalgia    GERD (gastroesophageal reflux disease)    Glaucoma    Hepatitis C    High cholesterol    History of kidney stones    Hypothyroidism    Juvenile epilepsy  ([place])    OSA on CPAP    Positive TB test     Past Surgical History:  Procedure Laterality Date   COLONOSCOPY WITH PROPOFOL N/A 02/01/2021   Procedure: COLONOSCOPY WITH PROPOFOL;  Surgeon: Lesly Rubenstein, MD;  Location: [facility] ENDOSCOPY;  Service: Endoscopy;  Laterality: N/A;   ESOPHAGOGASTRODUODENOSCOPY (EGD) WITH PROPOFOL N/A 02/01/2021   Procedure: ESOPHAGOGASTRODUODENOSCOPY (EGD) WITH PROPOFOL;  Surgeon: Lesly Rubenstein, MD;  Location: [facility] ENDOSCOPY;  Service: Endoscopy;  Laterality: N/A;   LIVER BIOPSY      Family History  Problem Relation Age of Onset   Aneurysm Mother    Cancer Father    Lung cancer Father    Mental illness Sister    Mental illness Sister    Brain cancer Maternal Grandmother    Emphysema Maternal Grandfather    Drug abuse Maternal Uncle    Drug abuse Paternal Aunt     Social History:  reports that he has never smoked. He has been exposed to tobacco smoke. He has never used smokeless tobacco. He reports that he does not currently use alcohol. He reports current drug use. Frequency: 2.50 times per week. Drug: Marijuana.  Allergies:  Allergies  Allergen Reactions   Tylenol [Acetaminophen] Other (See Comments)    Was told not to take d/t having hep c treatment   Celecoxib Anxiety  Zoloft [Sertraline Hcl] Anxiety    Medications reviewed.    ROS Full ROS performed and is otherwise negative other than what is stated in HPI   BP 104/71   Pulse 73   Temp 98.1 F (36.7 C) (Oral)   Ht 6\' 1"  (1.854 m)   Wt (!) 345 lb 3.2 oz (156.6 kg)   SpO2 96%   BMI 45.54 kg/m   Physical Exam Vitals and nursing note reviewed. Exam conducted with a chaperone present.  Constitutional:      Appearance: Normal appearance. He is obese. He is not ill-appearing or toxic-appearing.  Cardiovascular:     Rate and Rhythm: Normal rate.  Pulmonary:     Effort: Pulmonary effort is normal.     Breath sounds: No stridor.  Abdominal:     General: Abdomen is flat.  There is no distension.     Palpations: Abdomen is soft. There is no mass.     Tenderness: There is abdominal tenderness. There is no guarding or rebound.     Hernia: No hernia is present.     Comments: Some mild tenderness suprapubic area, no infection or abscess, no peritonitis  Musculoskeletal:        General: Normal range of motion.     Cervical back: Normal range of motion and neck supple. No rigidity or tenderness.  Skin:    General: Skin is warm and dry.     Capillary Refill: Capillary refill takes less than 2 seconds.  Neurological:     General: No focal deficit present.     Mental Status: He is alert and oriented to person, place, and time.  Psychiatric:        Mood and Affect: Mood normal.        Behavior: Behavior normal.        Thought Content: Thought content normal.        Judgment: Judgment normal.     Assessment/Plan: Recurrent abd pain. Extensive discussion with them. I do recommend further w/u CT A/P to further elucidate his problem also offered them to have him seen in the ER. I will place referral for pain clinic and psychiatry for depression. Not suicidal at this time. I have spent 45 minutes in this encounter including personally reviewing imaging studies, coordinating his care, placing orders, counseling the patient and family and performing appropriate documentation    Caroleen Hamman, MD [facility]

## 2022-03-07 ENCOUNTER — Ambulatory Visit
Admission: RE | Admit: 2022-03-07 | Discharge: 2022-03-07 | Disposition: A | Discharge status: Home / Self Care | Payer: 59 | Source: Ambulatory Visit | Attending: Surgery | Admitting: Surgery

## 2022-03-07 DIAGNOSIS — R1030 Lower abdominal pain, unspecified: Secondary | ICD-10-CM | POA: Diagnosis not present

## 2022-03-07 LAB — POCT I-STAT CREATININE: Creatinine, Ser: 0.9 mg/dL (ref 0.61–1.24)

## 2022-03-07 MED ORDER — IOHEXOL 350 MG/ML SOLN
100.0000 mL | Freq: Once | INTRAVENOUS | Status: AC | PRN
  Administered 2022-03-07: 100 mL via INTRAVENOUS

## 2022-03-10 ENCOUNTER — Telehealth: Payer: Self-pay

## 2022-03-10 DIAGNOSIS — G4733 Obstructive sleep apnea (adult) (pediatric): Secondary | ICD-10-CM | POA: Diagnosis not present

## 2022-03-10 NOTE — Telephone Encounter (Signed)
Per Dr.[name]- normal CT-keep follow up appointment.

## 2022-03-19 ENCOUNTER — Ambulatory Visit: Payer: Medicare Other | Admitting: Internal Medicine

## 2022-03-24 ENCOUNTER — Encounter: Payer: Self-pay | Admitting: Surgery

## 2022-03-24 ENCOUNTER — Other Ambulatory Visit: Payer: Self-pay

## 2022-03-24 ENCOUNTER — Ambulatory Visit (INDEPENDENT_AMBULATORY_CARE_PROVIDER_SITE_OTHER): Payer: 59 | Admitting: Surgery

## 2022-03-24 VITALS — BP 127/80 | HR 73 | Temp 98.1°F | Ht 73.0 in | Wt 342.0 lb

## 2022-03-24 DIAGNOSIS — R1084 Generalized abdominal pain: Secondary | ICD-10-CM | POA: Diagnosis not present

## 2022-03-24 DIAGNOSIS — Z9049 Acquired absence of other specified parts of digestive tract: Secondary | ICD-10-CM

## 2022-03-24 DIAGNOSIS — G8929 Other chronic pain: Secondary | ICD-10-CM | POA: Diagnosis not present

## 2022-03-24 NOTE — Patient Instructions (Signed)
Referral to pain clinic. Someone from their office will contact you to schedule an appointment.

## 2022-03-25 ENCOUNTER — Encounter: Payer: Self-pay | Admitting: Family Medicine

## 2022-03-25 ENCOUNTER — Ambulatory Visit (INDEPENDENT_AMBULATORY_CARE_PROVIDER_SITE_OTHER): Payer: 59 | Admitting: Family Medicine

## 2022-03-25 VITALS — BP 124/78 | HR 78 | Temp 97.4°F | Wt 345.0 lb

## 2022-03-25 DIAGNOSIS — E782 Mixed hyperlipidemia: Secondary | ICD-10-CM

## 2022-03-25 DIAGNOSIS — K2100 Gastro-esophageal reflux disease with esophagitis, without bleeding: Secondary | ICD-10-CM | POA: Diagnosis not present

## 2022-03-25 DIAGNOSIS — R1115 Cyclical vomiting syndrome unrelated to migraine: Secondary | ICD-10-CM | POA: Diagnosis not present

## 2022-03-25 DIAGNOSIS — R7303 Prediabetes: Secondary | ICD-10-CM | POA: Diagnosis not present

## 2022-03-25 DIAGNOSIS — M5441 Lumbago with sciatica, right side: Secondary | ICD-10-CM | POA: Diagnosis not present

## 2022-03-25 DIAGNOSIS — M5442 Lumbago with sciatica, left side: Secondary | ICD-10-CM | POA: Diagnosis not present

## 2022-03-25 DIAGNOSIS — G8929 Other chronic pain: Secondary | ICD-10-CM

## 2022-03-25 DIAGNOSIS — R296 Repeated falls: Secondary | ICD-10-CM

## 2022-03-25 DIAGNOSIS — K449 Diaphragmatic hernia without obstruction or gangrene: Secondary | ICD-10-CM | POA: Diagnosis not present

## 2022-03-25 DIAGNOSIS — Z7409 Other reduced mobility: Secondary | ICD-10-CM

## 2022-03-25 MED ORDER — OXYCODONE HCL 5 MG PO TABS: 5.0000 mg | ORAL_TABLET | ORAL | 0 refills | Status: AC | PRN

## 2022-03-25 NOTE — Assessment & Plan Note (Signed)
During episodes of pain; pt feels off balance and has fallen Denies current symptoms or injury Request for heavy duty/bariatric cane to assist; written Rx provided

## 2022-03-25 NOTE — Assessment & Plan Note (Signed)
Referral to ortho and pain mgmt given previous clearance from Uro and general surgery; failed PT with [facility]- looking for a second opinion  Request for low dose Oxy to assist; PDMP reviewed

## 2022-03-25 NOTE — Assessment & Plan Note (Signed)
Chronic, previously elevated On statin Repeat LP Goal <100 given hx of pre-diabetes

## 2022-03-25 NOTE — Assessment & Plan Note (Signed)
Chronic, stable Request for assistance with handicap placard given falls and poor strength/endurance Agreeable to bariatric consult for possible weight loss options to assist with chronic conditions Previously seen in [place] with weight management prior to concern for diverticulitis

## 2022-03-25 NOTE — Assessment & Plan Note (Signed)
Daily vomiting; within 30 minutes of waking Continue to recommend balanced, lower carb meals. Smaller meal size, adding snacks. Choosing water as drink of choice and increasing purposeful exercise.

## 2022-03-25 NOTE — Assessment & Plan Note (Signed)
Chronic, worsening Unable to eat large portions, does not eat regular meals Endorses vomiting as well as nausea daily Recommend referral back to GI for follow up given worsening complaints from previous imaging 12/2020

## 2022-03-25 NOTE — Assessment & Plan Note (Signed)
Chronic, stable previously with diet/exercise Continue to recommend balanced, lower carb meals. Smaller meal size, adding snacks. Choosing water as drink of choice and increasing purposeful exercise. Repeat A1c

## 2022-03-25 NOTE — Progress Notes (Signed)
I,Connie R Striblin,acting as a Education administrator for [name] Sprout, FNP.,have documented all relevant documentation on the behalf of [name] Sprout, FNP,as directed by  [name] Sprout, FNP while in the presence of [name] Sprout, FNP.  Established patient visit  Patient: [name] [name]   DOB: 1Feb 08, 1962   62 y.o. Male  MRN: [id number] Visit Date: 03/25/2022  Today's healthcare provider: [name], FNP  Introduced to nurse practitioner role and practice setting.  All questions answered.  Discussed provider/patient relationship and expectations.  Chief Complaint  Patient presents with   Pain   Subjective    HPI  Groin Pain:   He reports chronic groin pain. was not an injury that may have caused the pain. The pain started  2 years ago and is worsening. The pain does radiate to gluteal region . The pain is described as aching and soreness, is severe in intensity, occurring constantly. Symptoms are worse in the: morning  Aggravating factors: sitting and walking Relieving factors: recumbency.    Associated symptoms include nausea, vomiting, and loss of balance. Pt aslo states that this pain is causing depression.  Pt has seen surgeon and urology Pt was diagnosed with diverticulitis about 2 years ago, pain was decreased with surgery, which since then has come back  Seen in September at ER for pain, he was mis-diagnosed with prostatitis, was referred to urology,  which he was cleared,  and was referred back to surgeon, surgeon cleared him yesterday Pt is requesting referral to GI and pain management    Pt is requesting a cane to help with balance while walking, and a handicap sticker for car  --------------------------------------------------------------------------------------------------  Medications: Outpatient Medications Prior to Visit  Medication Sig   busPIRone (BUSPAR) 7.5 MG tablet Take 1 tablet (7.5 mg total) by mouth 3 (three) times daily.   levothyroxine (SYNTHROID) 50 MCG tablet TAKE 1  TABLET(50 MCG) BY MOUTH DAILY   NON FORMULARY cpap device   omeprazole (PRILOSEC) 20 MG capsule Take 20 mg by mouth daily.   rosuvastatin (CRESTOR) 10 MG tablet Take 1 tablet (10 mg total) by mouth daily.   [DISCONTINUED] pregabalin (LYRICA) 100 MG capsule Take 1 capsule (100 mg total) by mouth 3 (three) times daily for 14 days.   No facility-administered medications prior to visit.    Review of Systems    Objective    BP 124/78 (BP Location: Right Arm, Patient Position: Supine, Cuff Size: Large)   Pulse 78   Temp (!) 97.4 F (36.3 C) (Oral)   Wt (!) 345 lb (156.5 kg)   SpO2 96%   BMI 45.52 kg/m   Physical Exam Vitals and nursing note reviewed.  Constitutional:      Appearance: Normal appearance. He is obese.  HENT:     Head: Normocephalic and atraumatic.  Eyes:     Pupils: Pupils are equal, round, and reactive to light.  Cardiovascular:     Rate and Rhythm: Normal rate and regular rhythm.     Pulses: Normal pulses.     Heart sounds: Normal heart sounds.  Pulmonary:     Effort: Pulmonary effort is normal.     Breath sounds: Normal breath sounds.  Abdominal:     General: Bowel sounds are normal.     Palpations: Abdomen is soft.     Tenderness: There is no abdominal tenderness. There is no right CVA tenderness or left CVA tenderness.  Musculoskeletal:        General: Normal range  of motion.     Cervical back: Normal range of motion.  Skin:    General: Skin is warm and dry.     Capillary Refill: Capillary refill takes less than 2 seconds.  Neurological:     General: No focal deficit present.     Mental Status: He is alert and oriented to person, place, and time. Mental status is at baseline.  Psychiatric:        Mood and Affect: Mood is depressed. Affect is flat.        Behavior: Behavior normal.        Thought Content: Thought content normal.        Judgment: Judgment normal.    No results found for any visits on 03/25/22.  Assessment & Plan     Problem List  Items Addressed This Visit       Respiratory   Hiatal hernia with GERD and esophagitis    Chronic, worsening Unable to eat large portions, does not eat regular meals Endorses vomiting as well as nausea daily Recommend referral back to GI for follow up given worsening complaints from previous imaging 12/2020      Relevant Orders   Ambulatory referral to Gastroenterology   CBC with Differential/Platelet   Comprehensive Metabolic Panel (CMET)   Lipase   Amylase     Digestive   Cyclical vomiting syndrome not associated with migraine    Daily vomiting; within 30 minutes of waking Continue to recommend balanced, lower carb meals. Smaller meal size, adding snacks. Choosing water as drink of choice and increasing purposeful exercise.       Relevant Orders   Ambulatory referral to Gastroenterology   CBC with Differential/Platelet   Comprehensive Metabolic Panel (CMET)   Lipase   Amylase     Nervous and Auditory   Chronic bilateral low back pain with bilateral sciatica - Primary    Referral to ortho and pain mgmt given previous clearance from Uro and general surgery; failed PT with [facility]- looking for a second opinion  Request for low dose Oxy to assist; PDMP reviewed      Relevant Medications   oxyCODONE (OXY IR/ROXICODONE) 5 MG immediate release tablet   Other Relevant Orders   Ambulatory referral to Orthopedic Surgery   Ambulatory referral to Pain Clinic   Amb Referral to Bariatric Surgery     Other   Frequent falls    During episodes of pain; pt feels off balance and has fallen Denies current symptoms or injury Request for heavy duty/bariatric cane to assist; written Rx provided       Impaired mobility and endurance   Mixed hyperlipidemia    Chronic, previously elevated On statin Repeat LP Goal <100 given hx of pre-diabetes       Relevant Orders   Lipid panel   Morbid obesity (HCC)    Chronic, stable Request for assistance with handicap placard given  falls and poor strength/endurance Agreeable to bariatric consult for possible weight loss options to assist with chronic conditions Previously seen in [place] with weight management prior to concern for diverticulitis       Relevant Orders   Amb Referral to Bariatric Surgery   CBC with Differential/Platelet   Comprehensive Metabolic Panel (CMET)   Lipase   Amylase   Prediabetes    Chronic, stable previously with diet/exercise Continue to recommend balanced, lower carb meals. Smaller meal size, adding snacks. Choosing water as drink of choice and increasing purposeful exercise. Repeat A1c  Relevant Orders   Hemoglobin A1c   Return in about 4 weeks (around 04/22/2022) for chonic disease management.     Vonna Kotyk, FNP, have reviewed all documentation for this visit. The documentation on 03/25/22 for the exam, diagnosis, procedures, and orders are all accurate and complete.  [name] [name], [place] [phone] (phone) [phone] (fax)  [place]

## 2022-03-26 ENCOUNTER — Other Ambulatory Visit: Payer: Self-pay | Admitting: Family Medicine

## 2022-03-26 DIAGNOSIS — E039 Hypothyroidism, unspecified: Secondary | ICD-10-CM

## 2022-03-26 LAB — COMPREHENSIVE METABOLIC PANEL
ALT: 22 IU/L (ref 0–44)
AST: 19 IU/L (ref 0–40)
Albumin/Globulin Ratio: 1.5 (ref 1.2–2.2)
Albumin: 4.5 g/dL (ref 3.8–4.9)
Alkaline Phosphatase: 71 IU/L (ref 44–121)
BUN/Creatinine Ratio: 22 — ABNORMAL HIGH (ref 9–20)
BUN: 20 mg/dL (ref 6–24)
Bilirubin Total: 0.6 mg/dL (ref 0.0–1.2)
CO2: 25 mmol/L (ref 20–29)
Calcium: 9.9 mg/dL (ref 8.7–10.2)
Chloride: 103 mmol/L (ref 96–106)
Creatinine, Ser: 0.89 mg/dL (ref 0.76–1.27)
Globulin, Total: 3 g/dL (ref 1.5–4.5)
Glucose: 100 mg/dL — ABNORMAL HIGH (ref 70–99)
Potassium: 4.4 mmol/L (ref 3.5–5.2)
Sodium: 141 mmol/L (ref 134–144)
Total Protein: 7.5 g/dL (ref 6.0–8.5)
eGFR: 99 mL/min/{1.73_m2} (ref >59)

## 2022-03-26 LAB — CBC WITH DIFFERENTIAL/PLATELET
Basophils Absolute: 0.1 10*3/uL (ref 0.0–0.2)
Basos: 1 %
EOS (ABSOLUTE): 0.1 10*3/uL (ref 0.0–0.4)
Eos: 1 %
Hematocrit: 45.1 % (ref 37.5–51.0)
Hemoglobin: 15 g/dL (ref 13.0–17.7)
Immature Grans (Abs): 0 10*3/uL (ref 0.0–0.1)
Immature Granulocytes: 0 %
Lymphocytes Absolute: 1.7 10*3/uL (ref 0.7–3.1)
Lymphs: 22 %
MCH: 28.9 pg (ref 26.6–33.0)
MCHC: 33.3 g/dL (ref 31.5–35.7)
MCV: 87 fL (ref 79–97)
Monocytes Absolute: 0.7 10*3/uL (ref 0.1–0.9)
Monocytes: 9 %
Neutrophils Absolute: 5.1 10*3/uL (ref 1.4–7.0)
Neutrophils: 67 %
Platelets: 203 10*3/uL (ref 150–450)
RBC: 5.19 x10E6/uL (ref 4.14–5.80)
RDW: 12.3 % (ref 11.6–15.4)
WBC: 7.7 10*3/uL (ref 3.4–10.8)

## 2022-03-26 LAB — LIPID PANEL
Chol/HDL Ratio: 3.9 ratio (ref 0.0–5.0)
Cholesterol, Total: 171 mg/dL (ref 100–199)
HDL: 44 mg/dL (ref >39)
LDL Chol Calc (NIH): 92 mg/dL (ref 0–99)
Triglycerides: 205 mg/dL — ABNORMAL HIGH (ref 0–149)
VLDL Cholesterol Cal: 35 mg/dL (ref 5–40)

## 2022-03-26 LAB — LIPASE: Lipase: 42 U/L (ref 13–78)

## 2022-03-26 LAB — HEMOGLOBIN A1C
Est. average glucose Bld gHb Est-mCnc: 111 mg/dL
Hgb A1c MFr Bld: 5.5 % (ref 4.8–5.6)

## 2022-03-26 LAB — AMYLASE: Amylase: 86 U/L (ref 31–110)

## 2022-03-26 NOTE — Progress Notes (Signed)
All labs are normal and stable; which this is reassuring news. Continue with specialist care as previously discussed.  [name] [name], [place] [address] [place], [place] [address] [phone] (phone) [phone] (fax) [place]

## 2022-03-27 ENCOUNTER — Encounter: Payer: Self-pay | Admitting: Surgery

## 2022-03-27 NOTE — Progress Notes (Signed)
Outpatient Surgical Follow Up  03/27/2022  [name] [name] is an 62 y.o. male.   Chief Complaint  Patient presents with   Follow-up    Sigmoid colectomy    HPI: Chronic abdominal pain.  Did have a colectomy last year with relief of pain for couple months and the pain came back.  They are frustrated because of the chronicity of the pain.  I repeated another CT scan that have personally reviewed there is no evidence of complications there is no evidence of abscess there is no evidence of hernias and a good explanation for patient's symptoms at this time  Past Medical History:  Diagnosis Date   Acid reflux    Anxiety    Aortic root dilatation ([place]) 12/30/2019   a.) TTE 12/30/2019: measured 39 mm.   Arthritis    Carpal tunnel syndrome, bilateral    Depression    Diastolic dysfunction 73/22/0254   a.) TTE 12/30/2019: norm LV size; mod LVH; EF 27-06% with diastolic dysfunction (details Re: grade not available); norm RV size and function; mod LAE; triv MR/TR; norm AoV.   Diverticulitis    Dyspnea    Fibromyalgia    GERD (gastroesophageal reflux disease)    Glaucoma    Hepatitis C    High cholesterol    History of kidney stones    Hypothyroidism    Juvenile epilepsy ([place])    OSA on CPAP    Positive TB test     Past Surgical History:  Procedure Laterality Date   COLONOSCOPY WITH PROPOFOL N/A 02/01/2021   Procedure: COLONOSCOPY WITH PROPOFOL;  Surgeon: Lesly Rubenstein, MD;  Location: [facility] ENDOSCOPY;  Service: Endoscopy;  Laterality: N/A;   ESOPHAGOGASTRODUODENOSCOPY (EGD) WITH PROPOFOL N/A 02/01/2021   Procedure: ESOPHAGOGASTRODUODENOSCOPY (EGD) WITH PROPOFOL;  Surgeon: Lesly Rubenstein, MD;  Location: [facility] ENDOSCOPY;  Service: Endoscopy;  Laterality: N/A;   LIVER BIOPSY      Family History  Problem Relation Age of Onset   Aneurysm Mother    Cancer Father    Lung cancer Father    Mental illness Sister    Mental illness Sister    Brain cancer Maternal Grandmother     Emphysema Maternal Grandfather    Drug abuse Maternal Uncle    Drug abuse Paternal Aunt     Social History:  reports that he has never smoked. He has been exposed to tobacco smoke. He has never used smokeless tobacco. He reports that he does not currently use alcohol. He reports current drug use. Frequency: 2.50 times per week. Drug: Marijuana.  Allergies:  Allergies  Allergen Reactions   Tylenol [Acetaminophen] Other (See Comments)    Was told not to take d/t having hep c treatment   Celecoxib Anxiety   Zoloft [Sertraline Hcl] Anxiety    Medications reviewed.    ROS Full ROS performed and is otherwise negative other than what is stated in HPI   BP 127/80   Pulse 73   Temp 98.1 F (36.7 C) (Oral)   Ht 6\' 1"  (1.854 m)   Wt (!) 342 lb (155.1 kg)   SpO2 98%   BMI 45.12 kg/m   Physical Exam   Physical Exam Vitals and nursing note reviewed. Exam conducted with a chaperone present.  Constitutional:      Appearance: Normal appearance. He is obese. He is not ill-appearing or toxic-appearing.  Cardiovascular:     Rate and Rhythm: Normal rate.  Pulmonary:     Effort: Pulmonary effort is normal.  Breath sounds: No stridor.  Abdominal:     General: Abdomen is flat. There is no distension.     Palpations: Abdomen is soft. There is no mass.     Tenderness: There is abdominal tenderness. There is no guarding or rebound.     Hernia: No hernia is present.     Comments: Some mild tenderness suprapubic area, no infection or abscess, no peritonitis  Musculoskeletal:        General: Normal range of motion.     Cervical back: Normal range of motion and neck supple. No rigidity or tenderness.  Skin:    General: Skin is warm and dry.     Capillary Refill: Capillary refill takes less than 2 seconds.  Neurological:     General: No focal deficit present.     Mental Status: He is alert and oriented to person, place, and time.  Psychiatric:        Mood and Affect: Mood normal.         Behavior: Behavior normal.        Thought Content: Thought content normal.        Judgment: Judgment normal   Assessment/Plan: Chronic abdominal pain.  Has had 2 CT scans without evidence of complications related to his sigmoid colectomy.  At this time I have nothing much to offer.  They are frustrated because of the nature of his chronic abdominal pain.  I am unable to provide any narcotics and will send him to pain clinic.  For surgical intervention at this time     Caroleen Hamman, MD [place] Surgeon

## 2022-04-07 DIAGNOSIS — G4733 Obstructive sleep apnea (adult) (pediatric): Secondary | ICD-10-CM | POA: Diagnosis not present

## 2022-04-08 ENCOUNTER — Other Ambulatory Visit: Payer: Self-pay

## 2022-04-08 ENCOUNTER — Emergency Department
Admission: EM | Admit: 2022-04-08 | Discharge: 2022-04-08 | Disposition: A | Discharge status: Home / Self Care | Payer: 59 | Attending: Emergency Medicine | Admitting: Emergency Medicine

## 2022-04-08 ENCOUNTER — Emergency Department: Payer: 59

## 2022-04-08 DIAGNOSIS — Z20822 Contact with and (suspected) exposure to covid-19: Secondary | ICD-10-CM | POA: Insufficient documentation

## 2022-04-08 DIAGNOSIS — I10 Essential (primary) hypertension: Secondary | ICD-10-CM | POA: Diagnosis not present

## 2022-04-08 DIAGNOSIS — R112 Nausea with vomiting, unspecified: Secondary | ICD-10-CM

## 2022-04-08 DIAGNOSIS — R1111 Vomiting without nausea: Secondary | ICD-10-CM | POA: Diagnosis not present

## 2022-04-08 DIAGNOSIS — K8020 Calculus of gallbladder without cholecystitis without obstruction: Secondary | ICD-10-CM | POA: Diagnosis not present

## 2022-04-08 DIAGNOSIS — B349 Viral infection, unspecified: Secondary | ICD-10-CM | POA: Diagnosis not present

## 2022-04-08 DIAGNOSIS — E878 Other disorders of electrolyte and fluid balance, not elsewhere classified: Secondary | ICD-10-CM | POA: Diagnosis not present

## 2022-04-08 DIAGNOSIS — R109 Unspecified abdominal pain: Secondary | ICD-10-CM | POA: Diagnosis not present

## 2022-04-08 DIAGNOSIS — R1110 Vomiting, unspecified: Secondary | ICD-10-CM | POA: Diagnosis not present

## 2022-04-08 DIAGNOSIS — Z743 Need for continuous supervision: Secondary | ICD-10-CM | POA: Diagnosis not present

## 2022-04-08 LAB — CBC
HCT: 45.7 % (ref 39.0–52.0)
Hemoglobin: 15.7 g/dL (ref 13.0–17.0)
MCH: 29.4 pg (ref 26.0–34.0)
MCHC: 34.4 g/dL (ref 30.0–36.0)
MCV: 85.6 fL (ref 80.0–100.0)
Platelets: 180 10*3/uL (ref 150–400)
RBC: 5.34 MIL/uL (ref 4.22–5.81)
RDW: 12.3 % (ref 11.5–15.5)
WBC: 14.9 10*3/uL — ABNORMAL HIGH (ref 4.0–10.5)
nRBC: 0 % (ref 0.0–0.2)

## 2022-04-08 LAB — RESP PANEL BY RT-PCR (RSV, FLU A&B, COVID)  RVPGX2
Influenza A by PCR: NEGATIVE
Influenza B by PCR: NEGATIVE
Resp Syncytial Virus by PCR: NEGATIVE
SARS Coronavirus 2 by RT PCR: NEGATIVE

## 2022-04-08 LAB — COMPREHENSIVE METABOLIC PANEL
ALT: 23 U/L (ref 0–44)
AST: 30 U/L (ref 15–41)
Albumin: 4.2 g/dL (ref 3.5–5.0)
Alkaline Phosphatase: 61 U/L (ref 38–126)
Anion gap: 14 (ref 5–15)
BUN: 21 mg/dL — ABNORMAL HIGH (ref 6–20)
CO2: 19 mmol/L — ABNORMAL LOW (ref 22–32)
Calcium: 9.4 mg/dL (ref 8.9–10.3)
Chloride: 104 mmol/L (ref 98–111)
Creatinine, Ser: 0.8 mg/dL (ref 0.61–1.24)
GFR, Estimated: >60 mL/min (ref >60)
Glucose, Bld: 121 mg/dL — ABNORMAL HIGH (ref 70–99)
Potassium: 4 mmol/L (ref 3.5–5.1)
Sodium: 137 mmol/L (ref 135–145)
Total Bilirubin: 1.2 mg/dL (ref 0.3–1.2)
Total Protein: 8.3 g/dL — ABNORMAL HIGH (ref 6.5–8.1)

## 2022-04-08 LAB — LIPASE, BLOOD: Lipase: 35 U/L (ref 11–51)

## 2022-04-08 MED ORDER — IOHEXOL 350 MG/ML SOLN
100.0000 mL | Freq: Once | INTRAVENOUS | Status: AC | PRN
  Administered 2022-04-08: 100 mL via INTRAVENOUS

## 2022-04-08 MED ORDER — HYDROMORPHONE HCL 1 MG/ML IJ SOLN
0.5000 mg | Freq: Once | INTRAMUSCULAR | Status: AC
  Administered 2022-04-08: 0.5 mg via INTRAVENOUS
  Filled 2022-04-08: qty 0.5

## 2022-04-08 MED ORDER — ONDANSETRON 4 MG PO TBDP: 4.0000 mg | ORAL_TABLET | Freq: Three times a day (TID) | ORAL | 0 refills | Status: AC | PRN

## 2022-04-08 MED ORDER — ONDANSETRON HCL 4 MG/2ML IJ SOLN
4.0000 mg | Freq: Once | INTRAMUSCULAR | Status: AC
  Administered 2022-04-08: 4 mg via INTRAVENOUS
  Filled 2022-04-08: qty 2

## 2022-04-08 MED ORDER — SODIUM CHLORIDE 0.9 % IV BOLUS
1000.0000 mL | Freq: Once | INTRAVENOUS | Status: AC
  Administered 2022-04-08: 1000 mL via INTRAVENOUS

## 2022-04-08 NOTE — ED Triage Notes (Addendum)
Pt presents to ED via [facility] with c/o of ABD (LLQ) and emesis since last night. Pt denies fevers or chills. NAD noted.   141/91 97% HR: 97

## 2022-04-08 NOTE — ED Provider Notes (Signed)
[facility] Provider Note    Event Date/Time   First MD Initiated Contact with Patient 04/08/22 1649     (approximate)   History   Emesis and Abdominal Pain   HPI  [name] [name] is a 62 y.o. male with history of diverticulitis, colectomy who comes in with abdominal pain. He reports waking up vomiting 5 times, and worsening pain in his lower abdomen. HE did have a bM this morning. Does have + flu contact. NO diarrhea.  Denies any chest pain, shortness of breath, falls or hitting his head.  Reports that his wife is also been sick with similar symptoms and all of his children have felt sick.  Wife with more diarrhea but also vomiting as well.      Physical Exam   Triage Vital Signs: ED Triage Vitals  Enc Vitals Group     BP 04/08/22 1424 (!) 137/110     Pulse Rate 04/08/22 1424 (!) 107     Resp 04/08/22 1424 18     Temp 04/08/22 1424 98.2 F (36.8 C)     Temp Source 04/08/22 1424 Oral     SpO2 04/08/22 1424 96 %     Weight --      Height --      Head Circumference --      Peak Flow --      Pain Score 04/08/22 1417 6     Pain Loc --      Pain Edu? --      Excl. in [place]? --     Most recent vital signs: Vitals:   04/08/22 1424  BP: (!) 137/110  Pulse: (!) 107  Resp: 18  Temp: 98.2 F (36.8 C)  SpO2: 96%     General: Awake, no distress.  CV:  Good peripheral perfusion.  Resp:  Normal effort.  Abd:  No distention.  Tender in the right lower abdomen. Other:     ED Results / Procedures / Treatments   Labs (all labs ordered are listed, but only abnormal results are displayed) Labs Reviewed  COMPREHENSIVE METABOLIC PANEL - Abnormal; Notable for the following components:      Result Value   CO2 19 (*)    Glucose, Bld 121 (*)    BUN 21 (*)    Total Protein 8.3 (*)    All other components within normal limits  CBC - Abnormal; Notable for the following components:   WBC 14.9 (*)    All other components within normal limits  RESP PANEL BY  RT-PCR (RSV, FLU A&B, COVID)  RVPGX2  LIPASE, BLOOD  URINALYSIS, ROUTINE W REFLEX MICROSCOPIC     RADIOLOGY I have reviewed the CT personally and interpreted and no evidence of any diverticulitis or appendicitis  PROCEDURES:  Critical Care performed: No  Procedures   MEDICATIONS ORDERED IN ED: Medications  sodium chloride 0.9 % bolus 1,000 mL (1,000 mLs Intravenous New Bag/Given 04/08/22 1749)  ondansetron (ZOFRAN) injection 4 mg (4 mg Intravenous Given 04/08/22 1750)  HYDROmorphone (DILAUDID) injection 0.5 mg (0.5 mg Intravenous Given 04/08/22 1750)  iohexol (OMNIPAQUE) 350 MG/ML injection 100 mL (100 mLs Intravenous Contrast Given 04/08/22 1759)     IMPRESSION / MDM / ASSESSMENT AND PLAN / ED COURSE  I reviewed the triage vital signs and the nursing notes.   Patient's presentation is most consistent with acute presentation with potential threat to life or bodily function.   Differential: viral, obstruction, UTI, diverticulitis.  Patient given some IV  fluids IV pain meds and IV Zofran while awaiting results of CT   CBC elevated white count Lipase normal CMP slightly low bicarb   IMPRESSION: 1. Fatty liver infiltration. 2. Cholelithiasis without evidence of acute cholecystitis. 3. Surgical changes from resection and primary anastomosis along the rectosigmoid colon. No evidence of bowel obstruction or dilatation. 4. Small hiatal hernia  Is aware of the gallstones in his gallbladder. Tenderness is not in the right upper quadrant is in the lower abdomen so doubt this is from his gallstones.  He also has similar symptoms to his wife who is also been sick.  Do not feel like ultrasound is necessary at this time.  Patient is wife is at bedside does report that he had a little bit of brownish looking vomiting after having already vomited multiple times.  There was concern for if there could have been blood in it.  Discussed with her that I do not see any evidence of any vomiting  for over 6 hours here.  We discussed rectal exam to look for any black tarry stools to suggest GI bleed but they declined denying any symptoms of this.  I suspect it could just be a Mallory-Weiss tear if he did have all the vomiting from earlier and then developed this.  They report they already have follow-up with GI in a few weeks and do not want to do rectal exam and given he has been stable hemodynamically I think it would be safe to discharge him they said they would return if they notice any black stool or any vomiting of blood.  Reevaluated patient and he is requesting be discharged home.  Consider admission but given his reassuring CT scan his heart rate has come down pain is resolved tolerating p.o.  I think would be fine for patient to be discharged home.  They did request something to help with pain at home and we discussed Tylenol given no findings on CT imaging that would require opioids.  They expressed understanding.  Will give some Zofran to help with nausea.  The patient is on the cardiac monitor to evaluate for evidence of arrhythmia and/or significant heart rate changes.      FINAL CLINICAL IMPRESSION(S) / ED DIAGNOSES   Final diagnoses:  Viral illness  Nausea and vomiting, unspecified vomiting type     Rx / DC Orders   ED Discharge Orders          Ordered    ondansetron (ZOFRAN-ODT) 4 MG disintegrating tablet  Every 8 hours PRN        04/08/22 2040             Note:  This document was prepared using Dragon voice recognition software and may include unintentional dictation errors.   Vanessa Poynor, MD 04/08/22 2041

## 2022-04-08 NOTE — Discharge Instructions (Addendum)
Your workup was reassuring try to stay well-hydrated with Gatorade without sugar, Pedialyte.  Take Zofran to help with any nausea.  Your CT imaging was as below without any other acute pathology.  There was normal hemoglobin today but if you notice any black stools or vomiting blood and please return to the ER for repeat hemoglobin test.  Please return for any other concerns   IMPRESSION: 1. Fatty liver infiltration. 2. Cholelithiasis without evidence of acute cholecystitis. 3. Surgical changes from resection and primary anastomosis along the rectosigmoid colon. No evidence of bowel obstruction or dilatation. 4. Small hiatal hernia

## 2022-04-08 NOTE — ED Notes (Signed)
Pt. Returned from CT.

## 2022-04-17 ENCOUNTER — Telehealth: Payer: Self-pay | Admitting: *Deleted

## 2022-04-17 NOTE — Telephone Encounter (Signed)
     Patient  visit on 04/08/2022  at [facility] was for virus  Have you been able to follow up with your primary care physician? Does have PCP and he is following up with Dr in the coming month and he has transportation and he is able to get his medications and is ok with food .  The patient was  able to obtain any needed medicine or equipment.  Are there diet recommendations that you are having difficulty following?  Patient expresses understanding of discharge instructions and education provided has no other needs at this time.    [place] [phone] , [place] [address] Email : [email or web address]

## 2022-04-22 ENCOUNTER — Telehealth: Payer: Self-pay

## 2022-04-22 ENCOUNTER — Other Ambulatory Visit: Payer: Self-pay | Admitting: Family Medicine

## 2022-04-22 MED ORDER — OMEPRAZOLE 40 MG PO CPDR: 40.0000 mg | DELAYED_RELEASE_CAPSULE | Freq: Two times a day (BID) | ORAL | 1 refills | Status: DC

## 2022-04-22 NOTE — Telephone Encounter (Signed)
His PCP can consider optimizing his omeprazole to 40 mg twice daily before meals until he is seen in our office  RV

## 2022-04-22 NOTE — Telephone Encounter (Signed)
We received a referral for gerd with vomiting and hiatal hernia. Patient has been seeing [facility] GI last seen them 11/14/20. Sent referral back to PCP and informed them they needed to send referral back to [facility] GI since he is a patient of there office. PCP responded back that patient wants to transfer his care to our office. Please advise if any of yall will see this patient.

## 2022-04-22 NOTE — Telephone Encounter (Signed)
Documented in referral

## 2022-04-23 NOTE — Telephone Encounter (Signed)
Patient advised.

## 2022-04-24 ENCOUNTER — Ambulatory Visit (INDEPENDENT_AMBULATORY_CARE_PROVIDER_SITE_OTHER): Payer: 59 | Admitting: Orthopedic Surgery

## 2022-04-24 ENCOUNTER — Encounter: Payer: Self-pay | Admitting: Orthopedic Surgery

## 2022-04-24 ENCOUNTER — Ambulatory Visit (INDEPENDENT_AMBULATORY_CARE_PROVIDER_SITE_OTHER): Payer: 59

## 2022-04-24 VITALS — BP 119/75 | HR 77 | Ht 73.0 in | Wt 345.0 lb

## 2022-04-24 DIAGNOSIS — M545 Low back pain, unspecified: Secondary | ICD-10-CM | POA: Diagnosis not present

## 2022-04-24 DIAGNOSIS — M5416 Radiculopathy, lumbar region: Secondary | ICD-10-CM

## 2022-04-24 MED ORDER — DIAZEPAM 5 MG PO TABS: 5.0000 mg | ORAL_TABLET | Freq: Once | ORAL | 0 refills | Status: DC | PRN

## 2022-04-24 NOTE — Progress Notes (Signed)
Orthopedic Spine Surgery Office Note  Assessment: Patient is a 62 y.o. male with low back pain that radiates into his groin. Has calcified disc at L1/2 on an ab/pelvis CT.   Plan: -Patient has tried physical therapy, NSAIDs, lyrica -He did 12 weeks of PT without any relief so recommended MRI of the lumbar spine to evaluate for radiculopathy. Valium prescribed for his anxiety  -Discussed the fact that he will need to lose weight in order to be a surgical candidate. Gave him a weight goal of 300lbs -Patient should return to office in 5 weeks, x-rays at next visit: none   Patient expressed understanding of the plan and all questions were answered to the patient's satisfaction.   ___________________________________________________________________________   History:  Patient is a 62 y.o. male who presents today for lumbar spine. Patient has had chronic low back pain, but within the last 3 years, he has noticed pain that radiates into the groin region. Pain is felt on a daily basis. He feels it with and without activity. Pain limits his ability to play with his son or even go on short walks. He has been in pain management but feels he did not get any relief with the treatments that were tried. He tried 12 weeks of PT too that did not give him any relief.    Weakness: feels his leg are weaker. No other weakness noted Symptoms of imbalance: denies Paresthesias and numbness: denies Bowel or bladder incontinence: denies Saddle anesthesia: denies  Treatments tried: physical therapy,, NSAIDs, lyrica  Review of systems: Denies fevers and chills, night sweats, unexplained weight loss, history of cancer. Has had pain that wakes him at night  Past medical history: Hepatitis GERD Depression/Anxiety HLD Chronic pain Fibromyalgia Glaucoma  Allergies: tylenol, celebrex, zoloft  Past surgical history:  Sigmoid colon resection  Social history: Denies use of nicotine product (smoking,  vaping, patches, smokeless) Alcohol use: denies Recreational drug use: marijuana, denies others   Physical Exam:  General: no acute distress, appears stated age Neurologic: alert, answering questions appropriately, following commands Respiratory: unlabored breathing on room air, symmetric chest rise Psychiatric: appropriate affect, normal cadence to speech   MSK (spine):  -Strength exam      Left  Right EHL    5/5  5/5 TA    5/5  5/5 GSC    5/5  5/5 Knee extension  5/5  5/5 Hip flexion   5/5  5/5  -Sensory exam    Sensation intact to light touch in L3-S1 nerve distributions of bilateral lower extremities  -Achilles DTR: 1/4 on the left, 1/4 on the right -Patellar tendon DTR: 1/4 on the left, 1/4 on the right  -Straight leg raise: negative bilaterally -Femoral nerve stretch test: negative bilaterally  -Clonus: no beats bilaterally  -Left hip exam: negative stinchfield, no pain through range of motion, negative FABER, negative SI joint compression test -Right hip exam: negative stinchfield, no pain through range of motion, negative FABER, negative SI joint compression test  Imaging: XR of the lumbar spine from 04/24/2022 was independently reviewed and interpreted, showing no significant degenerative changes. No fracture or dislocation. No evidence of instability on flexion/extension views.   CT of chest/abdomen/pelvis from 04/08/2022 showed a calcified disc herniation at L1/2.    Patient name: [name] [name] Patient MRN: [id number] Date of visit: 04/24/22

## 2022-05-05 ENCOUNTER — Ambulatory Visit (INDEPENDENT_AMBULATORY_CARE_PROVIDER_SITE_OTHER): Payer: 59 | Admitting: Family Medicine

## 2022-05-05 ENCOUNTER — Encounter: Payer: Self-pay | Admitting: Family Medicine

## 2022-05-05 VITALS — BP 113/73 | HR 61 | Temp 97.8°F | Resp 24 | Wt 343.0 lb

## 2022-05-05 DIAGNOSIS — M5116 Intervertebral disc disorders with radiculopathy, lumbar region: Secondary | ICD-10-CM

## 2022-05-05 MED ORDER — OXYCODONE HCL 5 MG PO TABS: 5.0000 mg | ORAL_TABLET | ORAL | 0 refills | Status: DC | PRN

## 2022-05-05 NOTE — Progress Notes (Signed)
I,Sulibeya S Dimas,acting as a Education administrator for [name], FNP.,have documented all relevant documentation on the behalf of [name], FNP,as directed by  [name], FNP while in the presence of [name], FNP.     Established patient visit   Patient: [name] [name]   DOB: 108-Aug-1962   62 y.o. Male  MRN: [id number] Visit Date: 05/05/2022  Today's healthcare provider: [name], FNP  Re Introduced to nurse practitioner role and practice setting.  All questions answered.  Discussed provider/patient relationship and expectations.   Chief Complaint  Patient presents with   Abdominal Pain   Hypothyroidism   Subjective    HPI  Hypothyroid, follow-up  Lab Results  Component Value Date   TSH 0.684 10/09/2021   TSH 0.405 (L) 06/12/2021   TSH 0.606 01/25/2020   FREET4 1.01 10/09/2021   FREET4 1.41 06/12/2021    Wt Readings from Last 3 Encounters:  05/05/22 (!) 343 lb (155.6 kg)  04/24/22 (!) 345 lb (156.5 kg)  03/25/22 (!) 345 lb (156.5 kg)    He was last seen for hypothyroid 6 months ago.  Management since that visit includes no changes. He reports excellent compliance with treatment. He is not having side effects.  ----------------------------------------------------------------------------------------- Follow up for abdominal pain  The patient was last seen for this 1 months ago. Changes made at last visit include referred to ortho and pain management. Rx for oxycodone #30 no refills. Patient reports he is scheduled for an MRI ordered by ortho.   He reports excellent compliance with treatment. He feels that condition is Unchanged. He is not having side effects.   -----------------------------------------------------------------------------------------   Medications: Outpatient Medications Prior to Visit  Medication Sig   busPIRone (BUSPAR) 7.5 MG tablet Take 1 tablet (7.5 mg total) by mouth 3 (three) times daily.   diazepam (VALIUM) 5 MG tablet Take 1  tablet (5 mg total) by mouth once as needed for up to 1 dose (for MRI scan).   levothyroxine (SYNTHROID) 50 MCG tablet Take 1 tablet (50 mcg total) by mouth daily before breakfast. Please schedule office visit before any future refill.   NON FORMULARY cpap device   omeprazole (PRILOSEC) 40 MG capsule Take 1 capsule (40 mg total) by mouth 2 (two) times daily before a meal.   rosuvastatin (CRESTOR) 10 MG tablet Take 1 tablet (10 mg total) by mouth daily.   No facility-administered medications prior to visit.    Review of Systems  Constitutional:  Positive for fatigue. Negative for appetite change.  Respiratory:  Negative for chest tightness and shortness of breath.   Cardiovascular:  Negative for chest pain and leg swelling.  Gastrointestinal:  Positive for abdominal pain and nausea. Negative for constipation, diarrhea and vomiting.  Musculoskeletal:  Positive for back pain and myalgias.     Objective    BP 113/73 (BP Location: Right Arm, Patient Position: Sitting, Cuff Size: Large)   Pulse 61   Temp 97.8 F (36.6 C) (Temporal)   Resp (!) 24   Wt (!) 343 lb (155.6 kg)   SpO2 98%   BMI 45.25 kg/m   Physical Exam Vitals and nursing note reviewed.  Constitutional:      Appearance: Normal appearance. He is well-developed. He is obese.  HENT:     Head: Normocephalic and atraumatic.  Eyes:     Pupils: Pupils are equal, round, and reactive to light.  Cardiovascular:     Rate and Rhythm: Normal rate and regular rhythm.  Pulses: Normal pulses.     Heart sounds: Normal heart sounds.  Pulmonary:     Effort: Pulmonary effort is normal.     Breath sounds: Normal breath sounds.  Musculoskeletal:        General: Tenderness present. Normal range of motion.     Cervical back: Normal range of motion.     Comments: Chronic low back pain  Skin:    General: Skin is warm and dry.     Capillary Refill: Capillary refill takes less than 2 seconds.  Neurological:     General: No focal  deficit present.     Mental Status: He is alert and oriented to person, place, and time. Mental status is at baseline.  Psychiatric:        Mood and Affect: Mood normal.        Behavior: Behavior normal.        Thought Content: Thought content normal.        Judgment: Judgment normal.      No results found for any visits on 05/05/22.  Assessment & Plan     Problem List Items Addressed This Visit       Nervous and Auditory   Lumbar disc disease with radiculopathy - Primary    Chronic, worsening with recent weight gain Followed by ortho Plan for nerve ablation Has to reach goal weight of 300# for surgical consideration Request for refills of pain medication to assist; PDMP reviewed       Relevant Medications   oxyCODONE (OXY IR/ROXICODONE) 5 MG immediate release tablet     Other   Morbid obesity (HCC)    Chronic, slight improvement Body mass index is 45.25 kg/m. Continue to recommend balanced, lower carb meals. Smaller meal size, adding snacks. Choosing water as drink of choice and increasing purposeful exercise.       Return if symptoms worsen or fail to improve.     Vonna Kotyk, FNP, have reviewed all documentation for this visit. The documentation on 05/05/22 for the exam, diagnosis, procedures, and orders are all accurate and complete.  [name], [place] [phone] (phone) [phone] (fax)  [place]

## 2022-05-05 NOTE — Assessment & Plan Note (Signed)
Chronic, slight improvement Body mass index is 45.25 kg/m. Continue to recommend balanced, lower carb meals. Smaller meal size, adding snacks. Choosing water as drink of choice and increasing purposeful exercise.

## 2022-05-05 NOTE — Assessment & Plan Note (Signed)
Chronic, worsening with recent weight gain Followed by ortho Plan for nerve ablation Has to reach goal weight of 300# for surgical consideration Request for refills of pain medication to assist; PDMP reviewed

## 2022-05-07 DIAGNOSIS — R2681 Unsteadiness on feet: Secondary | ICD-10-CM | POA: Diagnosis not present

## 2022-05-07 DIAGNOSIS — R262 Difficulty in walking, not elsewhere classified: Secondary | ICD-10-CM | POA: Diagnosis not present

## 2022-05-12 ENCOUNTER — Ambulatory Visit
Admission: RE | Admit: 2022-05-12 | Discharge: 2022-05-12 | Disposition: A | Discharge status: Home / Self Care | Payer: 59 | Source: Ambulatory Visit | Attending: Orthopedic Surgery | Admitting: Orthopedic Surgery

## 2022-05-12 DIAGNOSIS — M5416 Radiculopathy, lumbar region: Secondary | ICD-10-CM

## 2022-05-12 DIAGNOSIS — M47816 Spondylosis without myelopathy or radiculopathy, lumbar region: Secondary | ICD-10-CM | POA: Diagnosis not present

## 2022-05-12 DIAGNOSIS — M48061 Spinal stenosis, lumbar region without neurogenic claudication: Secondary | ICD-10-CM | POA: Diagnosis not present

## 2022-05-13 ENCOUNTER — Other Ambulatory Visit: Payer: Self-pay

## 2022-05-13 DIAGNOSIS — F3289 Other specified depressive episodes: Secondary | ICD-10-CM

## 2022-05-30 ENCOUNTER — Ambulatory Visit: Payer: 59 | Attending: Internal Medicine | Admitting: Internal Medicine

## 2022-05-30 ENCOUNTER — Encounter: Payer: Self-pay | Admitting: Internal Medicine

## 2022-05-30 VITALS — BP 130/70 | HR 65 | Wt 340.4 lb

## 2022-05-30 DIAGNOSIS — R0609 Other forms of dyspnea: Secondary | ICD-10-CM | POA: Diagnosis not present

## 2022-05-30 DIAGNOSIS — R002 Palpitations: Secondary | ICD-10-CM

## 2022-05-30 DIAGNOSIS — E7800 Pure hypercholesterolemia, unspecified: Secondary | ICD-10-CM

## 2022-05-30 DIAGNOSIS — I5032 Chronic diastolic (congestive) heart failure: Secondary | ICD-10-CM | POA: Diagnosis not present

## 2022-05-30 DIAGNOSIS — I5189 Other ill-defined heart diseases: Secondary | ICD-10-CM

## 2022-05-30 NOTE — Patient Instructions (Signed)
Medication Instructions:  Your Physician recommend you continue on your current medication as directed.     *If you need a refill on your cardiac medications before your next appointment, please call your pharmacy*   Lab Work: None ordered today   Testing/Procedures: Your physician has requested that you have an echocardiogram. Echocardiography is a painless test that uses sound waves to create images of your heart. It provides your doctor with information about the size and shape of your heart and how well your heart's chambers and valves are working.   You may receive an ultrasound enhancing agent through an IV if needed to better visualize your heart during the echo. This procedure takes approximately one hour.  There are no restrictions for this procedure.  This will take place at [address] [address], [place] [address]    Follow-Up: At [facility], you and your health needs are our priority.  As part of our continuing mission to provide you with exceptional heart care, we have created designated Provider Care Teams.  These Care Teams include your primary Cardiologist (physician) and Advanced Practice Providers (APPs -  Physician Assistants and Nurse Practitioners) who all work together to provide you with the care you need, when you need it.  We recommend signing up for the patient portal called "MyChart".  Sign up information is provided on this After Visit Summary.  MyChart is used to connect with patients for Virtual Visits (Telemedicine).  Patients are able to view lab/test results, encounter notes, upcoming appointments, etc.  Non-urgent messages can be sent to your provider as well.   To learn more about what you can do with MyChart, go to [email or web address].    Your next appointment:   3 month(s)  Provider:   You may see Yvonne Kendall, MD or one of the following Advanced Practice Providers on your designated Care Team:    Nicolasa Ducking, NP Eula Listen, PA-C Cadence Fransico Michael, PA-C Charlsie Quest, NP

## 2022-05-30 NOTE — Progress Notes (Unsigned)
Follow-up Outpatient Visit Date: 05/30/2022  Primary Care Provider: Jacky Kindle, FNP [address] [place] [address]  Chief Complaint: Follow-up diastolic dysfunction  HPI:  Mr. Gill is a 62 y.o. male with history of diastolic dysfunction, hyperlipidemia, hepatitis C, hypothyroidism, obstructive sleep apnea, seizure disorder as a child/young adult, diverticulitis, depression, anxiety, GERD, arthritis, and morbid obesity, who presents for follow-up of diastolic dysfunction.  I last saw him in 06/2021 for preoperative relation in anticipation of of sigmoid colectomy; he had been referred to [place] because of questionable history of diastolic heart failure.  At that time, he noted exertional dyspnea, which was longstanding and stable.  Echo performed in 2021 through [facility] medical showed normal LVEF with report of diastolic dysfunction, though no details were available.  No further testing or intervention was recommended.  Mr. Cassis reports that he recovered fairly well from surgery though has been bothered by chronic abdominal pain since then.  His surgeon has advised him that it does not seem to be related to the procedure itself.  He is now working with an orthopedist to see if back problems are contributing.  He was also evaluated by urology for possible prostatitis, though ultimately this diagnosis was discounted.  He may require surgery on his spine in the future, though his orthopedist advised him that he would need to lose weight to under 300 pounds.  Heart rate is now process of establishing with a bariatric surgeon to see if this can lose weight.  From a heart standpoint, Mr. Carrier has been feeling fairly well though he continues to have exertional dyspnea.  However, this is not as severe as when he had his first echocardiogram in 2021.  He denies chest pain but has experienced some intermittent palpitations that he describes as flutters.  They happen every few weeks, though he can have  several salvos over the course of the day.  They make him feel anxious.  Palpitations seem to have improved since omeprazole dose was increased.  He has not passed out, though he reports a mechanical fall a few days ago.  He endorses transient orthostatic lightheadedness after bending over.  He has not had any lower extremity edema.  He is compliant with CPAP.  --------------------------------------------------------------------------------------------------  Cardiovascular History & Procedures: Cardiovascular Problems: Diastolic dysfunction   Risk Factors: Hyperlipidemia, well gender, morbid obesity, and age greater than 45   Cath/PCI: None   CV Surgery: None   EP Procedures and Devices: None   Non-Invasive Evaluation(s): TTE (01/02/2020, [facility]): Normal LV size with moderate LVH.  LVEF 55-60% with diastolic dysfunction (details not available).  Normal RV size and function.  Moderate left atrial enlargement.  Trace mitral and tricuspid regurgitation.  Normal aortic valve.  Borderline dilated aortic root (3.9 cm).  Recent CV Pertinent Labs: Lab Results  Component Value Date   CHOL 171 03/25/2022   HDL 44 03/25/2022   LDLCALC 92 03/25/2022   TRIG 205 (H) 03/25/2022   CHOLHDL 3.9 03/25/2022   INR 1.0 06/12/2021   K 4.0 04/08/2022   MG 2.2 07/24/2021   BUN 21 (H) 04/08/2022   BUN 20 03/25/2022   CREATININE 0.80 04/08/2022    Past medical and surgical history were reviewed and updated in EPIC.  Current Meds  Medication Sig   busPIRone (BUSPAR) 7.5 MG tablet Take 1 tablet (7.5 mg total) by mouth 3 (three) times daily.   levothyroxine (SYNTHROID) 50 MCG tablet Take 1 tablet (50 mcg total) by mouth daily before  breakfast. Please schedule office visit before any future refill.   NON FORMULARY cpap device   omeprazole (PRILOSEC) 40 MG capsule Take 1 capsule (40 mg total) by mouth 2 (two) times daily before a meal.   oxyCODONE (OXY IR/ROXICODONE) 5 MG immediate release  tablet Take 1 tablet (5 mg total) by mouth every 4 (four) hours as needed for severe pain.   rosuvastatin (CRESTOR) 10 MG tablet Take 1 tablet (10 mg total) by mouth daily.    Allergies: Ciprofloxacin, Tylenol [acetaminophen], Celecoxib, and Zoloft [sertraline hcl]  Social History   Tobacco Use   Smoking status: Never    Passive exposure: Current   Smokeless tobacco: Never  Vaping Use   Vaping Use: Never used  Substance Use Topics   Alcohol use: Not Currently   Drug use: Yes    Frequency: 2.5 times per week    Types: Marijuana    Comment: occasional    Family History  Problem Relation Age of Onset   Aneurysm Mother    Cancer Father    Lung cancer Father    Mental illness Sister    Mental illness Sister    Brain cancer Maternal Grandmother    Emphysema Maternal Grandfather    Drug abuse Maternal Uncle    Drug abuse Paternal Aunt     Review of Systems: A 12-system review of systems was performed and was negative except as noted in the HPI.  --------------------------------------------------------------------------------------------------  Physical Exam: BP 130/70 (BP Location: Left Arm, Patient Position: Sitting, Cuff Size: Large)   Pulse 65   Wt (!) 340 lb 6.4 oz (154.4 kg)   SpO2 96%   BMI 44.91 kg/m   General:  NAD. Neck: No JVD or HJR. Lungs: Clear to auscultation bilaterally without wheezes or crackles. Heart: Regular rate and rhythm without murmurs, rubs, or gallops. Abdomen: Soft, nontender, nondistended. Extremities: No lower extremity edema.  EKG: Normal sinus rhythm with left axis deviation.  Otherwise, no significant abnormalities.  Compared with prior tracing from 07/17/2021, PVCs are no longer present.  Lab Results  Component Value Date   WBC 14.9 (H) 04/08/2022   HGB 15.7 04/08/2022   HCT 45.7 04/08/2022   MCV 85.6 04/08/2022   PLT 180 04/08/2022    Lab Results  Component Value Date   NA 137 04/08/2022   K 4.0 04/08/2022   CL 104  04/08/2022   CO2 19 (L) 04/08/2022   BUN 21 (H) 04/08/2022   CREATININE 0.80 04/08/2022   GLUCOSE 121 (H) 04/08/2022   ALT 23 04/08/2022    Lab Results  Component Value Date   CHOL 171 03/25/2022   HDL 44 03/25/2022   LDLCALC 92 03/25/2022   TRIG 205 (H) 03/25/2022   CHOLHDL 3.9 03/25/2022    --------------------------------------------------------------------------------------------------  ASSESSMENT AND PLAN: Chronic diastolic heart failure and dyspnea on exertion: Overall, Mr. Richesin is feeling fairly well though he has some exertional dyspnea albeit improved compared to the time of his last echo in 2021.  Diastolic dysfunction was reported at that time, though images are not available for review and further grading of diastolic dysfunction was not included.  We have agreed to repeat an echocardiogram for reevaluation given that he has some persistent symptoms and may need further surgery in the near future (bariatric and/or spine surgery).  We discussed importance of continued weight loss and CPAP use.  Based on results of echocardiogram and symptoms, we will also need to consider ischemia evaluation.  Given Carter's morbid obesity and limited  functional capacity on account of his back and leg issues, I would favor myocardial PET/CT.  Palpitations: Episodes are fairly brief and sporadic, though certainly Mr. Eckardt is at risk for atrial arrhythmias in the setting of his morbid obesity and diastolic dysfunction.  We will repeat an echocardiogram, as above.  If symptoms escalate, ambulatory cardiac monitoring will need to be considered.  Hyperlipidemia: LDL reasonable in the absence of established ASCVD on last check in February.  New rosuvastatin at the direction of his PCP.  Morbid obesity: BMI remains greater than 40 with multiple comorbidities including HFpEF, sleep apnea and hyperlipidemia.  Weight loss encouraged.  Follow-up: Return to clinic in 3 months.  Yvonne Kendall,  MD 05/30/2022 4:32 PM

## 2022-06-01 ENCOUNTER — Encounter: Payer: Self-pay | Admitting: Internal Medicine

## 2022-06-01 DIAGNOSIS — R002 Palpitations: Secondary | ICD-10-CM | POA: Insufficient documentation

## 2022-06-01 DIAGNOSIS — R0609 Other forms of dyspnea: Secondary | ICD-10-CM | POA: Insufficient documentation

## 2022-06-02 ENCOUNTER — Other Ambulatory Visit: Payer: Self-pay | Admitting: Family Medicine

## 2022-06-02 ENCOUNTER — Ambulatory Visit (INDEPENDENT_AMBULATORY_CARE_PROVIDER_SITE_OTHER): Payer: 59 | Admitting: Orthopedic Surgery

## 2022-06-02 DIAGNOSIS — M5442 Lumbago with sciatica, left side: Secondary | ICD-10-CM | POA: Diagnosis not present

## 2022-06-02 DIAGNOSIS — M5116 Intervertebral disc disorders with radiculopathy, lumbar region: Secondary | ICD-10-CM

## 2022-06-02 DIAGNOSIS — G8929 Other chronic pain: Secondary | ICD-10-CM

## 2022-06-02 DIAGNOSIS — M5441 Lumbago with sciatica, right side: Secondary | ICD-10-CM | POA: Diagnosis not present

## 2022-06-02 MED ORDER — OXYCODONE HCL 5 MG PO TABS: 5.0000 mg | ORAL_TABLET | ORAL | 0 refills | Status: DC | PRN

## 2022-06-02 NOTE — Telephone Encounter (Signed)
Pts wife is calling to report that patient has one pill left. Pt received dx today of spinal stenosis at L1/2 that may be causing his groin pain  Preferred Pharmacy- [facility]  Medication Refill - Medication: oxyCODONE (OXY IR/ROXICODONE) 5 MG immediate release tablet [[id number]]   Has the patient contacted their pharmacy? Yes.   (Agent: If no, request that the patient contact the pharmacy for the refill. If patient does not wish to contact the pharmacy document the reason why and proceed with request.) (Agent: If yes, when and what did the pharmacy advise?)  Preferred Pharmacy (with phone number or street name):  [id number] [place], [place] - [address] AT [facility] OF [address] [place], [place] [address] Phone: [phone]  Fax: [phone]   Has the patient been seen for an appointment in the last year OR does the patient have an upcoming appointment? Yes.    Agent: Please be advised that RX refills may take up to 3 business days. We ask that you follow-up with your pharmacy.

## 2022-06-02 NOTE — Progress Notes (Signed)
Orthopedic Spine Surgery Office Note   Assessment: Patient is a 62 y.o. male with low back pain that radiates into his groin. Has disc herniation at stenosis at L1/2 that may be causing his groin pain     Plan: -Patient has tried physical therapy, NSAIDs, lyrica, narcotics -Recommended diagnostic and therapeutic injection at L1/2 -Referral provided to pain management -Reiterated the fact that he will need to lose weight in order to be a surgical candidate. Gave him a weight goal of 300lbs -Patient should return to office in 6 weeks, x-rays at next visit: none     Patient expressed understanding of the plan and all questions were answered to the patient's satisfaction.    ___________________________________________________________________________     History:   Patient is a 62 y.o. male who presents today for lumbar spine.  Patient has been previously seen in the office for chronic low back pain.  He eventually developed symptoms of pain radiating into the groin.  It does not radiate past the groin on either side.  He feels it around the perineum.  He feels the pain on a daily basis.  He has been getting narcotics to help with the pain but did not completely relieve it.  Denies paresthesias and numbness.  No bowel or bladder incontinence.    Treatments tried: PT, NSAIDs, Lyrica, narcotics   Physical Exam:   General: no acute distress, appears stated age Neurologic: alert, answering questions appropriately, following commands Respiratory: unlabored breathing on room air, symmetric chest rise Psychiatric: appropriate affect, normal cadence to speech     MSK (spine):   -Strength exam                                                   Left                  Right EHL                              5/5                  5/5 TA                                 5/5                  5/5 GSC                             5/5                  5/5 Knee extension            5/5                   5/5 Hip flexion                    5/5                  5/5   -Sensory exam                           Sensation intact to  light touch in L3-S1 nerve distributions of bilateral lower extremities   -Achilles DTR: 1/4 on the left, 1/4 on the right -Patellar tendon DTR: 1/4 on the left, 1/4 on the right   -Straight leg raise: negative bilaterally -Femoral nerve stretch test: negative bilaterally  -Clonus: no beats bilaterally   -Left hip exam: negative stinchfield, no pain through range of motion, negative FABER -Right hip exam: negative stinchfield, no pain through range of motion, negative FABER   Imaging: XR of the lumbar spine from 04/24/2022 was independently reviewed and interpreted, showing no significant degenerative changes. No fracture or dislocation. No evidence of instability on flexion/extension views.    CT of chest/abdomen/pelvis from 04/08/2022 showed a calcified disc herniation at L1/2.   MRI of the lumbar spine from 05/12/2022 was independently reviewed and interpreted, showing central disc herniation at L1/2 with central stenosis at that level.  Lateral recess stenosis at L2/3 and at L4/5.  Foraminal stenosis bilaterally at L4/5 and on the left at L5/S1     Patient name: [name] [name] Patient MRN: [id number] Date of visit: 06/02/22

## 2022-06-03 DIAGNOSIS — G4733 Obstructive sleep apnea (adult) (pediatric): Secondary | ICD-10-CM | POA: Diagnosis not present

## 2022-06-03 MED ORDER — OXYCODONE HCL 5 MG PO TABS: 5.0000 mg | ORAL_TABLET | ORAL | 0 refills | Status: DC | PRN

## 2022-06-03 NOTE — Telephone Encounter (Signed)
Requested medication (s) are due for refill today - no  Requested medication (s) are on the active medication list -yes  Future visit scheduled -no  Last refill: 06/02/22 #30  Notes to clinic: non delegated Rx  Requested Prescriptions  Pending Prescriptions Disp Refills   oxyCODONE (OXY IR/ROXICODONE) 5 MG immediate release tablet 30 tablet 0    Sig: Take 1 tablet (5 mg total) by mouth every 4 (four) hours as needed for severe pain.     Not Delegated - Analgesics:  Opioid Agonists Failed - 06/02/2022  2:59 PM      Failed - This refill cannot be delegated      Failed - Urine Drug Screen completed in last 360 days      Passed - Valid encounter within last 3 months    Recent Outpatient Visits           4 weeks ago Lumbar disc disease with radiculopathy   [facility] Merita Norton T, FNP   2 months ago Chronic bilateral low back pain with bilateral sciatica   [facility] Jacky Kindle, FNP   6 months ago Morbid obesity [facility])   [facility] Merita Norton T, FNP   7 months ago Acute prostatitis   [facility] [place] Dense M, DO   7 months ago Depression, recurrent [facility])   [facility] Merita Norton T, FNP       Future Appointments             In 1 month London Sheer, MD [facility]   In 3 months [place], Lavonna Rua, NP [facility]               Requested Prescriptions  Pending Prescriptions Disp Refills   oxyCODONE (OXY IR/ROXICODONE) 5 MG immediate release tablet 30 tablet 0    Sig: Take 1 tablet (5 mg total) by mouth every 4 (four) hours as needed for severe pain.     Not Delegated - Analgesics:  Opioid Agonists Failed - 06/02/2022  2:59 PM      Failed - This refill cannot be delegated      Failed - Urine Drug Screen completed in last 360 days      Passed - Valid encounter within last 3  months    Recent Outpatient Visits           4 weeks ago Lumbar disc disease with radiculopathy   [facility] Mer[facility] Norton T, FNP   2 months ago Chronic bilateral low back pain with bilateral sciatica   [facility] Jacky Kindle, FNP   6 months ago Morbid obesity [facility])   [facility] Merita Norton T, FNP   7 months ago Acute prostatitis   [facility] [place] Dense M, DO   7 months ago Depression, recurrent [facility])   [facility] Jacky Kindle, FNP       Future Appointments             In 1 month Christell Constant Tyrone Apple, MD [facility]   In 3 months Charlsie Quest, NP [facility]

## 2022-06-04 ENCOUNTER — Encounter: Payer: Self-pay | Admitting: Family Medicine

## 2022-06-06 NOTE — Telephone Encounter (Signed)
I spoke with [name] [name] by phone regarding natural history of diastolic dysfunction and its significant clinical variability.  As there was no grading of his diastolic dysfunction on the outside echo report from 2021, it is difficult to know if mildly impaired relaxation was noted (which would be a typical finding for a man in his 20s with morbid obesity) versus more significant diastolic dysfunction or restrictive cardiomyopathy.  He is scheduled for echocardiogram next month, which will allow [place] to provide more insight into the degree of his diastolic dysfunction and potential therapeutic options.  Given that he has stable exertional dyspnea that has not progressed significantly since our prior visit, I do not think that he has advanced diastolic heart failure.  Will plan to follow-up after his echocardiogram next month.  Yvonne Kendall, MD [facility]

## 2022-06-10 ENCOUNTER — Encounter: Payer: Self-pay | Admitting: Psychiatry

## 2022-06-10 ENCOUNTER — Ambulatory Visit (INDEPENDENT_AMBULATORY_CARE_PROVIDER_SITE_OTHER): Payer: 59 | Admitting: Psychiatry

## 2022-06-10 VITALS — BP 129/74 | HR 72 | Temp 97.5°F | Ht 73.0 in | Wt 340.2 lb

## 2022-06-10 DIAGNOSIS — F331 Major depressive disorder, recurrent, moderate: Secondary | ICD-10-CM

## 2022-06-10 DIAGNOSIS — F4310 Post-traumatic stress disorder, unspecified: Secondary | ICD-10-CM | POA: Diagnosis not present

## 2022-06-10 MED ORDER — HYDROXYZINE PAMOATE 25 MG PO CAPS: 25.0000 mg | ORAL_CAPSULE | Freq: Two times a day (BID) | ORAL | 1 refills | Status: DC | PRN

## 2022-06-10 MED ORDER — DULOXETINE HCL 30 MG PO CPEP: 30.0000 mg | ORAL_CAPSULE | Freq: Every day | ORAL | 1 refills | Status: DC
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
## 2022-06-10 NOTE — Patient Instructions (Signed)
[email or web address]  [email or web address]  [employer] [email or web address] [address], [place], [place] [address]  [phone]  [facility], [facility] [email or web address] [address] [place], [place] [address]   [phone]   [facility] - [phone]  [facility] - [phone]  [facility] - [phone] counseling [phone]  [facility] [phone]   [email or web address] this clinician can offer telehealth and has a sliding scale option  [email or web address] this group also offers sliding scale rates and is based out of [place]  Dr. Liborio Nixon with the [facility] specializes in divorce  [employer] [facility] has interns who offer sliding scale rates and some of the full time clinicians do, as well. You complete their contact form on their website and the referrals coordinator will help to get connected to someone   Hydroxyzine Capsules or Tablets What is this medication? HYDROXYZINE (hye DROX i zeen) treats the symptoms of allergies and allergic reactions. It may also be used to treat anxiety or cause drowsiness before a procedure. It works by blocking histamine, a substance released by the body during an allergic reaction. It belongs to a group of medications called antihistamines. This medicine may be used for other purposes; ask your health care provider or pharmacist if you have questions. COMMON BRAND NAME(S): ANX, Atarax, Rezine, Vistaril What should I tell my care team before I take this medication? They need to know if you have any of these conditions: Glaucoma Heart disease Irregular heartbeat or rhythm Kidney disease Liver disease Lung or breathing disease, such as asthma Stomach or intestine problems Thyroid disease Trouble passing urine An unusual  or allergic reaction to hydroxyzine, other medications, foods, dyes or preservatives Pregnant or trying to get pregnant Breastfeeding How should I use this medication? Take this medication by mouth with a full glass of water. Take it as directed on the prescription label at the same time every day. You can take it with or without food. If it upsets your stomach, take it with food. Talk to your care team about the use of this medication in children. While it may be prescribed for children as young as 6 years for selected conditions, precautions do apply. People 65 years and older may have a stronger reaction and need a smaller dose. Overdosage: If you think you have taken too much of this medicine contact a poison control center or emergency room at once. NOTE: This medicine is only for you. Do not share this medicine with others. What if I miss a dose? If you miss a dose, take it as soon as you can. If it is almost time for your next dose, take only that dose. Do not take double or extra doses. What may interact with this medication? Do not take this medication with any of the following: Cisapride Dronedarone Pimozide Thioridazine This medication may also interact with the following: Alcohol Antihistamines for allergy, cough, and cold Atropine Barbiturate medications for sleep or seizures, such as phenobarbital Certain antibiotics, such as erythromycin or clarithromycin Certain medications for anxiety or sleep Certain medications for bladder problems, such as oxybutynin or tolterodine Certain medications for irregular heartbeat Certain medications for mental health conditions Certain medications for Parkinson disease, such as benztropine, trihexyphenidyl Certain medications for seizures, such as phenobarbital or primidone Certain medications for stomach problems, such as dicyclomine or hyoscyamine Certain medications for travel sickness, such as scopolamine Ipratropium Opioid  medications  for pain Other medications that cause heart rhythm changes, such as dofetilide This list may not describe all possible interactions. Give your health care provider a list of all the medicines, herbs, non-prescription drugs, or dietary supplements you use. Also tell them if you smoke, drink alcohol, or use illegal drugs. Some items may interact with your medicine. What should I watch for while using this medication? Visit your care team for regular checks on your progress. Tell your care team if your symptoms do not start to get better or if they get worse. This medication may affect your coordination, reaction time, or judgment. Do not drive or operate machinery until you know how this medication affects you. Sit up or stand slowly to reduce the risk of dizzy or fainting spells. Drinking alcohol with this medication can increase the risk of these side effects. Your mouth may get dry. Chewing sugarless gum or sucking hard candy and drinking plenty of water may help. Contact your care team if the problem does not go away or is severe. This medication may cause dry eyes and blurred vision. If you wear contact lenses, you may feel some discomfort. Lubricating eye drops may help. See your care team if the problem does not go away or is severe. If you are receiving skin tests for allergies, tell your care team you are taking this medication. What side effects may I notice from receiving this medication? Side effects that you should report to your care team as soon as possible: Allergic reactions--skin rash, itching, hives, swelling of the face, lips, tongue, or throat Heart rhythm changes--fast or irregular heartbeat, dizziness, feeling faint or lightheaded, chest pain, trouble breathing Side effects that usually do not require medical attention (report to your care team if they continue or are bothersome): Confusion Drowsiness Dry mouth Hallucinations Headache This list may not describe all  possible side effects. Call your doctor for medical advice about side effects. You may report side effects to FDA at 1-800-FDA-1088. Where should I keep my medication? Keep out of the reach of children and pets. Store at room temperature between 15 and 30 degrees C (59 and 86 degrees F). Keep container tightly closed. Throw away any unused medication after the expiration date. NOTE: This sheet is a summary. It may not cover all possible information. If you have questions about this medicine, talk to your doctor, pharmacist, or health care provider.  2023 Elsevier/Gold Standard (2004-04-15 00:00:00) Duloxetine Delayed-Release Capsules What is this medication? DULOXETINE (doo LOX e teen) treats depression, anxiety, fibromyalgia, and certain types of chronic pain such as nerve, bone, or joint pain. It increases the amount of serotonin and norepinephrine in the brain, hormones that help regulate mood and pain. It belongs to a group of medications called SNRIs. This medicine may be used for other purposes; ask your health care provider or pharmacist if you have questions. COMMON BRAND NAME(S): Cymbalta, Drizalma, Irenka What should I tell my care team before I take this medication? They need to know if you have any of these conditions: Bipolar disorder Glaucoma High blood pressure Kidney disease Liver disease Seizures Suicidal thoughts, plans or attempt; a previous suicide attempt by you or a family member Take medications that treat or prevent blood clots Taken medications called MAOIs like Carbex, Eldepryl, Marplan, Nardil, and Parnate within 14 days Trouble passing urine An unusual reaction to duloxetine, other medications, foods, dyes, or preservatives Pregnant or trying to get pregnant Breast-feeding How should I use this medication? Take this medication  by mouth with a glass of water. Follow the directions on the prescription label. Do not crush, cut or chew some capsules of this  medication. Some capsules may be opened and sprinkled on applesauce. Check with your care team or pharmacist if you are not sure. You can take this medication with or without food. Take your medication at regular intervals. Do not take your medication more often than directed. Do not stop taking this medication suddenly except upon the advice of your care team. Stopping this medication too quickly may cause serious side effects or your condition may worsen. A special MedGuide will be given to you by the pharmacist with each prescription and refill. Be sure to read this information carefully each time. Talk to your care team regarding the use of this medication in children. While this medication may be prescribed for children as young as 50 years of age for selected conditions, precautions do apply. Overdosage: If you think you have taken too much of this medicine contact a poison control center or emergency room at once. NOTE: This medicine is only for you. Do not share this medicine with others. What if I miss a dose? If you miss a dose, take it as soon as you can. If it is almost time for your next dose, take only that dose. Do not take double or extra doses. What may interact with this medication? Do not take this medication with any of the following: Desvenlafaxine Levomilnacipran Linezolid MAOIs like Carbex, Eldepryl, Emsam, Marplan, Nardil, and Parnate Methylene blue (injected into a vein) Milnacipran Safinamide Thioridazine Venlafaxine Viloxazine This medication may also interact with the following: Alcohol Amphetamines Aspirin and aspirin-like medications Certain antibiotics like ciprofloxacin and enoxacin Certain medications for blood pressure, heart disease, irregular heart beat Certain medications for depression, anxiety, or psychotic disturbances Certain medications for migraine headache like almotriptan, eletriptan, frovatriptan, naratriptan, rizatriptan, sumatriptan,  zolmitriptan Certain medications that treat or prevent blood clots like warfarin, enoxaparin, and dalteparin Cimetidine Fentanyl Lithium NSAIDS, medications for pain and inflammation, like ibuprofen or naproxen Phentermine Procarbazine Rasagiline Sibutramine St. John's wort Theophylline Tramadol Tryptophan This list may not describe all possible interactions. Give your health care provider a list of all the medicines, herbs, non-prescription drugs, or dietary supplements you use. Also tell them if you smoke, drink alcohol, or use illegal drugs. Some items may interact with your medicine. What should I watch for while using this medication? Tell your care team if your symptoms do not get better or if they get worse. Visit your care team for regular checks on your progress. Because it may take several weeks to see the full effects of this medication, it is important to continue your treatment as prescribed by your care team. This medication may cause serious skin reactions. They can happen weeks to months after starting the medication. Contact your care team right away if you notice fevers or flu-like symptoms with a rash. The rash may be red or purple and then turn into blisters or peeling of the skin. Or, you might notice a red rash with swelling of the face, lips, or lymph nodes in your neck or under your arms. Watch for new or worsening thoughts of suicide or depression. This includes sudden changes in mood, behaviors, or thoughts. These changes can happen at any time but are more common in the beginning of treatment or after a change in dose. Call your care team right away if you experience these thoughts or worsening depression. Manic episodes may  happen in patients with bipolar disorder who take this medication. Watch for changes in feelings or behaviors such as feeling anxious, nervous, agitated, panicky, irritable, hostile, aggressive, impulsive, severely restless, overly excited and  hyperactive, or trouble sleeping. These symptoms can happen at any time, but are more common in the beginning of treatment or after a change in dose. Call your care team right away if you notice any of these symptoms. You may get drowsy or dizzy. Do not drive, use machinery, or do anything that needs mental alertness until you know how this medication affects you. Do not stand or sit up quickly, especially if you are an older patient. This reduces the risk of dizzy or fainting spells. Alcohol may interfere with the effect of this medication. Avoid alcoholic drinks. This medication may increase blood sugar. The risk may be higher in patients who already have diabetes. Ask your care team what you can do to lower your risk of diabetes while taking this medication. This medication can cause an increase in blood pressure. This medication can also cause a sudden drop in your blood pressure, which may make you feel faint and increase the chance of a fall. These effects are most common when you first start the medication or when the dose is increased, or during use of other medications that can cause a sudden drop in blood pressure. Check with your care team for instructions on monitoring your blood pressure while taking this medication. Your mouth may get dry. Chewing sugarless gum or sucking hard candy, and drinking plenty of water, may help. Contact your care team if the problem does not go away or is severe. What side effects may I notice from receiving this medication? Side effects that you should report to your care team as soon as possible: Allergic reactions--skin rash, itching, hives, swelling of the face, lips, tongue, or throat Bleeding--bloody or black, tar-like stools, red or dark brown urine, vomiting blood or brown material that looks like coffee grounds, small, red or purple spots on skin, unusual bleeding or bruising Increase in blood pressure Liver injury--right upper belly pain, loss of  appetite, nausea, light-colored stool, dark yellow or brown urine, yellowing skin or eyes, unusual weakness or fatigue Low sodium level--muscle weakness, fatigue, dizziness, headache, confusion Redness, blistering, peeling, or loosening of the skin, including inside the mouth Serotonin syndrome--irritability, confusion, fast or irregular heartbeat, muscle stiffness, twitching muscles, sweating, high fever, seizures, chills, vomiting, diarrhea Sudden eye pain or change in vision such as blurry vision, seeing halos around lights, vision loss Thoughts of suicide or self-harm, worsening mood, feelings of depression Trouble passing urine Side effects that usually do not require medical attention (report to your care team if they continue or are bothersome): Change in sex drive or performance Constipation Diarrhea Dizziness Dry mouth Excessive sweating Loss of appetite Nausea Vomiting This list may not describe all possible side effects. Call your doctor for medical advice about side effects. You may report side effects to FDA at 1-800-FDA-1088. Where should I keep my medication? Keep out of the reach of children and pets. Store at room temperature between 15 and 30 degrees C (59 to 86 degrees F). Get rid of any unused medication after the expiration date. To get rid of medications that are no longer needed or have expired: Take the medication to a medication take-back program. Check with your pharmacy or law enforcement to find a location. If you cannot return the medication, check the label or package insert to see  if the medication should be thrown out in the garbage or flushed down the toilet. If you are not sure, ask your care team. If it is safe to put it in the trash, take the medication out of the container. Mix the medication with cat litter, dirt, coffee grounds, or other unwanted substance. Seal the mixture in a bag or container. Put it in the trash. NOTE: This sheet is a summary. It may  not cover all possible information. If you have questions about this medicine, talk to your doctor, pharmacist, or health care provider.  2023 Elsevier/Gold Standard (2020-01-23 00:00:00)

## 2022-06-12 DIAGNOSIS — Z79899 Other long term (current) drug therapy: Secondary | ICD-10-CM | POA: Diagnosis not present

## 2022-06-12 DIAGNOSIS — M5440 Lumbago with sciatica, unspecified side: Secondary | ICD-10-CM | POA: Diagnosis not present

## 2022-06-12 DIAGNOSIS — E559 Vitamin D deficiency, unspecified: Secondary | ICD-10-CM | POA: Diagnosis not present

## 2022-06-12 DIAGNOSIS — G8929 Other chronic pain: Secondary | ICD-10-CM | POA: Diagnosis not present

## 2022-06-12 DIAGNOSIS — M129 Arthropathy, unspecified: Secondary | ICD-10-CM | POA: Diagnosis not present

## 2022-06-16 ENCOUNTER — Other Ambulatory Visit: Payer: Self-pay

## 2022-06-16 ENCOUNTER — Ambulatory Visit (INDEPENDENT_AMBULATORY_CARE_PROVIDER_SITE_OTHER): Payer: 59 | Admitting: Physical Medicine and Rehabilitation

## 2022-06-16 VITALS — BP 137/80 | HR 60

## 2022-06-16 DIAGNOSIS — Z79899 Other long term (current) drug therapy: Secondary | ICD-10-CM | POA: Diagnosis not present

## 2022-06-16 DIAGNOSIS — M5416 Radiculopathy, lumbar region: Secondary | ICD-10-CM | POA: Diagnosis not present

## 2022-06-16 MED ORDER — METHYLPREDNISOLONE ACETATE 80 MG/ML IJ SUSP
80.0000 mg | Freq: Once | INTRAMUSCULAR | Status: AC
  Administered 2022-06-16: 80 mg

## 2022-06-16 NOTE — Patient Instructions (Signed)

## 2022-06-16 NOTE — Progress Notes (Unsigned)
Functional Pain Scale - descriptive words and definitions  Distracting (5)    Aware of pain/able to complete some ADL's but limited by pain/sleep is affected and active distractions are only slightly useful. Moderate range order  Average Pain  varies   +Driver, -BT, -Dye Allergies.  Lower back pain that can radiate into the legs

## 2022-06-17 NOTE — Progress Notes (Signed)
[name] [name] - 62 y.o. male MRN [id number]  Date of birth: 112/08/1960  Office Visit Note: Visit Date: 06/16/2022 PCP: Jacky Kindle, FNP Referred by: London Sheer, MD  Subjective: Chief Complaint  Patient presents with   Lower Back - Pain   HPI:  [name] [name] is a 62 y.o. male who comes in today at the request of Dr. Willia Craze for planned Right L1-2 Lumbar Interlaminar epidural steroid injection with fluoroscopic guidance.  The patient has failed conservative care including home exercise, medications, time and activity modification.  This injection will be diagnostic and hopefully therapeutic.  Please see requesting physician notes for further details and justification.   ROS Otherwise per HPI.  Assessment & Plan: Visit Diagnoses:    ICD-10-CM   1. Lumbar radiculopathy  M54.16 XR C-ARM NO REPORT    Epidural Steroid injection    methylPREDNISolone acetate (DEPO-MEDROL) injection 80 mg      Plan: No additional findings.   Meds & Orders:  Meds ordered this encounter  Medications   methylPREDNISolone acetate (DEPO-MEDROL) injection 80 mg    Orders Placed This Encounter  Procedures   XR C-ARM NO REPORT   Epidural Steroid injection    Follow-up: Return for visit to requesting provider as needed.   Procedures: No procedures performed  Lumbar Epidural Steroid Injection - Interlaminar Approach with Fluoroscopic Guidance  Patient: [name] [name]      Date of Birth: 112/08/1960 MRN: [id number] PCP: Jacky Kindle, FNP      Visit Date: 06/16/2022   Universal Protocol:     Consent Given By: the patient  Position: PRONE  Additional Comments: Vital signs were monitored before and after the procedure. Patient was prepped and draped in the usual sterile fashion. The correct patient, procedure, and site was verified.   Injection Procedure Details:   Procedure diagnoses: Lumbar radiculopathy [M54.16]   Meds Administered:  Meds ordered this encounter  Medications    methylPREDNISolone acetate (DEPO-MEDROL) injection 80 mg     Laterality: Right  Location/Site:  L1-2  Needle: 4.5 in., 20 ga. Tuohy  Needle Placement: Paramedian epidural  Findings:   -Comments: Excellent flow of contrast into the epidural space.  Procedure Details: Using a paramedian approach from the side mentioned above, the region overlying the inferior lamina was localized under fluoroscopic visualization and the soft tissues overlying this structure were infiltrated with 4 ml. of 1% Lidocaine without Epinephrine. The Tuohy needle was inserted into the epidural space using a paramedian approach.   The epidural space was localized using loss of resistance along with counter oblique bi-planar fluoroscopic views.  After negative aspirate for air, blood, and CSF, a 2 ml. volume of Isovue-250 was injected into the epidural space and the flow of contrast was observed. Radiographs were obtained for documentation purposes.    The injectate was administered into the level noted above.   Additional Comments:  No complications occurred Dressing: 2 x 2 sterile gauze and Band-Aid    Post-procedure details: Patient was observed during the procedure. Post-procedure instructions were reviewed.  Patient left the clinic in stable condition.   Clinical History: MRI LUMBAR SPINE WITHOUT CONTRAST   TECHNIQUE: Multiplanar, multisequence MR imaging of the lumbar spine was performed. No intravenous contrast was administered.   COMPARISON:  11/08/2019   FINDINGS: Segmentation:  Standard.   Alignment:  Physiologic.   Vertebrae: No acute fracture, evidence of discitis, or aggressive bone lesion.   Conus medullaris and cauda equina: Conus extends to the L1-2  level. Conus and cauda equina appear normal.   Paraspinal and other soft tissues: No acute paraspinal abnormality.   Disc levels:   Disc spaces: Degenerative disease with disc height loss at T12-L1, L1-2. Disc desiccation at  L2-3, L3-4, L4-5 and L5-S1   T12-L1: Broad-based disc bulge with a small central disc protrusion. No foraminal or central canal stenosis.   L1-L2: Broad-based disc bulge with a broad central disc protrusion. Mild spinal stenosis. No foraminal stenosis. Mild bilateral facet arthropathy. No foraminal stenosis.   L2-L3: Broad-based disc bulge. Mild bilateral facet arthropathy. Mild spinal stenosis. Bilateral lateral recess stenosis. Bilateral mild foraminal stenosis.   L3-L4: Broad-based disc bulge. Moderate bilateral facet arthropathy. Bilateral lateral recess stenosis. Mild bilateral foraminal stenosis, right greater than left. No spinal stenosis.   L4-L5: Broad-based disc bulge. Moderate bilateral facet arthropathy. Bilateral subarticular recess stenosis. Severe left and moderate right foraminal stenosis. Mild spinal stenosis.   L5-S1: Mild broad-based disc bulge. Moderate bilateral facet arthropathy. No right foraminal stenosis. Moderate left foraminal stenosis. No spinal stenosis.   IMPRESSION: 1. Diffuse lumbar spine spondylosis as described above. 2. No acute osseous injury of the lumbar spine.     Electronically Signed   By: Elige Ko M.D.   On: 05/14/2022 13:07     Objective:  VS:  HT:    WT:   BMI:     BP:137/80  HR:60bpm  TEMP: ( )  RESP:  Physical Exam Vitals and nursing note reviewed.  Constitutional:      General: He is not in acute distress.    Appearance: Normal appearance. He is obese. He is not ill-appearing.  HENT:     Head: Normocephalic and atraumatic.     Right Ear: External ear normal.     Left Ear: External ear normal.     Nose: No congestion.  Eyes:     Extraocular Movements: Extraocular movements intact.  Cardiovascular:     Rate and Rhythm: Normal rate.     Pulses: Normal pulses.  Pulmonary:     Effort: Pulmonary effort is normal. No respiratory distress.  Abdominal:     General: There is no distension.     Palpations: Abdomen  is soft.  Musculoskeletal:        General: No tenderness or signs of injury.     Cervical back: Neck supple.     Right lower leg: No edema.     Left lower leg: No edema.     Comments: Patient has good distal strength without clonus.  Skin:    Findings: No erythema or rash.  Neurological:     General: No focal deficit present.     Mental Status: He is alert and oriented to person, place, and time.     Sensory: No sensory deficit.     Motor: No weakness or abnormal muscle tone.     Coordination: Coordination normal.  Psychiatric:        Mood and Affect: Mood normal.        Behavior: Behavior normal.      Imaging: XR C-ARM NO REPORT  Result Date: 06/16/2022 Please see Notes tab for imaging impression.

## 2022-06-17 NOTE — Procedures (Signed)
Lumbar Epidural Steroid Injection - Interlaminar Approach with Fluoroscopic Guidance  Patient: [name] [name]      Date of Birth: 10-02-1960 MRN: [id number] PCP: Jacky Kindle, FNP      Visit Date: 06/16/2022   Universal Protocol:     Consent Given By: the patient  Position: PRONE  Additional Comments: Vital signs were monitored before and after the procedure. Patient was prepped and draped in the usual sterile fashion. The correct patient, procedure, and site was verified.   Injection Procedure Details:   Procedure diagnoses: Lumbar radiculopathy [M54.16]   Meds Administered:  Meds ordered this encounter  Medications   methylPREDNISolone acetate (DEPO-MEDROL) injection 80 mg     Laterality: Right  Location/Site:  L1-2  Needle: 4.5 in., 20 ga. Tuohy  Needle Placement: Paramedian epidural  Findings:   -Comments: Excellent flow of contrast into the epidural space.  Procedure Details: Using a paramedian approach from the side mentioned above, the region overlying the inferior lamina was localized under fluoroscopic visualization and the soft tissues overlying this structure were infiltrated with 4 ml. of 1% Lidocaine without Epinephrine. The Tuohy needle was inserted into the epidural space using a paramedian approach.   The epidural space was localized using loss of resistance along with counter oblique bi-planar fluoroscopic views.  After negative aspirate for air, blood, and CSF, a 2 ml. volume of Isovue-250 was injected into the epidural space and the flow of contrast was observed. Radiographs were obtained for documentation purposes.    The injectate was administered into the level noted above.   Additional Comments:  No complications occurred Dressing: 2 x 2 sterile gauze and Band-Aid    Post-procedure details: Patient was observed during the procedure. Post-procedure instructions were reviewed.  Patient left the clinic in stable condition.

## 2022-06-26 DIAGNOSIS — M5440 Lumbago with sciatica, unspecified side: Secondary | ICD-10-CM | POA: Diagnosis not present

## 2022-06-26 DIAGNOSIS — G8929 Other chronic pain: Secondary | ICD-10-CM | POA: Diagnosis not present

## 2022-06-26 DIAGNOSIS — Z79899 Other long term (current) drug therapy: Secondary | ICD-10-CM | POA: Diagnosis not present

## 2022-06-30 DIAGNOSIS — Z79899 Other long term (current) drug therapy: Secondary | ICD-10-CM | POA: Diagnosis not present

## 2022-07-04 ENCOUNTER — Ambulatory Visit: Payer: 59 | Attending: Internal Medicine

## 2022-07-04 DIAGNOSIS — R0609 Other forms of dyspnea: Secondary | ICD-10-CM

## 2022-07-04 DIAGNOSIS — I5032 Chronic diastolic (congestive) heart failure: Secondary | ICD-10-CM | POA: Diagnosis not present

## 2022-07-05 LAB — ECHOCARDIOGRAM COMPLETE
AR max vel: 3.11 cm2
AV Area VTI: 3.63 cm2
AV Area mean vel: 3.3 cm2
AV Mean grad: 6.5 mmHg
AV Peak grad: 12.3 mmHg
Ao pk vel: 1.75 m/s
Area-P 1/2: 3.88 cm2
Calc EF: 56.3 %
S' Lateral: 3.7 cm
Single Plane A2C EF: 54.9 %
Single Plane A4C EF: 57.2 %

## 2022-07-07 DIAGNOSIS — H524 Presbyopia: Secondary | ICD-10-CM | POA: Diagnosis not present

## 2022-07-16 ENCOUNTER — Ambulatory Visit: Payer: 59 | Admitting: Orthopedic Surgery

## 2022-07-17 ENCOUNTER — Encounter: Payer: Self-pay | Admitting: Psychiatry

## 2022-07-17 ENCOUNTER — Telehealth (INDEPENDENT_AMBULATORY_CARE_PROVIDER_SITE_OTHER): Payer: 59 | Admitting: Psychiatry

## 2022-07-17 DIAGNOSIS — F331 Major depressive disorder, recurrent, moderate: Secondary | ICD-10-CM | POA: Diagnosis not present

## 2022-07-17 DIAGNOSIS — M4800 Spinal stenosis, site unspecified: Secondary | ICD-10-CM | POA: Diagnosis not present

## 2022-07-17 DIAGNOSIS — K219 Gastro-esophageal reflux disease without esophagitis: Secondary | ICD-10-CM | POA: Diagnosis not present

## 2022-07-17 DIAGNOSIS — G4733 Obstructive sleep apnea (adult) (pediatric): Secondary | ICD-10-CM | POA: Diagnosis not present

## 2022-07-17 DIAGNOSIS — F4310 Post-traumatic stress disorder, unspecified: Secondary | ICD-10-CM

## 2022-07-17 DIAGNOSIS — E785 Hyperlipidemia, unspecified: Secondary | ICD-10-CM | POA: Diagnosis not present

## 2022-07-17 DIAGNOSIS — E039 Hypothyroidism, unspecified: Secondary | ICD-10-CM | POA: Diagnosis not present

## 2022-07-17 MED ORDER — HYDROXYZINE PAMOATE 25 MG PO CAPS: 25.0000 mg | ORAL_CAPSULE | Freq: Two times a day (BID) | ORAL | 1 refills | Status: DC | PRN

## 2022-07-17 MED ORDER — DULOXETINE HCL 20 MG PO CPEP: 20.0000 mg | ORAL_CAPSULE | Freq: Every day | ORAL | 1 refills | Status: DC

## 2022-07-17 MED ORDER — DULOXETINE HCL 30 MG PO CPEP: 30.0000 mg | ORAL_CAPSULE | Freq: Every day | ORAL | 1 refills | Status: DC

## 2022-07-17 MED ORDER — BUSPIRONE HCL 7.5 MG PO TABS: 7.5000 mg | ORAL_TABLET | Freq: Every morning | ORAL | 0 refills | Status: DC

## 2022-07-17 NOTE — Progress Notes (Signed)
Virtual Visit via Video Note  I connected with [name] [name] on 07/17/22 at  3:00 PM EDT by a video enabled telemedicine application and verified that I am speaking with the correct person using two identifiers. Location Provider Location : Remote office  Patient Location : Home  Participants: Patient , Provider    I discussed the limitations of evaluation and management by telemedicine and the availability of in person appointments. The patient expressed understanding and agreed to proceed.   I discussed the assessment and treatment plan with the patient. The patient was provided an opportunity to ask questions and all were answered. The patient agreed with the plan and demonstrated an understanding of the instructions.   The patient was advised to call back or seek an in-person evaluation if the symptoms worsen or if the condition fails to improve as anticipated.   BH MD OP Progress Note  07/17/2022 3:22 PM [name] [name]  MRN:  [id number]  Chief Complaint:  Chief Complaint  Patient presents with   Depression   Anxiety   Follow-up   Medication Refill   HPI: [name] [name] is a 62 year old Caucasian male, married, on disability, lives in [place], has a history of PTSD, MDD, panic attack, fibromyalgia, degenerative disc disease, carpal tunnel syndrome, obesity was evaluated by telemedicine today.  Patient today reports he continues to struggle with chronic pain.  Currently on oxycodone as well as muscle relaxants, does not clearly remember the names of his muscle relaxant.  Patient reports he is currently in a weight loss program.  Reports he has to attend the program for 3 to 4 months.  He is excited about that.  He continues to struggle with depression and anxiety, unable to elaborate further.  Reports he is currently compliant on the duloxetine.  However when he takes duloxetine with all his other medications including muscle relaxants he reports he feels tired.  He hence has been taking  it at night.  Sleep is restless often.  He does have sleep apnea and has been trying to use CPAP religiously.  He also has pain which does have an impact on his sleep.  Patient also is on BuSpar 7.5 mg 3 times daily.  Not sure if this is beneficial.  Patient denies any suicidality, homicidality or perceptual disturbances.  Has been unable to find a therapist however is motivated to establish care.  Reports good support system from his wife.  Patient denies any other concerns today.  Visit Diagnosis:    ICD-10-CM   1. PTSD (post-traumatic stress disorder)  F43.10 DULoxetine (CYMBALTA) 30 MG capsule    DULoxetine (CYMBALTA) 20 MG capsule    hydrOXYzine (VISTARIL) 25 MG capsule    2. MDD (major depressive disorder), recurrent episode, moderate (HCC)  F33.1 DULoxetine (CYMBALTA) 30 MG capsule    DULoxetine (CYMBALTA) 20 MG capsule    hydrOXYzine (VISTARIL) 25 MG capsule      Past Psychiatric History: I have reviewed past psychiatric history from progress note on 06/10/2022.  Past trials of medications like Zoloft, BuSpar, trazodone, Paxil, Abilify.  Past Medical History:  Past Medical History:  Diagnosis Date   Acid reflux    Anxiety    Aortic root dilatation (HCC) 12/30/2019   a.) TTE 12/30/2019: measured 39 mm.   Arthritis    Carpal tunnel syndrome, bilateral    Depression    Diastolic dysfunction 12/30/2019   a.) TTE 12/30/2019: norm LV size; mod LVH; EF 55-60% with diastolic dysfunction (details Re: grade not available); norm  RV size and function; mod LAE; triv MR/TR; norm AoV.   Diverticulitis    Dyspnea    Fibromyalgia    GERD (gastroesophageal reflux disease)    Glaucoma    Hepatitis C    High cholesterol    History of kidney stones    Hypothyroidism    Juvenile epilepsy (HCC)    OSA on CPAP    Positive TB test     Past Surgical History:  Procedure Laterality Date   COLONOSCOPY WITH PROPOFOL N/A 02/01/2021   Procedure: COLONOSCOPY WITH PROPOFOL;  Surgeon:  Regis Bill, MD;  Location: [facility] ENDOSCOPY;  Service: Endoscopy;  Laterality: N/A;   ESOPHAGOGASTRODUODENOSCOPY (EGD) WITH PROPOFOL N/A 02/01/2021   Procedure: ESOPHAGOGASTRODUODENOSCOPY (EGD) WITH PROPOFOL;  Surgeon: Regis Bill, MD;  Location: [facility] ENDOSCOPY;  Service: Endoscopy;  Laterality: N/A;   LIVER BIOPSY      Family Psychiatric History: I have reviewed family psychiatric history from progress note on 06/10/2022.  Family History:  Family History  Problem Relation Age of Onset   Aneurysm Mother    Cancer Father    Lung cancer Father    Mental illness Sister    Mental illness Sister    Brain cancer Maternal Grandmother    Emphysema Maternal Grandfather    Drug abuse Maternal Uncle    Drug abuse Paternal Aunt     Social History: I have reviewed social history from progress note on 06/10/2022. Social History   Socioeconomic History   Marital status: Married    Spouse name: marice   Number of children: 1   Years of education: Not on file   Highest education level: Not on file  Occupational History   Not on file  Tobacco Use   Smoking status: Never    Passive exposure: Current   Smokeless tobacco: Never  Vaping Use   Vaping Use: Never used  Substance and Sexual Activity   Alcohol use: Not Currently   Drug use: Yes    Frequency: 2.5 times per week    Types: Marijuana    Comment: occasional   Sexual activity: Not on file  Other Topics Concern   Not on file  Social History Narrative   Not on file   Social Determinants of Health   Financial Resource Strain: Low Risk  (01/28/2022)   Overall Financial Resource Strain (CARDIA)    Difficulty of Paying Living Expenses: Not hard at all  Food Insecurity: No Food Insecurity (01/28/2022)   Hunger Vital Sign    Worried About Running Out of Food in the Last Year: Never true    Ran Out of Food in the Last Year: Never true  Transportation Needs: No Transportation Needs (01/28/2022)   PRAPARE -  Administrator, Civil Service (Medical): No    Lack of Transportation (Non-Medical): No  Physical Activity: Insufficiently Active (01/28/2022)   Exercise Vital Sign    Days of Exercise per Week: 2 days    Minutes of Exercise per Session: 20 min  Stress: Stress Concern Present (01/28/2022)   [employer] of Occupational Health - Occupational Stress Questionnaire    Feeling of Stress : To some extent  Social Connections: Moderately Integrated (01/28/2022)   Social Connection and Isolation Panel [NHANES]    Frequency of Communication with Friends and Family: Three times a week    Frequency of Social Gatherings with Friends and Family: Once a week    Attends Religious Services: More than 4 times per year  Active Member of Clubs or Organizations: No    Attends Banker Meetings: Never    Marital Status: Married    Allergies:  Allergies  Allergen Reactions   Ciprofloxacin     Pt reports joint swelling and pain   Tylenol [Acetaminophen] Other (See Comments)    Was told not to take d/t having hep c treatment   Celecoxib Anxiety   Zoloft [Sertraline Hcl] Anxiety    Metabolic Disorder Labs: Lab Results  Component Value Date   HGBA1C 5.5 03/25/2022   No results found for: "PROLACTIN" Lab Results  Component Value Date   CHOL 171 03/25/2022   TRIG 205 (H) 03/25/2022   HDL 44 03/25/2022   CHOLHDL 3.9 03/25/2022   LDLCALC 92 03/25/2022   LDLCALC 65 11/08/2021   Lab Results  Component Value Date   TSH 0.684 10/09/2021   TSH 0.405 (L) 06/12/2021    Therapeutic Level Labs: No results found for: "LITHIUM" No results found for: "VALPROATE" No results found for: "CBMZ"  Current Medications: Current Outpatient Medications  Medication Sig Dispense Refill   baclofen (LIORESAL) 10 MG tablet Take 10 mg by mouth 3 (three) times daily as needed.     DULoxetine (CYMBALTA) 20 MG capsule Take 1 capsule (20 mg total) by mouth daily. Take daily along with 30  mg 30 capsule 1   naloxone (NARCAN) nasal spray 4 mg/0.1 mL SMARTSIG:Both Nares     busPIRone (BUSPAR) 7.5 MG tablet Take 1 tablet (7.5 mg total) by mouth in the morning. 7 tablet 0   diazepam (VALIUM) 5 MG tablet Take 1 tablet (5 mg total) by mouth once as needed for up to 1 dose (for MRI scan). (Patient not taking: Reported on 06/10/2022) 1 tablet 0   DULoxetine (CYMBALTA) 30 MG capsule Take 1 capsule (30 mg total) by mouth daily. Take daily along with 20 mg 30 capsule 1   hydrOXYzine (VISTARIL) 25 MG capsule Take 1 capsule (25 mg total) by mouth 2 (two) times daily as needed for anxiety. Please limit use 60 capsule 1   levothyroxine (SYNTHROID) 50 MCG tablet Take 1 tablet (50 mcg total) by mouth daily before breakfast. Please schedule office visit before any future refill. 90 tablet 0   NON FORMULARY cpap device     omeprazole (PRILOSEC) 40 MG capsule Take 1 capsule (40 mg total) by mouth 2 (two) times daily before a meal. 180 capsule 1   oxyCODONE (OXY IR/ROXICODONE) 5 MG immediate release tablet Take 1 tablet (5 mg total) by mouth every 4 (four) hours as needed for severe pain. 30 tablet 0   oxyCODONE (OXY IR/ROXICODONE) 5 MG immediate release tablet Take 1 tablet (5 mg total) by mouth every 4 (four) hours as needed for severe pain. 30 tablet 0   rosuvastatin (CRESTOR) 10 MG tablet Take 1 tablet (10 mg total) by mouth daily. 90 tablet 3   No current facility-administered medications for this visit.     Musculoskeletal: Strength & Muscle Tone:  UTA Gait & Station:  Seated Patient leans: N/A  Psychiatric Specialty Exam: Review of Systems  Psychiatric/Behavioral:  Positive for dysphoric mood and sleep disturbance. The patient is nervous/anxious.     There were no vitals taken for this visit.There is no height or weight on file to calculate BMI.  General Appearance: Fairly Groomed  Eye Contact:  Good  Speech:  Clear and Coherent  Volume:  Normal  Mood:  Anxious and Depressed  Affect:   Appropriate  Thought  Process:  Goal Directed and Descriptions of Associations: Intact  Orientation:  Full (Time, Place, and Person)  Thought Content: Logical   Suicidal Thoughts:  No  Homicidal Thoughts:  No  Memory:  Immediate;   Fair Recent;   Fair Remote;   Fair  Judgement:  Fair  Insight:  Fair  Psychomotor Activity:  Normal  Concentration:  Concentration: Fair and Attention Span: Fair  Recall:  [employer] of Knowledge: Fair  Language: Fair  Akathisia:  No  Handed:  Right  AIMS (if indicated): not done  Assets:  Communication Skills Desire for Improvement Housing Intimacy Social Support  ADL's:  Intact  Cognition: WNL  Sleep:   restless due to pain   Screenings: GAD-7    Flowsheet Row Office Visit from 06/10/2022 in [facility] Office Visit from 10/09/2021 in [facility]  Total GAD-7 Score 10 13      Mini-Mental    Flowsheet Row Office Visit from 01/19/2020 in [facility] Office Visit from 12/08/2018 in [facility]  Total Score (max 30 points ) 30 30      PHQ2-9    Flowsheet Row Office Visit from 06/10/2022 in [facility] Office Visit from 03/25/2022 in [facility] Clinical Support from 01/28/2022 in [facility] Office Visit from 11/08/2021 in [facility] Office Visit from 10/29/2021 in [facility]  PHQ-2 Total Score 3 6 3 6 6   PHQ-9 Total Score 14 22 -- 21 18      Flowsheet Row Video Visit from 07/17/2022 in [facility] Office Visit from 06/10/2022 in [facility] ED from 04/08/2022 in [facility] at [facility]  C-SSRS RISK CATEGORY No Risk No Risk No Risk        Assessment and Plan: [name] [name] is a  62 year old Caucasian male, married, disability, lives in [place] was evaluated by telemedicine today.  Patient with multiple medical problems including chronic pain, morbid obesity, PTSD, MDD, continues to struggle with mood symptoms, will benefit from the following plan.  Plan PTSD-some progress Patient referred for CBT-encouraged to establish care. Taper of BuSpar, advised to take BuSpar 7.5 mg p.o. daily for a week and stop taking it. Increase Duloxetine to 50 mg p.o. daily.  Patient advised to start this higher dosage once he stops the BuSpar completely. Will consider sleep medication as needed in the future.  Sleep problems likely multifactorial including chronic pain.  Patient encouraged to continue CPAP for OSA.  MDD-some improvement Increase Duloxetine to 50 mg p.o. daily Patient advised to establish care with a therapist.  Patient does have resources available, provided last visit.  Follow-up in clinic in 6 weeks or sooner if needed.  Collaboration of Care: Collaboration of Care: Referral or follow-up with counselor/therapist AEB encouraged to establish care with therapist.  Patient/Guardian was advised Release of Information must be obtained prior to any record release in order to collaborate their care with an outside provider. Patient/Guardian was advised if they have not already done so to contact the registration department to sign all necessary forms in order for [place] to release information regarding their care.   Consent: Patient/Guardian gives verbal consent for treatment and assignment of benefits for services provided during this visit. Patient/Guardian expressed understanding and agreed to proceed.   This note was generated in part  or whole with voice recognition software. Voice recognition is usually quite accurate but there are transcription errors that can and very often do occur. I apologize for any typographical errors that were not detected and  corrected.    Jomarie Longs, MD 07/17/2022, 3:22 PM

## 2022-07-21 ENCOUNTER — Ambulatory Visit (INDEPENDENT_AMBULATORY_CARE_PROVIDER_SITE_OTHER): Payer: 59 | Admitting: Orthopedic Surgery

## 2022-07-21 DIAGNOSIS — M5416 Radiculopathy, lumbar region: Secondary | ICD-10-CM | POA: Diagnosis not present

## 2022-07-21 NOTE — Progress Notes (Signed)
Orthopedic Spine Surgery Office Note   Assessment: Patient is a 62 y.o. male with low back pain that radiates into his groin. Has disc herniation at stenosis at L1/2 that may be causing his groin pain     Plan: -Patient has tried physical therapy, NSAIDs, lyrica, narcotics, lumbar steroid injection, pain management -Continue with pain management as it is helping -Reiterated the fact that he will need to lose weight in order to be a surgical candidate. Gave him a weight goal of 300lbs -Patient should return to office after his gastric sleeve, x-rays at next visit: AP/lateral/flex/ex lumbar     Patient expressed understanding of the plan and all questions were answered to the patient's satisfaction.    ___________________________________________________________________________     History:   Patient is a 62 y.o. male who has been previously seen in the office for his low back pain that radiates into the groin.  It does not radiate past the groin on either side.  He also noticed significant relief with a right L1/2 injection with Dr. Alvester Morin on 06/16/2022.  He said he got a little under a week of 80 to 90% relief of his pain with that injection.  He was able to do a lot more activity as a result of the improved pain control.  Then, his pain returned and it was severe in nature.  He was able to establish with pain management around that time and has been taking oxycodone.  He says that his pain is under much better control now.  He is set up to have a gastric sleeve procedure in September to help with weight loss.  Denies paresthesias and numbness.  Has not noticed any weakness in either lower extremity.  No bowel or bladder incontinence.     Treatments tried: physical therapy, NSAIDs, lyrica, narcotics, lumbar steroid injection, pain management   Physical Exam:   General: no acute distress, appears stated age Neurologic: alert, answering questions appropriately, following commands Respiratory:  unlabored breathing on room air, symmetric chest rise Psychiatric: appropriate affect, normal cadence to speech     MSK (spine):   -Strength exam                                                   Left                  Right EHL                              5/5                  5/5 TA                                 5/5                  5/5 GSC                             5/5                  5/5 Knee extension            5/5  5/5 Hip flexion                    5/5                  5/5   -Sensory exam                           Sensation intact to light touch in L3-S1 nerve distributions of bilateral lower extremities   -Achilles DTR: 1/4 on the left, 1/4 on the right -Patellar tendon DTR: 1/4 on the left, 1/4 on the right   -Straight leg raise: negative bilaterally -Femoral nerve stretch test: negative bilaterally  -Clonus: no beats bilaterally   -Left hip exam: negative stinchfield, no pain through range of motion, negative FABER -Right hip exam: negative stinchfield, no pain through range of motion, negative FABER   Imaging: XR of the lumbar spine from 04/24/2022 was previously independently reviewed and interpreted, showing no significant degenerative changes. No fracture or dislocation. No evidence of instability on flexion/extension views.    CT of chest/abdomen/pelvis from 04/08/2022 showed a calcified disc herniation at L1/2.    MRI of the lumbar spine from 05/12/2022 was previously independently reviewed and interpreted, showing central disc herniation at L1/2 with central stenosis at that level.  Lateral recess stenosis at L2/3 and at L4/5.  Foraminal stenosis bilaterally at L4/5 and on the left at L5/S1     Patient name: [name] [name] Patient MRN: [id number] Date of visit: 07/21/22

## 2022-07-24 ENCOUNTER — Ambulatory Visit (INDEPENDENT_AMBULATORY_CARE_PROVIDER_SITE_OTHER): Payer: 59 | Admitting: Family Medicine

## 2022-07-24 ENCOUNTER — Encounter: Payer: Self-pay | Admitting: Family Medicine

## 2022-07-24 DIAGNOSIS — E039 Hypothyroidism, unspecified: Secondary | ICD-10-CM | POA: Diagnosis not present

## 2022-07-24 DIAGNOSIS — M659 Synovitis and tenosynovitis, unspecified: Secondary | ICD-10-CM | POA: Diagnosis not present

## 2022-07-24 DIAGNOSIS — Z7189 Other specified counseling: Secondary | ICD-10-CM

## 2022-07-24 DIAGNOSIS — E785 Hyperlipidemia, unspecified: Secondary | ICD-10-CM | POA: Diagnosis not present

## 2022-07-24 DIAGNOSIS — M76821 Posterior tibial tendinitis, right leg: Secondary | ICD-10-CM | POA: Diagnosis not present

## 2022-07-24 DIAGNOSIS — M4800 Spinal stenosis, site unspecified: Secondary | ICD-10-CM | POA: Diagnosis not present

## 2022-07-24 DIAGNOSIS — L600 Ingrowing nail: Secondary | ICD-10-CM | POA: Diagnosis not present

## 2022-07-24 DIAGNOSIS — M76822 Posterior tibial tendinitis, left leg: Secondary | ICD-10-CM | POA: Diagnosis not present

## 2022-07-24 DIAGNOSIS — M722 Plantar fascial fibromatosis: Secondary | ICD-10-CM | POA: Diagnosis not present

## 2022-07-24 DIAGNOSIS — M79674 Pain in right toe(s): Secondary | ICD-10-CM | POA: Diagnosis not present

## 2022-07-24 DIAGNOSIS — B351 Tinea unguium: Secondary | ICD-10-CM | POA: Diagnosis not present

## 2022-07-24 DIAGNOSIS — M79675 Pain in left toe(s): Secondary | ICD-10-CM | POA: Diagnosis not present

## 2022-07-24 DIAGNOSIS — Z6841 Body Mass Index (BMI) 40.0 and over, adult: Secondary | ICD-10-CM

## 2022-07-24 DIAGNOSIS — M79672 Pain in left foot: Secondary | ICD-10-CM | POA: Diagnosis not present

## 2022-07-24 DIAGNOSIS — M79671 Pain in right foot: Secondary | ICD-10-CM | POA: Diagnosis not present

## 2022-07-24 MED ORDER — ROSUVASTATIN CALCIUM 10 MG PO TABS: 10.0000 mg | ORAL_TABLET | Freq: Every day | ORAL | 3 refills | Status: DC

## 2022-07-24 MED ORDER — LEVOTHYROXINE SODIUM 50 MCG PO TABS: 50.0000 ug | ORAL_TABLET | Freq: Every day | ORAL | 3 refills | Status: DC

## 2022-07-24 NOTE — Progress Notes (Signed)
Established patient visit   Patient: [name] [name]   DOB: March 20, 1960   62 y.o. Male  MRN: [id number] Visit Date: 07/24/2022  Today's healthcare provider: Jacky Kindle, FNP   Re Introduced to nurse practitioner role and practice setting.  All questions answered.  Discussed provider/patient relationship and expectations.  Subjective    HPI  Paperwork for bariatric surgery.  Medications: Outpatient Medications Prior to Visit  Medication Sig   baclofen (LIORESAL) 10 MG tablet Take 10 mg by mouth 3 (three) times daily as needed.   DULoxetine (CYMBALTA) 20 MG capsule Take 1 capsule (20 mg total) by mouth daily. Take daily along with 30 mg   DULoxetine (CYMBALTA) 30 MG capsule Take 1 capsule (30 mg total) by mouth daily. Take daily along with 20 mg   hydrOXYzine (VISTARIL) 25 MG capsule Take 1 capsule (25 mg total) by mouth 2 (two) times daily as needed for anxiety. Please limit use   naloxone (NARCAN) nasal spray 4 mg/0.1 mL SMARTSIG:Both Nares   NON FORMULARY cpap device   omeprazole (PRILOSEC) 40 MG capsule Take 1 capsule (40 mg total) by mouth 2 (two) times daily before a meal.   oxyCODONE (OXY IR/ROXICODONE) 5 MG immediate release tablet Take 1 tablet (5 mg total) by mouth every 4 (four) hours as needed for severe pain.   oxyCODONE (OXY IR/ROXICODONE) 5 MG immediate release tablet Take 1 tablet (5 mg total) by mouth every 4 (four) hours as needed for severe pain.   [DISCONTINUED] busPIRone (BUSPAR) 7.5 MG tablet Take 1 tablet (7.5 mg total) by mouth in the morning. (Patient not taking: Reported on 07/24/2022)   [DISCONTINUED] diazepam (VALIUM) 5 MG tablet Take 1 tablet (5 mg total) by mouth once as needed for up to 1 dose (for MRI scan). (Patient not taking: Reported on 06/10/2022)   [DISCONTINUED] levothyroxine (SYNTHROID) 50 MCG tablet Take 1 tablet (50 mcg total) by mouth daily before breakfast. Please schedule office visit before any future refill.   [DISCONTINUED] rosuvastatin  (CRESTOR) 10 MG tablet Take 1 tablet (10 mg total) by mouth daily.   No facility-administered medications prior to visit.    Review of Systems  Last CBC Lab Results  Component Value Date   WBC 14.9 (H) 04/08/2022   HGB 15.7 04/08/2022   HCT 45.7 04/08/2022   MCV 85.6 04/08/2022   MCH 29.4 04/08/2022   RDW 12.3 04/08/2022   PLT 180 04/08/2022   Last metabolic panel Lab Results  Component Value Date   GLUCOSE 121 (H) 04/08/2022   NA 137 04/08/2022   K 4.0 04/08/2022   CL 104 04/08/2022   CO2 19 (L) 04/08/2022   BUN 21 (H) 04/08/2022   CREATININE 0.80 04/08/2022   GFRNONAA >60 04/08/2022   CALCIUM 9.4 04/08/2022   PHOS 3.4 07/24/2021   PROT 8.3 (H) 04/08/2022   ALBUMIN 4.2 04/08/2022   LABGLOB 3.0 03/25/2022   AGRATIO 1.5 03/25/2022   BILITOT 1.2 04/08/2022   ALKPHOS 61 04/08/2022   AST 30 04/08/2022   ALT 23 04/08/2022   ANIONGAP 14 04/08/2022   Last lipids Lab Results  Component Value Date   CHOL 171 03/25/2022   HDL 44 03/25/2022   LDLCALC 92 03/25/2022   TRIG 205 (H) 03/25/2022   CHOLHDL 3.9 03/25/2022   Last hemoglobin A1c Lab Results  Component Value Date   HGBA1C 5.5 03/25/2022   Last thyroid functions Lab Results  Component Value Date   TSH 0.684 10/09/2021  Objective    BP 120/75 (BP Location: Left Arm, Patient Position: Sitting, Cuff Size: Large)   Pulse 77   Temp 98.2 F (36.8 C) (Oral)   Wt (!) 337 lb (152.9 kg)   SpO2 97%   BMI 44.46 kg/m   BP Readings from Last 3 Encounters:  07/24/22 120/75  06/16/22 137/80  05/30/22 130/70   Wt Readings from Last 3 Encounters:  07/24/22 (!) 337 lb (152.9 kg)  05/30/22 (!) 340 lb 6.4 oz (154.4 kg)  05/05/22 (!) 343 lb (155.6 kg)   SpO2 Readings from Last 3 Encounters:  07/24/22 97%  05/30/22 96%  05/05/22 98%   Physical Exam Vitals and nursing note reviewed.  Constitutional:      Appearance: Normal appearance. He is obese.  HENT:     Head: Normocephalic and atraumatic.   Cardiovascular:     Rate and Rhythm: Normal rate and regular rhythm.     Pulses: Normal pulses.     Heart sounds: Normal heart sounds.  Pulmonary:     Effort: Pulmonary effort is normal.     Breath sounds: Normal breath sounds.  Musculoskeletal:        General: Normal range of motion.     Cervical back: Normal range of motion.  Skin:    General: Skin is warm and dry.     Capillary Refill: Capillary refill takes less than 2 seconds.  Neurological:     General: No focal deficit present.     Mental Status: He is alert and oriented to person, place, and time. Mental status is at baseline.  Psychiatric:        Mood and Affect: Mood normal.        Behavior: Behavior normal.        Thought Content: Thought content normal.        Judgment: Judgment normal.   No results found for any visits on 07/24/22.  Assessment & Plan     Problem List Items Addressed This Visit       Endocrine   Hypothyroidism    Chronic, stable Denies concerns with symptoms indicative of hypo or hyper functioning Endorses chronic fatigue s/s obesity and depression Request refills provided       Relevant Medications   levothyroxine (SYNTHROID) 50 MCG tablet     Other   BMI 40.0-44.9, adult (HCC)   Dyslipidemia with elevated low density lipoprotein (LDL) cholesterol and abnormally low high density lipoprotein cholesterol    Chronic, previously stable Request for statin refill recommend diet low in saturated fat and regular exercise - 30 min at least 5 times per week       Relevant Medications   rosuvastatin (CRESTOR) 10 MG tablet   Morbid obesity (HCC) - Primary    Chronic, ~8lbs lost Continue to recommend balanced, lower carb meals. Smaller meal size, adding snacks. Choosing water as drink of choice and increasing purposeful exercise. Body mass index is 44.46 kg/m.       Spinal stenosis    Chronic, plans for surgical correction following WLS Followed by pain mgmt       Surgical counseling  visit    Plan for WLS later this Summer/Fall Form filled out and faxed by medical records      Return if symptoms worsen or fail to improve.     Addressed extensive list of chronic and acute medical problems today requiring 40 minutes reviewing his medical record, counseling patient regarding his conditions and coordination of care in addition to  completion of forms and review of chart to include previous labs and office visits and communication with cardiology.   Leilani Merl, FNP, have reviewed all documentation for this visit. The documentation on 07/24/22 for the exam, diagnosis, procedures, and orders are all accurate and complete.  Jacky Kindle, FNP  [facility] [phone] (phone) [phone] (fax)  [facility]

## 2022-07-24 NOTE — Assessment & Plan Note (Signed)
Chronic, plans for surgical correction following WLS Followed by pain mgmt

## 2022-07-24 NOTE — Assessment & Plan Note (Signed)
Plan for WLS later this Summer/Fall Form filled out and faxed by medical records

## 2022-07-24 NOTE — Assessment & Plan Note (Signed)
Chronic, previously stable Request for statin refill recommend diet low in saturated fat and regular exercise - 30 min at least 5 times per week

## 2022-07-24 NOTE — Assessment & Plan Note (Signed)
Chronic, ~8lbs lost Continue to recommend balanced, lower carb meals. Smaller meal size, adding snacks. Choosing water as drink of choice and increasing purposeful exercise. Body mass index is 44.46 kg/m.

## 2022-07-24 NOTE — Assessment & Plan Note (Signed)
Chronic, stable Denies concerns with symptoms indicative of hypo or hyper functioning Endorses chronic fatigue s/s obesity and depression Request refills provided

## 2022-07-30 DIAGNOSIS — M4800 Spinal stenosis, site unspecified: Secondary | ICD-10-CM | POA: Diagnosis not present

## 2022-07-30 DIAGNOSIS — M5440 Lumbago with sciatica, unspecified side: Secondary | ICD-10-CM | POA: Diagnosis not present

## 2022-07-30 DIAGNOSIS — Z79899 Other long term (current) drug therapy: Secondary | ICD-10-CM | POA: Diagnosis not present

## 2022-07-30 DIAGNOSIS — G8929 Other chronic pain: Secondary | ICD-10-CM | POA: Diagnosis not present

## 2022-08-04 DIAGNOSIS — Z79899 Other long term (current) drug therapy: Secondary | ICD-10-CM | POA: Diagnosis not present

## 2022-08-18 NOTE — Progress Notes (Unsigned)
Office Visit    Patient Name: [name] [name] Date of Encounter: 08/19/2022  Primary Care Provider:  Jacky Kindle, FNP Primary Cardiologist:  Yvonne Kendall, MD  Chief Complaint    62 year old male with history of diastolic dysfunction, hyperlipidemia, hepatitis C, hypothyroidism, OSA, seizure disorder in adolescence, diverticulitis, depression, anxiety, GERD and morbid obesity. He presents today for follow-up regarding diastolic dysfunction.  Past Medical History    Past Medical History:  Diagnosis Date   Acid reflux    Anxiety    Aortic root dilatation (HCC) 12/30/2019   a.) TTE 12/30/2019: measured 39 mm.   Arthritis    Carpal tunnel syndrome, bilateral    Depression    Diastolic dysfunction 12/30/2019   a.) TTE 12/30/2019: norm LV size; mod LVH; EF 55-60% with diastolic dysfunction (details Re: grade not available); norm RV size and function; mod LAE; triv MR/TR; norm AoV.   Diverticulitis    Dyspnea    Fibromyalgia    GERD (gastroesophageal reflux disease)    Glaucoma    Hepatitis C    High cholesterol    History of kidney stones    Hypothyroidism    Juvenile epilepsy (HCC)    OSA on CPAP    Positive TB test    Past Surgical History:  Procedure Laterality Date   COLONOSCOPY WITH PROPOFOL N/A 02/01/2021   Procedure: COLONOSCOPY WITH PROPOFOL;  Surgeon: Regis Bill, MD;  Location: [facility] ENDOSCOPY;  Service: Endoscopy;  Laterality: N/A;   ESOPHAGOGASTRODUODENOSCOPY (EGD) WITH PROPOFOL N/A 02/01/2021   Procedure: ESOPHAGOGASTRODUODENOSCOPY (EGD) WITH PROPOFOL;  Surgeon: Regis Bill, MD;  Location: [facility] ENDOSCOPY;  Service: Endoscopy;  Laterality: N/A;   LIVER BIOPSY    Allergies Allergies  Allergen Reactions   Ciprofloxacin     Pt reports joint swelling and pain   Tylenol [Acetaminophen] Other (See Comments)    Was told not to take d/t having hep c treatment   Celecoxib Anxiety   Zoloft [Sertraline Hcl] Anxiety   Labs/Other Studies Reviewed     The following studies were reviewed today: Cardiac Studies & Procedures       ECHOCARDIOGRAM  ECHOCARDIOGRAM COMPLETE 07/05/2022  Narrative ECHOCARDIOGRAM REPORT    Patient Name:   [name] [name] Date of Exam: 07/04/2022 Medical Rec #:  [id number]  Height:       73.0 in Accession #:    [id number] Weight:       340.2 lb Date of Birth:  [birth year] BSA:          2.697 m Patient Age:    61 years   BP:           134/74 mmHg Patient Gender: M          HR:           76 bpm. Exam Location:  [place]  Procedure: 2D Echo, 3D Echo, Cardiac Doppler, Color Doppler and Strain Analysis  Indications:    R06.02 SOB; I50.20* Unspecified systolic (congestive) heart failure  History:        Patient has prior history of Echocardiogram examinations, most recent 01/02/2020. CHF, Signs/Symptoms:Shortness of Breath; Risk Factors:Sleep Apnea, Dyslipidemia and Non-Smoker.  Sonographer:    Quentin Ore RDMS, RVT, RDCS Referring Phys: [id number] [name]  IMPRESSIONS   1. Left ventricular ejection fraction, by estimation, is 60 to 65%. The left ventricle has normal function. The left ventricle has no regional wall motion abnormalities. The left ventricular internal cavity size was mildly dilated. Left ventricular diastolic parameters are consistent  with Grade I diastolic dysfunction (impaired relaxation). The average left ventricular global longitudinal strain is -18.2 %. 2. Right ventricular systolic function is normal. The right ventricular size is normal. Tricuspid regurgitation signal is inadequate for assessing PA pressure. 3. Left atrial size was moderately dilated. 4. The mitral valve is normal in structure. No evidence of mitral valve regurgitation. No evidence of mitral stenosis. 5. The aortic valve has an indeterminant number of cusps. Aortic valve regurgitation is not visualized. No aortic stenosis is present. 6. There is borderline dilatation of the aortic root, measuring 37 mm. There is  borderline dilatation of the ascending aorta, measuring 37 mm. 7. The inferior vena cava is normal in size with greater than 50% respiratory variability, suggesting right atrial pressure of 3 mmHg.  FINDINGS Left Ventricle: Left ventricular ejection fraction, by estimation, is 60 to 65%. The left ventricle has normal function. The left ventricle has no regional wall motion abnormalities. The average left ventricular global longitudinal strain is -18.2 %. The left ventricular internal cavity size was mildly dilated. There is no left ventricular hypertrophy. Left ventricular diastolic parameters are consistent with Grade I diastolic dysfunction (impaired relaxation).  Right Ventricle: The right ventricular size is normal. No increase in right ventricular wall thickness. Right ventricular systolic function is normal. Tricuspid regurgitation signal is inadequate for assessing PA pressure.  Left Atrium: Left atrial size was moderately dilated.  Right Atrium: Right atrial size was normal in size.  Pericardium: There is no evidence of pericardial effusion.  Mitral Valve: The mitral valve is normal in structure. No evidence of mitral valve regurgitation. No evidence of mitral valve stenosis.  Tricuspid Valve: The tricuspid valve is normal in structure. Tricuspid valve regurgitation is not demonstrated. No evidence of tricuspid stenosis.  Aortic Valve: The aortic valve has an indeterminant number of cusps. Aortic valve regurgitation is not visualized. No aortic stenosis is present. Aortic valve mean gradient measures 6.5 mmHg. Aortic valve peak gradient measures 12.2 mmHg. Aortic valve area, by VTI measures 3.63 cm.  Pulmonic Valve: The pulmonic valve was normal in structure. Pulmonic valve regurgitation is not visualized. No evidence of pulmonic stenosis.  Aorta: The aortic root is normal in size and structure. There is borderline dilatation of the aortic root, measuring 37 mm. There is borderline  dilatation of the ascending aorta, measuring 37 mm.  Venous: The inferior vena cava is normal in size with greater than 50% respiratory variability, suggesting right atrial pressure of 3 mmHg.  IAS/Shunts: No atrial level shunt detected by color flow Doppler.   LEFT VENTRICLE PLAX 2D LVIDd:         5.80 cm      Diastology LVIDs:         3.70 cm      LV e' medial:    10.90 cm/s LV PW:         1.10 cm      LV E/e' medial:  8.4 LV IVS:        1.20 cm      LV e' lateral:   9.25 cm/s LVOT diam:     2.30 cm      LV E/e' lateral: 9.9 LV SV:         113 LV SV Index:   42           2D Longitudinal Strain LVOT Area:     4.15 cm     2D Strain GLS Avg:     -18.2 %  LV Volumes (MOD)  LV vol d, MOD A2C: 170.0 ml 3D Volume EF: LV vol d, MOD A4C: 158.0 ml 3D EF:        54 % LV vol s, MOD A2C: 76.7 ml  LV EDV:       241 ml LV vol s, MOD A4C: 67.7 ml  LV ESV:       111 ml LV SV MOD A2C:     93.3 ml  LV SV:        129 ml LV SV MOD A4C:     158.0 ml LV SV MOD BP:      93.2 ml  RIGHT VENTRICLE             IVC RV Basal diam:  4.60 cm     IVC diam: 2.30 cm RV Mid diam:    4.00 cm RV S prime:     21.30 cm/s TAPSE (M-mode): 3.8 cm  LEFT ATRIUM              Index LA diam:        4.00 cm  1.48 cm/m LA Vol (A2C):   107.0 ml 39.68 ml/m LA Vol (A4C):   66.5 ml  24.66 ml/m LA Biplane Vol: 91.4 ml  33.89 ml/m AORTIC VALVE                     PULMONIC VALVE AV Area (Vmax):    3.11 cm      PV Vmax:       1.08 m/s AV Area (Vmean):   3.30 cm      PV Peak grad:  4.7 mmHg AV Area (VTI):     3.63 cm AV Vmax:           175.00 cm/s AV Vmean:          115.500 cm/s AV VTI:            0.312 m AV Peak Grad:      12.2 mmHg AV Mean Grad:      6.5 mmHg LVOT Vmax:         131.00 cm/s LVOT Vmean:        91.700 cm/s LVOT VTI:          0.272 m LVOT/AV VTI ratio: 0.87  AORTA Ao Root diam: 3.70 cm Ao Asc diam:  3.70 cm Ao Arch diam: 3.2 cm  MITRAL VALVE MV Area (PHT): 3.88 cm    SHUNTS MV Decel Time:  196 msec    Systemic VTI:  0.27 m MV E velocity: 91.75 cm/s  Systemic Diam: 2.30 cm MV A velocity: 91.75 cm/s MV E/A ratio:  1.00  Julien Nordmann MD Electronically signed by Julien Nordmann MD Signature Date/Time: 07/05/2022/9:11:57 AM    Final            Recent Labs: 10/09/2021: TSH 0.684 04/08/2022: ALT 23; BUN 21; Creatinine, Ser 0.80; Hemoglobin 15.7; Platelets 180; Potassium 4.0; Sodium 137  Recent Lipid Panel    Component Value Date/Time   CHOL 171 03/25/2022 1358   TRIG 205 (H) 03/25/2022 1358   HDL 44 03/25/2022 1358   CHOLHDL 3.9 03/25/2022 1358   LDLCALC 92 03/25/2022 1358   History of Present Illness  62 year old male with history of diastolic dysfunction, hyperlipidemia, hepatitis C, hypothyroidism, OSA, seizure disorder in adolescence, diverticulitis, depression, anxiety, GERD and morbid obesity. In 12/2019 he had an echocardiogram that indicated normal LV size with moderate LVH, EF 55 to 60% with diastolic dysfunction (further details  not available).   First evaluated by Dr. Okey Dupre in 06/2021 for preoperative clearance, he had been referred because of questionable history of diastolic heart failure.  At that time he was doing well from a cardiac perspective was euvolemic on exam with NYHA class II symptoms.   His last seen in office on 05/30/2022 by Dr. Okey Dupre.  He had been feeling fairly well although continued to have exertional dyspnea, not as severe as when he had his first echocardiogram in 2021.  He denied chest pain but experienced some intermittent palpitations that he described as flutters, noted some improvement following an increase in omeprazole.  On 07/04/2022 he had a repeat echocardiogram that indicated an LVEF of 60 to 65%, LV with normal function, no regional wall motion abnormalities the left ventricular internal cavity size was mildly dilated with diastolic parameters consistent with grade 1 diastolic dysfunction (impaired relaxation). RV systolic function was  normal.   Today he reports he is doing very well. His shortness of breath has resolved since increasing his exercise and starting water aerobics for about two hours three times a week. He denies chest pain. Currently planning to have laparoscopic bariatric surgery to assist with weight loss. Denies chest pain, shortness of breath, palpitations, orthopnea or lower extremity swelling. Confirms he has been compliant with his CPAP.    Home Medications    Current Outpatient Medications  Medication Sig Dispense Refill   baclofen (LIORESAL) 10 MG tablet Take 10 mg by mouth 3 (three) times daily as needed.     DULoxetine (CYMBALTA) 30 MG capsule Take 1 capsule (30 mg total) by mouth daily. Take daily along with 20 mg 30 capsule 1   hydrOXYzine (VISTARIL) 25 MG capsule Take 1 capsule (25 mg total) by mouth 2 (two) times daily as needed for anxiety. Please limit use 60 capsule 1   levothyroxine (SYNTHROID) 50 MCG tablet Take 1 tablet (50 mcg total) by mouth daily before breakfast. 90 tablet 3   naloxone (NARCAN) nasal spray 4 mg/0.1 mL SMARTSIG:Both Nares     NON FORMULARY cpap device     omeprazole (PRILOSEC) 40 MG capsule Take 1 capsule (40 mg total) by mouth 2 (two) times daily before a meal. 180 capsule 1   oxyCODONE (OXY IR/ROXICODONE) 5 MG immediate release tablet Take 1 tablet (5 mg total) by mouth every 4 (four) hours as needed for severe pain. 30 tablet 0   oxyCODONE (OXY IR/ROXICODONE) 5 MG immediate release tablet Take 1 tablet (5 mg total) by mouth every 4 (four) hours as needed for severe pain. 30 tablet 0   rosuvastatin (CRESTOR) 10 MG tablet Take 1 tablet (10 mg total) by mouth daily. 90 tablet 3   DULoxetine (CYMBALTA) 20 MG capsule Take 1 capsule (20 mg total) by mouth daily. Take daily along with 30 mg (Patient not taking: Reported on 08/19/2022) 30 capsule 1   No current facility-administered medications for this visit.    Review of Systems   He denies chest pain, palpitations, dyspnea,  pnd, orthopnea, n, v, dizziness, syncope, edema, weight gain, or early satiety. All other systems reviewed and are otherwise negative except as noted above.  Physical Exam    VS:  BP 128/76 (BP Location: Left Arm, Patient Position: Sitting, Cuff Size: Large)   Pulse 67   Ht 6' (1.829 m)   Wt (!) 338 lb 6.4 oz (153.5 kg)   SpO2 98%   BMI 45.90 kg/m  , BMI Body mass index is 45.9 kg/m.  GEN: Well nourished, well developed, in no acute distress. HEENT: normal. Neck: Supple, no JVD, carotid bruits, or masses. Cardiac: RRR, no murmurs, rubs, or gallops. No clubbing, cyanosis, edema.  Radials/DP/PT 2+ and equal bilaterally.  Respiratory:  Respirations regular and unlabored, clear to auscultation bilaterally. GI: Soft, nontender, nondistended, BS + x 4. MS: no deformity or atrophy. Skin: warm and dry, no rash. Neuro:  Strength and sensation are intact. Psych: Normal affect.  Accessory Clinical Findings    ECG personally reviewed by me today - EKG Interpretation Date/Time:  Tuesday August 19 2022 14:00:40 EDT Ventricular Rate:  67 PR Interval:    QRS Duration:  94 QT Interval:  380 QTC Calculation: 401 R Axis:   -46  Text Interpretation: Normal sinus rhythm Left anterior fascicular block No acute changes Confirmed by Reather Littler [phone]) on 08/19/2022 2:04:18 PM  - no acute changes.   Lab Results  Component Value Date   WBC 14.9 (H) 04/08/2022   HGB 15.7 04/08/2022   HCT 45.7 04/08/2022   MCV 85.6 04/08/2022   PLT 180 04/08/2022   Lab Results  Component Value Date   CREATININE 0.80 04/08/2022   BUN 21 (H) 04/08/2022   NA 137 04/08/2022   K 4.0 04/08/2022   CL 104 04/08/2022   CO2 19 (L) 04/08/2022   Lab Results  Component Value Date   ALT 23 04/08/2022   AST 30 04/08/2022   ALKPHOS 61 04/08/2022   BILITOT 1.2 04/08/2022   Lab Results  Component Value Date   CHOL 171 03/25/2022   HDL 44 03/25/2022   LDLCALC 92 03/25/2022   TRIG 205 (H) 03/25/2022   CHOLHDL 3.9  03/25/2022    Lab Results  Component Value Date   HGBA1C 5.5 03/25/2022    Assessment & Plan    Diastolic dysfunction/DOE: In 12/2019 he had an echocardiogram that indicated normal LV size with moderate LVH, EF 55 to 60% with diastolic dysfunction (further details not available). Repeat echo from 06/2022 indicated EF 60 to 65%, with mildly dilated LV internal cavity and grade I diastolic dysfunction. Today he reports his dyspnea has resolved with exercise and water aerobics. He appears euvolemic and well compensated on exam. Stable with no anginal symptoms. No indication for ischemic evaluation.  Continue rosuvastatin.   Pre-operative clearance: He presents today for pre-operative clearance for laparoscopic bariatric surgery with [facility]. His revise cardiac risk index indicates class I risk with 3.9% 30 day risk of death, MI or cardiac arrest. He is able to exert himself beyond 4 METS without chest pain or dyspnea on exertion. No further cardiac testing recommended prior to surgery, patient may proceed with surgery at acceptable risk. Discussed with patient that if new anginal symptoms occur prior to procedure, pre-operative clearance approval would no longer be in effect. Paper clearance requested faxed to [facility].   Palpitations: Denies further palpitations.   Hyperlipidemia: Last lipid panel 03/2022 indicated a total cholesterol of 171 and LDL of 92. Followed by primary care. Continue rosuvastatin.    OSA: Confirms CPAP compliance.   Obesity: Planning to undergo laparoscopic bariatric surgery. Continues to work towards weight loss with diet improvement and exercise.   Disposition: Follow up in 6 months with Dr. Okey Dupre or Eula Listen, PA.       Rip Harbour, NP 08/19/2022, 2:57 PM

## 2022-08-19 ENCOUNTER — Encounter: Payer: Self-pay | Admitting: Physician Assistant

## 2022-08-19 ENCOUNTER — Ambulatory Visit: Payer: 59 | Attending: Cardiology | Admitting: Cardiology

## 2022-08-19 VITALS — BP 128/76 | HR 67 | Ht 72.0 in | Wt 338.4 lb

## 2022-08-19 DIAGNOSIS — E7800 Pure hypercholesterolemia, unspecified: Secondary | ICD-10-CM | POA: Diagnosis not present

## 2022-08-19 DIAGNOSIS — G4733 Obstructive sleep apnea (adult) (pediatric): Secondary | ICD-10-CM

## 2022-08-19 DIAGNOSIS — I5189 Other ill-defined heart diseases: Secondary | ICD-10-CM

## 2022-08-19 DIAGNOSIS — R002 Palpitations: Secondary | ICD-10-CM | POA: Diagnosis not present

## 2022-08-19 DIAGNOSIS — R0609 Other forms of dyspnea: Secondary | ICD-10-CM

## 2022-08-19 DIAGNOSIS — I5032 Chronic diastolic (congestive) heart failure: Secondary | ICD-10-CM

## 2022-08-19 DIAGNOSIS — Z6841 Body Mass Index (BMI) 40.0 and over, adult: Secondary | ICD-10-CM

## 2022-08-19 DIAGNOSIS — Z01818 Encounter for other preprocedural examination: Secondary | ICD-10-CM

## 2022-08-19 NOTE — Patient Instructions (Signed)
Medication Instructions:  The current medical regimen is effective;  continue present plan and medications.  *If you need a refill on your cardiac medications before your next appointment, please call your pharmacy*   Follow-Up: At [facility], you and your health needs are our priority.  As part of our continuing mission to provide you with exceptional heart care, we have created designated Provider Care Teams.  These Care Teams include your primary Cardiologist (physician) and Advanced Practice Providers (APPs -  Physician Assistants and Nurse Practitioners) who all work together to provide you with the care you need, when you need it.  We recommend signing up for the patient portal called "MyChart".  Sign up information is provided on this After Visit Summary.  MyChart is used to connect with patients for Virtual Visits (Telemedicine).  Patients are able to view lab/test results, encounter notes, upcoming appointments, etc.  Non-urgent messages can be sent to your provider as well.   To learn more about what you can do with MyChart, go to [email or web address].    Your next appointment:   6 month(s)  Provider:   Yvonne Kendall, MD or Eula Listen, PA-C

## 2022-08-27 DIAGNOSIS — M5440 Lumbago with sciatica, unspecified side: Secondary | ICD-10-CM | POA: Diagnosis not present

## 2022-08-27 DIAGNOSIS — E559 Vitamin D deficiency, unspecified: Secondary | ICD-10-CM | POA: Diagnosis not present

## 2022-08-27 DIAGNOSIS — Z79899 Other long term (current) drug therapy: Secondary | ICD-10-CM | POA: Diagnosis not present

## 2022-08-27 DIAGNOSIS — G8929 Other chronic pain: Secondary | ICD-10-CM | POA: Diagnosis not present

## 2022-08-27 DIAGNOSIS — M4800 Spinal stenosis, site unspecified: Secondary | ICD-10-CM | POA: Diagnosis not present

## 2022-08-29 DIAGNOSIS — Z79899 Other long term (current) drug therapy: Secondary | ICD-10-CM | POA: Diagnosis not present

## 2022-08-29 DIAGNOSIS — G4733 Obstructive sleep apnea (adult) (pediatric): Secondary | ICD-10-CM | POA: Diagnosis not present

## 2022-09-01 DIAGNOSIS — M6281 Muscle weakness (generalized): Secondary | ICD-10-CM | POA: Diagnosis not present

## 2022-09-01 DIAGNOSIS — Z723 Lack of physical exercise: Secondary | ICD-10-CM | POA: Diagnosis not present

## 2022-09-05 ENCOUNTER — Ambulatory Visit: Payer: 59 | Admitting: Cardiology

## 2022-09-08 ENCOUNTER — Encounter: Payer: Self-pay | Admitting: Psychiatry

## 2022-09-08 ENCOUNTER — Telehealth: Payer: 59 | Admitting: Psychiatry

## 2022-09-08 DIAGNOSIS — F4310 Post-traumatic stress disorder, unspecified: Secondary | ICD-10-CM

## 2022-09-08 DIAGNOSIS — F3342 Major depressive disorder, recurrent, in full remission: Secondary | ICD-10-CM

## 2022-09-08 MED ORDER — HYDROXYZINE PAMOATE 25 MG PO CAPS: 25.0000 mg | ORAL_CAPSULE | Freq: Two times a day (BID) | ORAL | 1 refills | Status: DC | PRN

## 2022-09-08 MED ORDER — DULOXETINE HCL 20 MG PO CPEP: 20.0000 mg | ORAL_CAPSULE | Freq: Every day | ORAL | 1 refills | Status: DC

## 2022-09-08 MED ORDER — DULOXETINE HCL 30 MG PO CPEP: 30.0000 mg | ORAL_CAPSULE | Freq: Every day | ORAL | 1 refills | Status: DC

## 2022-09-08 NOTE — Progress Notes (Unsigned)
Virtual Visit via Video Note  I connected with [name] [name] on 09/08/22 at  1:20 PM EDT by a video enabled telemedicine application and verified that I am speaking with the correct person using two identifiers.  Location Provider Location : [facility] Patient Location : Home  Participants: Patient , Provider   I discussed the limitations of evaluation and management by telemedicine and the availability of in person appointments. The patient expressed understanding and agreed to proceed.    I discussed the assessment and treatment plan with the patient. The patient was provided an opportunity to ask questions and all were answered. The patient agreed with the plan and demonstrated an understanding of the instructions.   The patient was advised to call back or seek an in-person evaluation if the symptoms worsen or if the condition fails to improve as anticipated.    BH MD OP Progress Note  09/08/2022 4:20 PM [name] [name]  MRN:  [id number]  Chief Complaint:  Chief Complaint  Patient presents with   Follow-up   Depression   Anxiety   Medication Refill   HPI: [name] [name] is a 62 year old Caucasian male, married, on disability, lives in [place], has a history of PTSD, MDD, panic attacks, fibromyalgia, degenerative disc disease, carpal tunnel syndrome, obesity was evaluated by telemedicine today.  Patient today reports he is currently doing better with regards to his mood symptoms.  Continues to struggle with lack of motivation although it is improving.  Denies any significant sadness, hopelessness.  Reports when he is able to participate in activities he actually enjoys it more than he used to before.  The Cymbalta higher dosage is beneficial.  Was able to taper himself off of the BuSpar as recommended last visit.  Patient reports he is currently struggling with sleep.  He does have left-sided knee pain which does affect his sleep.  Planning to get a brace today.  Also planning to talk to his  pain provider.  Not interested in additional sleep medication.  Will Pharmacist, hospital know if interested.  Patient denies any suicidality, homicidality or perceptual disturbances.  Patient appeared to be alert, oriented to person place time situation.  Patient denies side effects to medications.  Patient denies any other concerns today.  Visit Diagnosis:    ICD-10-CM   1. PTSD (post-traumatic stress disorder)  F43.10 DULoxetine (CYMBALTA) 30 MG capsule    DULoxetine (CYMBALTA) 20 MG capsule    hydrOXYzine (VISTARIL) 25 MG capsule    2. Recurrent major depressive disorder, in full remission (HCC)  F33.42 DULoxetine (CYMBALTA) 30 MG capsule    DULoxetine (CYMBALTA) 20 MG capsule    hydrOXYzine (VISTARIL) 25 MG capsule      Past Psychiatric History: I have reviewed past psychiatric history from progress note on 06/10/2022.  Past trials of medications like Zoloft, BuSpar, trazodone, Paxil, Abilify.  Past Medical History:  Past Medical History:  Diagnosis Date   Acid reflux    Anxiety    Aortic root dilatation (HCC) 12/30/2019   a.) TTE 12/30/2019: measured 39 mm.   Arthritis    Carpal tunnel syndrome, bilateral    Depression    Diastolic dysfunction 12/30/2019   a.) TTE 12/30/2019: norm LV size; mod LVH; EF 55-60% with diastolic dysfunction (details Re: grade not available); norm RV size and function; mod LAE; triv MR/TR; norm AoV.   Diverticulitis    Dyspnea    Fibromyalgia    GERD (gastroesophageal reflux disease)    Glaucoma    Hepatitis C  High cholesterol    History of kidney stones    Hypothyroidism    Juvenile epilepsy (HCC)    OSA on CPAP    Positive TB test     Past Surgical History:  Procedure Laterality Date   COLONOSCOPY WITH PROPOFOL N/A 02/01/2021   Procedure: COLONOSCOPY WITH PROPOFOL;  Surgeon: Regis Bill, MD;  Location: [facility] ENDOSCOPY;  Service: Endoscopy;  Laterality: N/A;   ESOPHAGOGASTRODUODENOSCOPY (EGD) WITH PROPOFOL N/A 02/01/2021    Procedure: ESOPHAGOGASTRODUODENOSCOPY (EGD) WITH PROPOFOL;  Surgeon: Regis Bill, MD;  Location: [facility] ENDOSCOPY;  Service: Endoscopy;  Laterality: N/A;   LIVER BIOPSY      Family Psychiatric History: I have reviewed family psychiatric history from my progress note on 06/10/2022.  Family History:  Family History  Problem Relation Age of Onset   Aneurysm Mother    Cancer Father    Lung cancer Father    Mental illness Sister    Mental illness Sister    Brain cancer Maternal Grandmother    Emphysema Maternal Grandfather    Drug abuse Maternal Uncle    Drug abuse Paternal Aunt     Social History: I have reviewed Social History   Socioeconomic History   Marital status: Married    Spouse name: Engineer, water   Number of children: 1   Years of education: Not on file   Highest education level: Not on file  Occupational History   Not on file  Tobacco Use   Smoking status: Never    Passive exposure: Current   Smokeless tobacco: Never  Vaping Use   Vaping status: Never Used  Substance and Sexual Activity   Alcohol use: Not Currently   Drug use: Yes    Frequency: 2.5 times per week    Types: Marijuana    Comment: occasional   Sexual activity: Not on file  Other Topics Concern   Not on file  Social History Narrative   Not on file   Social Determinants of Health   Financial Resource Strain: Low Risk  (01/28/2022)   Overall Financial Resource Strain (CARDIA)    Difficulty of Paying Living Expenses: Not hard at all  Food Insecurity: No Food Insecurity (01/28/2022)   Hunger Vital Sign    Worried About Running Out of Food in the Last Year: Never true    Ran Out of Food in the Last Year: Never true  Transportation Needs: No Transportation Needs (01/28/2022)   PRAPARE - Administrator, Civil Service (Medical): No    Lack of Transportation (Non-Medical): No  Physical Activity: Insufficiently Active (01/28/2022)   Exercise Vital Sign    Days of Exercise per Week: 2  days    Minutes of Exercise per Session: 20 min  Stress: Stress Concern Present (01/28/2022)   [employer] of Occupational Health - Occupational Stress Questionnaire    Feeling of Stress : To some extent  Social Connections: Unknown (05/21/2022)   [facility] from [facility]   Social Network    Social Network: Not on file    Allergies:  Allergies  Allergen Reactions   Ciprofloxacin     Pt reports joint swelling and pain   Tylenol [Acetaminophen] Other (See Comments)    Was told not to take d/t having hep c treatment   Celecoxib Anxiety   Zoloft [Sertraline Hcl] Anxiety    Metabolic Disorder Labs: Lab Results  Component Value Date   HGBA1C 5.5 03/25/2022   No results found for: "PROLACTIN" Lab Results  Component Value Date   CHOL 171 03/25/2022   TRIG 205 (H) 03/25/2022   HDL 44 03/25/2022   CHOLHDL 3.9 03/25/2022   LDLCALC 92 03/25/2022   LDLCALC 65 11/08/2021   Lab Results  Component Value Date   TSH 0.684 10/09/2021   TSH 0.405 (L) 06/12/2021    Therapeutic Level Labs: No results found for: "LITHIUM" No results found for: "VALPROATE" No results found for: "CBMZ"  Current Medications: Current Outpatient Medications  Medication Sig Dispense Refill   baclofen (LIORESAL) 10 MG tablet Take 10 mg by mouth 3 (three) times daily as needed.     levothyroxine (SYNTHROID) 50 MCG tablet Take 1 tablet (50 mcg total) by mouth daily before breakfast. 90 tablet 3   naloxone (NARCAN) nasal spray 4 mg/0.1 mL SMARTSIG:Both Nares     NON FORMULARY cpap device     omeprazole (PRILOSEC) 40 MG capsule Take 1 capsule (40 mg total) by mouth 2 (two) times daily before a meal. 180 capsule 1   oxyCODONE (OXY IR/ROXICODONE) 5 MG immediate release tablet Take 1 tablet (5 mg total) by mouth every 4 (four) hours as needed for severe pain. 30 tablet 0   oxyCODONE (OXY IR/ROXICODONE) 5 MG immediate release tablet Take 1 tablet (5 mg total) by mouth every 4 (four) hours as  needed for severe pain. 30 tablet 0   rosuvastatin (CRESTOR) 10 MG tablet Take 1 tablet (10 mg total) by mouth daily. 90 tablet 3   DULoxetine (CYMBALTA) 20 MG capsule Take 1 capsule (20 mg total) by mouth daily. Take daily along with 30 mg 30 capsule 1   DULoxetine (CYMBALTA) 30 MG capsule Take 1 capsule (30 mg total) by mouth daily. Take daily along with 20 mg 30 capsule 1   hydrOXYzine (VISTARIL) 25 MG capsule Take 1 capsule (25 mg total) by mouth 2 (two) times daily as needed for anxiety. Please limit use 60 capsule 1   No current facility-administered medications for this visit.     Musculoskeletal: Strength & Muscle Tone:  UTA Gait & Station:  Seated Patient leans: N/A  Psychiatric Specialty Exam: Review of Systems  Musculoskeletal:        Knee pain, left sided  Psychiatric/Behavioral:  Positive for sleep disturbance.     There were no vitals taken for this visit.There is no height or weight on file to calculate BMI.  General Appearance: Fairly Groomed  Eye Contact:  Fair  Speech:  Clear and Coherent  Volume:  Normal  Mood:  Euthymic  Affect:  Appropriate  Thought Process:  Goal Directed and Descriptions of Associations: Intact  Orientation:  Full (Time, Place, and Person)  Thought Content: Logical   Suicidal Thoughts:  No  Homicidal Thoughts:  No  Memory:  Immediate;   Fair Recent;   Fair Remote;   Fair  Judgement:  Fair  Insight:  Fair  Psychomotor Activity:  Normal  Concentration:  Concentration: Fair and Attention Span: Fair  Recall:  [employer] of Knowledge: Fair  Language: Fair  Akathisia:  No  Handed:  Right  AIMS (if indicated): not done  Assets:  Communication Skills Desire for Improvement Housing Social Support  ADL's:  Intact  Cognition: WNL  Sleep:  Poor due to pain   Screenings: GAD-7    Flowsheet Row Office Visit from 06/10/2022 in [facility] Office Visit from 10/09/2021 in [facility]  Total GAD-7 Score 10 13  Mini-Mental    Flowsheet Row Office Visit from 01/19/2020 in [facility] Office Visit from 12/08/2018 in [facility]  Total Score (max 30 points ) 30 30      PHQ2-9    Flowsheet Row Video Visit from 09/08/2022 in [facility] Office Visit from 07/24/2022 in [facility] Office Visit from 06/10/2022 in [facility] Office Visit from 03/25/2022 in [facility] Clinical Support from 01/28/2022 in [facility]  PHQ-2 Total Score 2 2 3 6 3   PHQ-9 Total Score 5 13 14 22  --      Flowsheet Row Video Visit from 09/08/2022 in [facility] Video Visit from 07/17/2022 in [facility] Office Visit from 06/10/2022 in [facility]  C-SSRS RISK CATEGORY No Risk No Risk No Risk        Assessment and Plan: [name] [name] is a 62 year old Caucasian male, married, on disability, lives in [place] was evaluated by telemedicine today.  Patient with multiple medical problems including morbid obesity, chronic pain, MDD, PTSD, currently with good response to the higher dosage of duloxetine with regards to his mood symptoms, although continues to struggle with sleep mostly because of pain, will benefit from the following plan.  Plan PTSD-improving Duloxetine 50 mg p.o. daily Discussed adding a medication for sleep, patient declines.  Patient to reach out to his pain provider for management of pain which likely of getting sleep. Patient currently on CPAP for OSA, encouraged compliance.  MDD in full remission Duloxetine 50 mg p.o. daily Patient encouraged to establish care with a therapist.  Patient did sign an ROI for [place] to release medical records to his  bariatric provider.  Patient also advised to have them fax over her request for letter if needed for bariatric surgery.  Follow-up in clinic in 6 to 8 weeks or sooner if needed.  In person. Collaboration of Care: Collaboration of Care: Referral or follow-up with counselor/therapist AEB patient encouraged to establish care with therapist.  Patient/Guardian was advised Release of Information must be obtained prior to any record release in order to collaborate their care with an outside provider. Patient/Guardian was advised if they have not already done so to contact the registration department to sign all necessary forms in order for [place] to release information regarding their care.   Consent: Patient/Guardian gives verbal consent for treatment and assignment of benefits for services provided during this visit. Patient/Guardian expressed understanding and agreed to proceed.   This note was generated in part or whole with voice recognition software. Voice recognition is usually quite accurate but there are transcription errors that can and very often do occur. I apologize for any typographical errors that were not detected and corrected.    Jomarie Longs, MD 09/08/2022, 4:20 PM

## 2022-09-09 IMAGING — CR DG SHOULDER 2+V*R*
3 series · 3 of 3 positions shown · non-contrast
Comparison: None Available.

CLINICAL DATA: Fall 2 days ago

EXAM:
RIGHT SHOULDER - 2+ VIEW

[shoulder grashey]
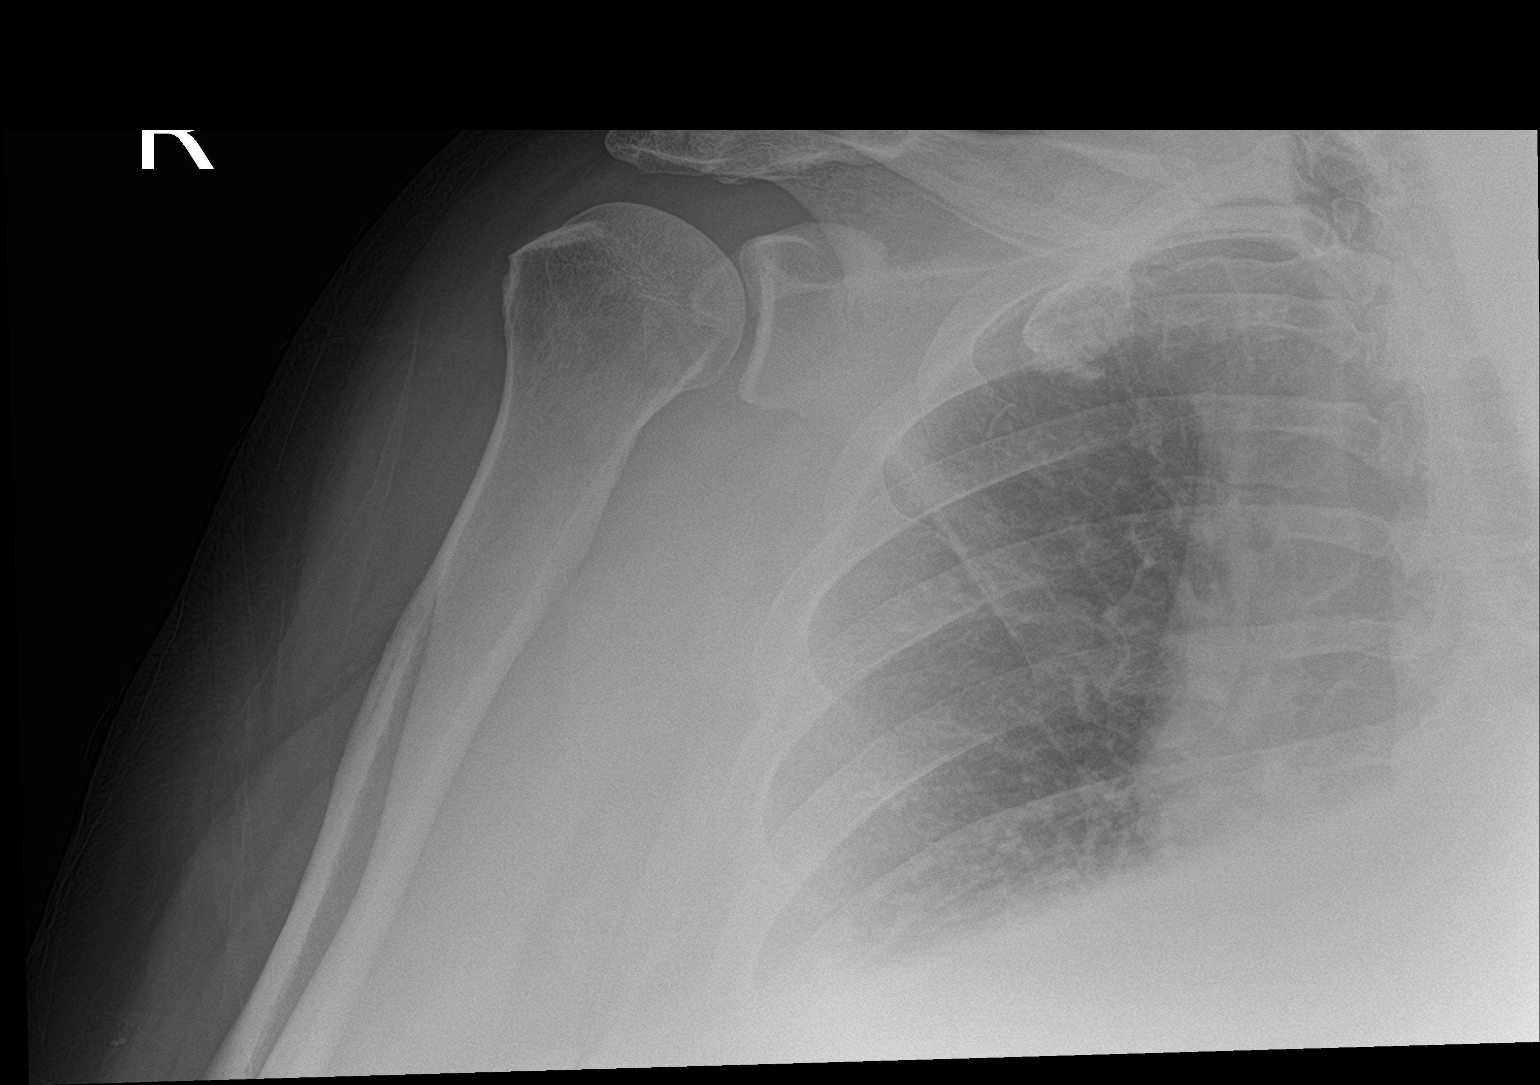

[shoulder y view]
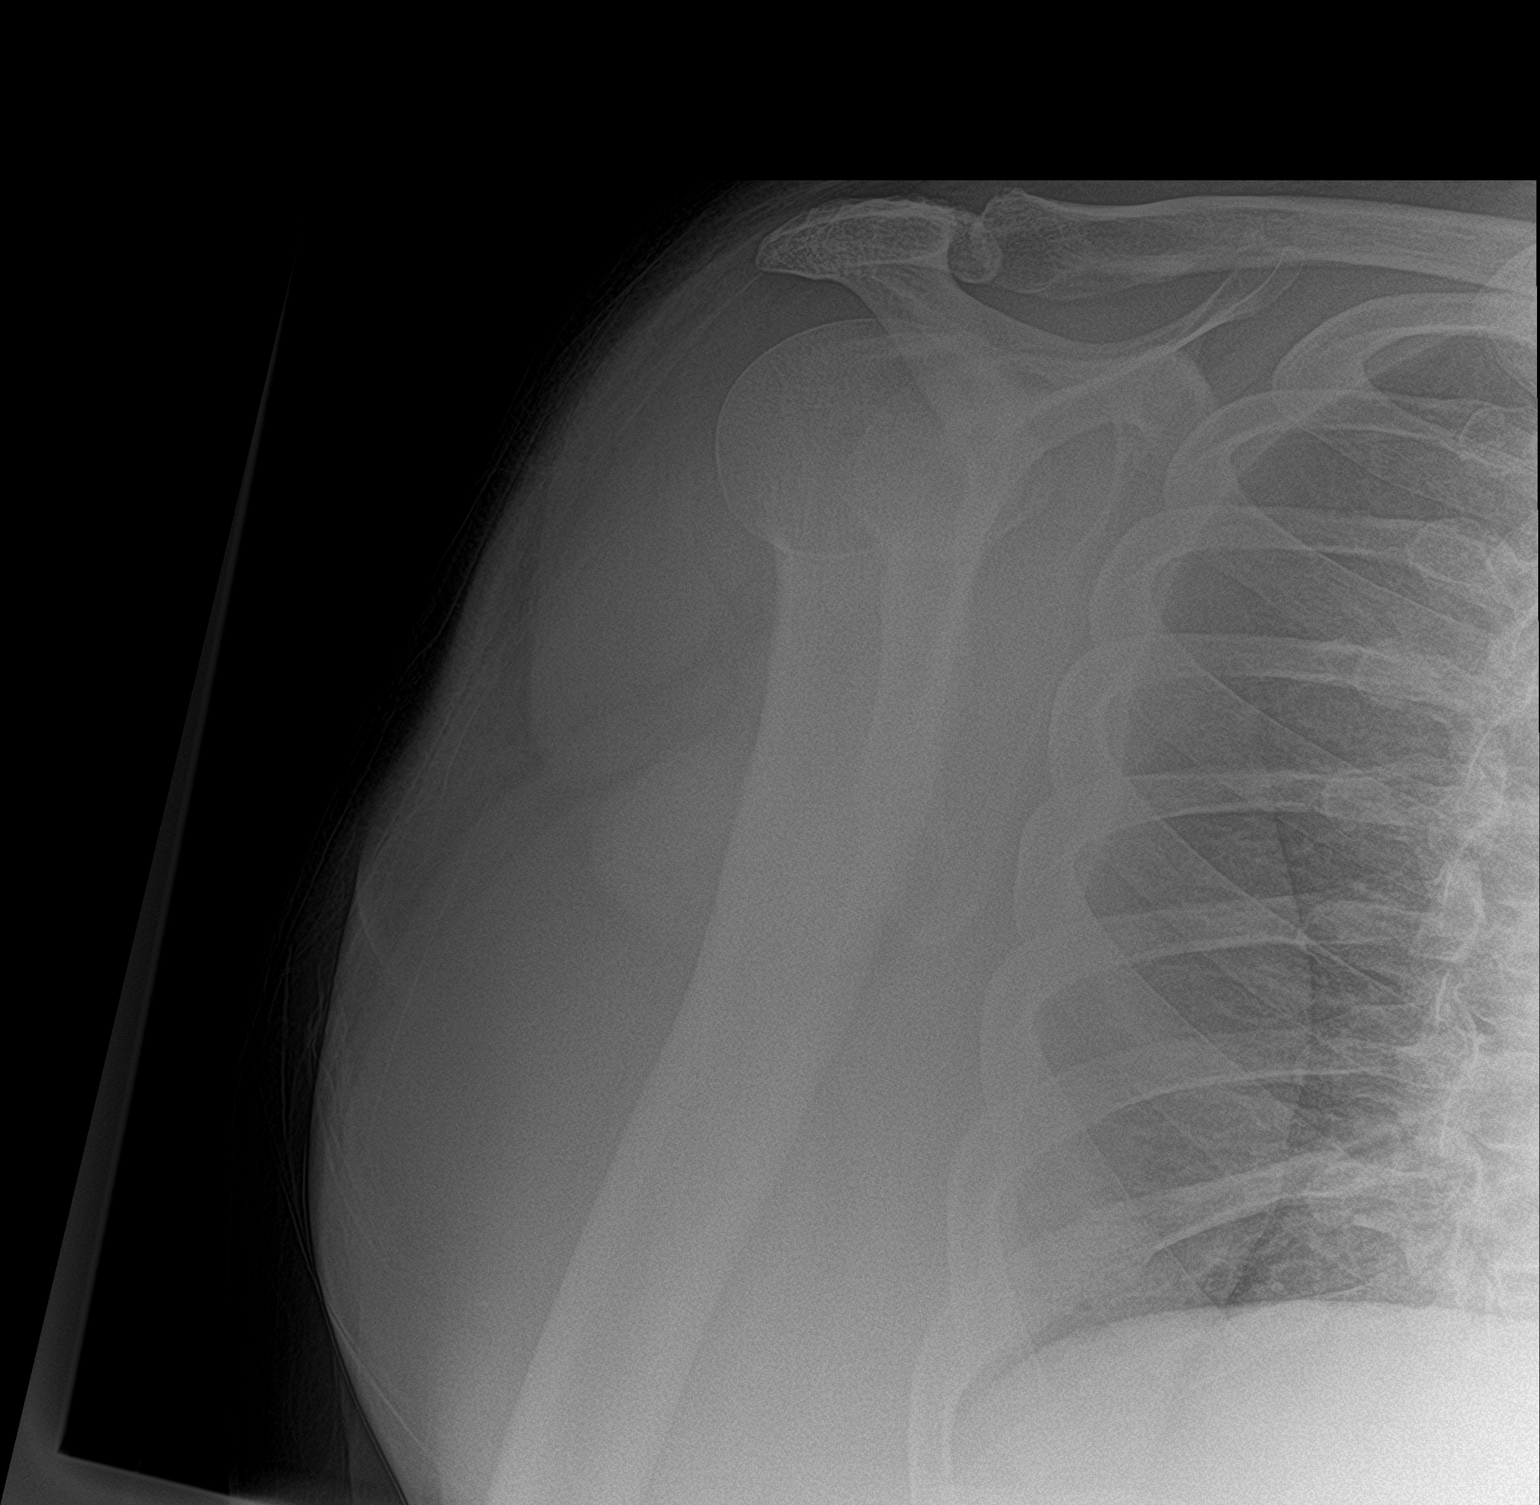

[shoulder ap neutral]
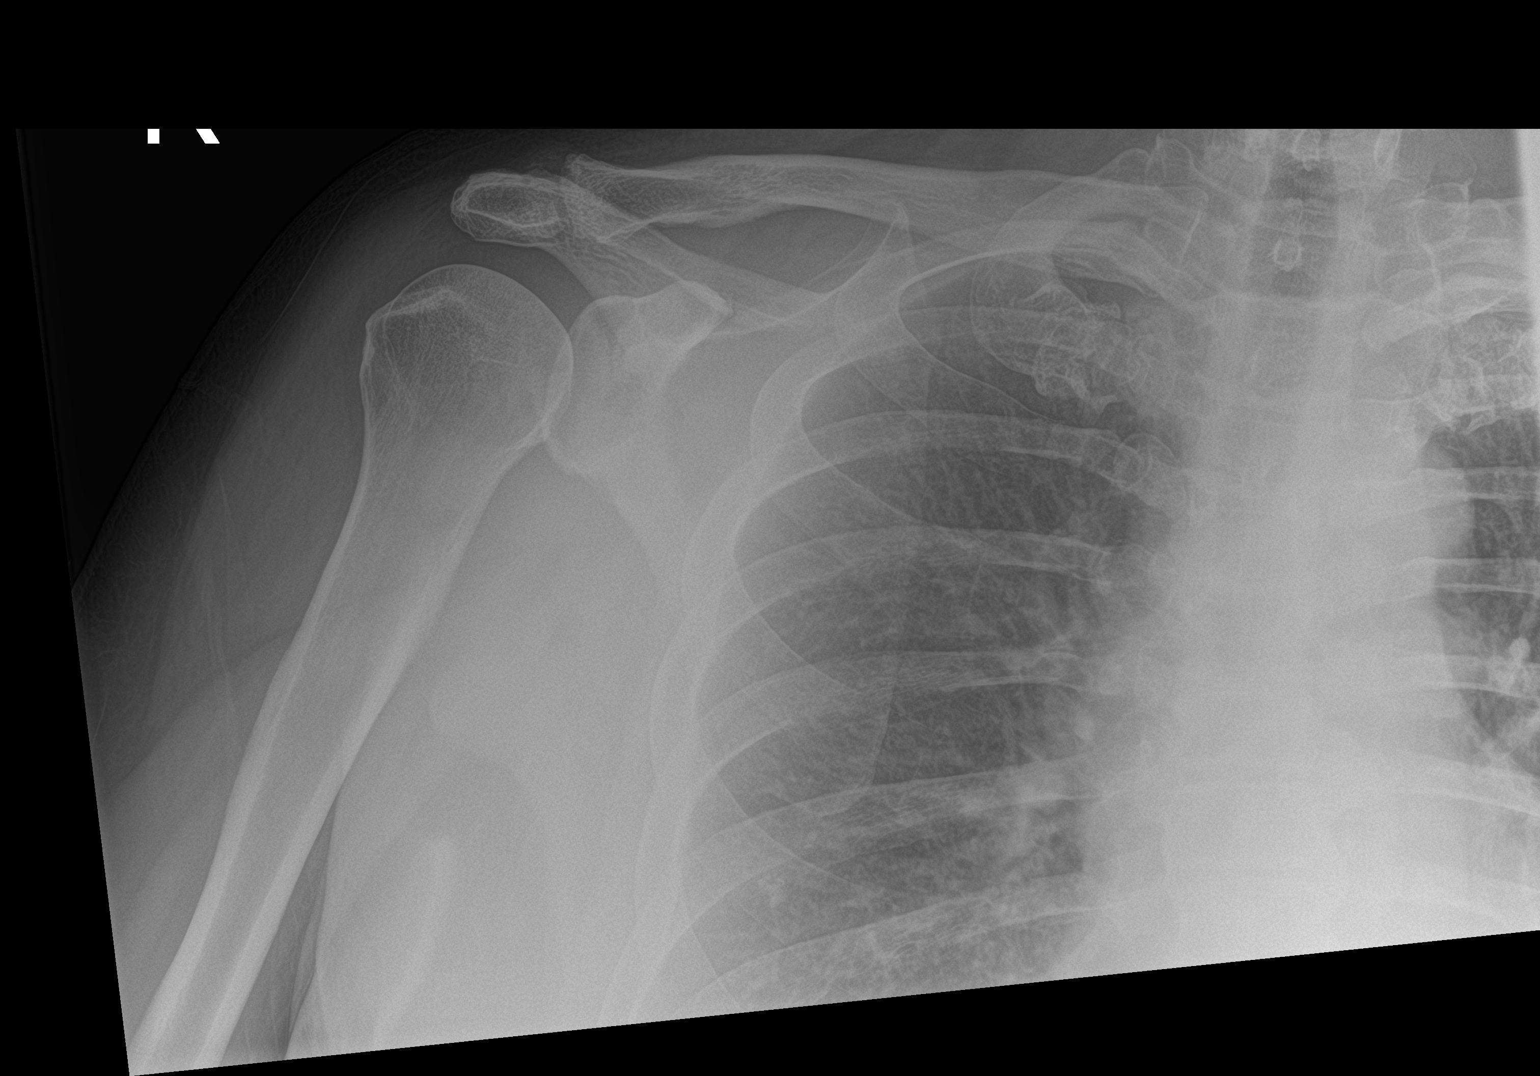

[3 of 3 positions shown; findings below may reference images not displayed]

FINDINGS: Normal alignment no fracture.

Degenerative change in the AC joint with joint space narrowing and
spurring. Shoulder joint intact.
IMPRESSION: Negative for fracture.

## 2022-09-24 DIAGNOSIS — M4800 Spinal stenosis, site unspecified: Secondary | ICD-10-CM | POA: Diagnosis not present

## 2022-09-24 DIAGNOSIS — Z79899 Other long term (current) drug therapy: Secondary | ICD-10-CM | POA: Diagnosis not present

## 2022-09-24 DIAGNOSIS — G8929 Other chronic pain: Secondary | ICD-10-CM | POA: Diagnosis not present

## 2022-09-24 DIAGNOSIS — M5440 Lumbago with sciatica, unspecified side: Secondary | ICD-10-CM | POA: Diagnosis not present

## 2022-09-24 DIAGNOSIS — E559 Vitamin D deficiency, unspecified: Secondary | ICD-10-CM | POA: Diagnosis not present

## 2022-09-29 DIAGNOSIS — Z79899 Other long term (current) drug therapy: Secondary | ICD-10-CM | POA: Diagnosis not present

## 2022-10-06 DIAGNOSIS — G4733 Obstructive sleep apnea (adult) (pediatric): Secondary | ICD-10-CM | POA: Diagnosis not present

## 2022-10-17 ENCOUNTER — Other Ambulatory Visit: Payer: Self-pay | Admitting: Family Medicine

## 2022-10-17 NOTE — Telephone Encounter (Signed)
Requested Prescriptions  Pending Prescriptions Disp Refills   omeprazole (PRILOSEC) 40 MG capsule [Pharmacy Med Name: OMEPRAZOLE 40MG  CAPSULES] 180 capsule 0    Sig: TAKE 1 CAPSULE(40 MG) BY MOUTH TWICE DAILY BEFORE A MEAL     Gastroenterology: Proton Pump Inhibitors Passed - 10/17/2022  3:37 AM      Passed - Valid encounter within last 12 months    Recent Outpatient Visits           2 months ago Morbid obesity [facility])   [facility] Merita Norton T, FNP   5 months ago Lumbar disc disease with radiculopathy   [facility] Merita Norton T, FNP   6 months ago Chronic bilateral low back pain with bilateral sciatica   [facility] Jacky Kindle, FNP   11 months ago Morbid obesity [facility])   [facility] Jacky Kindle, FNP   11 months ago Acute prostatitis   [facility] Caro Laroche, DO       Future Appointments             In 1 month Christell Constant Tyrone Apple, MD [facility]   In 3 months Dunn, Raymon Mutton, PA-C  [facility]

## 2022-10-27 DIAGNOSIS — M5137 Other intervertebral disc degeneration, lumbosacral region: Secondary | ICD-10-CM | POA: Diagnosis not present

## 2022-10-27 DIAGNOSIS — R892 Abnormal level of other drugs, medicaments and biological substances in specimens from other organs, systems and tissues: Secondary | ICD-10-CM | POA: Diagnosis not present

## 2022-10-27 DIAGNOSIS — Z0130 Encounter for examination of blood pressure without abnormal findings: Secondary | ICD-10-CM | POA: Diagnosis not present

## 2022-10-27 DIAGNOSIS — Z8619 Personal history of other infectious and parasitic diseases: Secondary | ICD-10-CM | POA: Diagnosis not present

## 2022-10-27 DIAGNOSIS — Z131 Encounter for screening for diabetes mellitus: Secondary | ICD-10-CM | POA: Diagnosis not present

## 2022-10-27 DIAGNOSIS — R7303 Prediabetes: Secondary | ICD-10-CM | POA: Diagnosis not present

## 2022-10-27 DIAGNOSIS — Z8719 Personal history of other diseases of the digestive system: Secondary | ICD-10-CM | POA: Diagnosis not present

## 2022-10-27 DIAGNOSIS — E039 Hypothyroidism, unspecified: Secondary | ICD-10-CM | POA: Diagnosis not present

## 2022-11-02 DIAGNOSIS — M5137 Other intervertebral disc degeneration, lumbosacral region: Secondary | ICD-10-CM | POA: Diagnosis not present

## 2022-11-02 DIAGNOSIS — Z0130 Encounter for examination of blood pressure without abnormal findings: Secondary | ICD-10-CM | POA: Diagnosis not present

## 2022-11-02 DIAGNOSIS — Z8719 Personal history of other diseases of the digestive system: Secondary | ICD-10-CM | POA: Diagnosis not present

## 2022-11-02 DIAGNOSIS — Z8619 Personal history of other infectious and parasitic diseases: Secondary | ICD-10-CM | POA: Diagnosis not present

## 2022-11-03 DIAGNOSIS — Z79899 Other long term (current) drug therapy: Secondary | ICD-10-CM | POA: Diagnosis not present

## 2022-11-05 ENCOUNTER — Ambulatory Visit: Payer: 59 | Admitting: Psychiatry

## 2022-11-20 ENCOUNTER — Other Ambulatory Visit: Payer: Self-pay | Admitting: Psychiatry

## 2022-11-20 DIAGNOSIS — F3342 Major depressive disorder, recurrent, in full remission: Secondary | ICD-10-CM

## 2022-11-20 DIAGNOSIS — F4310 Post-traumatic stress disorder, unspecified: Secondary | ICD-10-CM

## 2022-11-25 DIAGNOSIS — G4733 Obstructive sleep apnea (adult) (pediatric): Secondary | ICD-10-CM | POA: Diagnosis not present

## 2022-11-26 DIAGNOSIS — Z8719 Personal history of other diseases of the digestive system: Secondary | ICD-10-CM | POA: Diagnosis not present

## 2022-11-26 DIAGNOSIS — Z8619 Personal history of other infectious and parasitic diseases: Secondary | ICD-10-CM | POA: Diagnosis not present

## 2022-11-26 DIAGNOSIS — Z0130 Encounter for examination of blood pressure without abnormal findings: Secondary | ICD-10-CM | POA: Diagnosis not present

## 2022-11-26 DIAGNOSIS — M51379 Other intervertebral disc degeneration, lumbosacral region without mention of lumbar back pain or lower extremity pain: Secondary | ICD-10-CM | POA: Diagnosis not present

## 2022-12-08 ENCOUNTER — Ambulatory Visit: Payer: 59 | Admitting: Orthopedic Surgery

## 2022-12-23 ENCOUNTER — Encounter: Payer: Self-pay | Admitting: Psychiatry

## 2022-12-23 ENCOUNTER — Telehealth: Payer: Self-pay | Admitting: Psychiatry

## 2022-12-23 ENCOUNTER — Ambulatory Visit (INDEPENDENT_AMBULATORY_CARE_PROVIDER_SITE_OTHER): Payer: 59 | Admitting: Psychiatry

## 2022-12-23 VITALS — BP 144/84 | HR 75 | Temp 98.1°F | Ht 72.0 in | Wt 350.4 lb

## 2022-12-23 DIAGNOSIS — F3342 Major depressive disorder, recurrent, in full remission: Secondary | ICD-10-CM

## 2022-12-23 DIAGNOSIS — F4310 Post-traumatic stress disorder, unspecified: Secondary | ICD-10-CM

## 2022-12-23 NOTE — Patient Instructions (Addendum)
Therapist in the area  [email or web address] [address], [place], [place] [address]   [phone]    [facility] [address] [place], [place]IP [address] Phone: [phone]

## 2022-12-23 NOTE — Telephone Encounter (Signed)
Printed out letter for [employer] bariatric solutions per her request, patient to sign an ROI and letter to be faxed.

## 2022-12-23 NOTE — Progress Notes (Signed)
BH MD OP Progress Note  12/23/2022 3:38 PM [name] [name]  MRN:  [id number]  Chief Complaint:  Chief Complaint  Patient presents with   Follow-up   Anxiety   Depression   Medication Refill   HPI: [name] [name] is a 62 year old Caucasian male, married, on disability, lives in [place], has a history of PTSD, MDD, panic attacks, fibromyalgia, degenerative disk disease, carpal tunnel syndrome, morbid obesity was evaluated in office today.  Patient today reports he continues to struggle with morbid obesity.  Patient reports he does struggle with his energy level and motivation because of him being overweight.  Patient however reports he is currently pursuing bariatric surgery to help him to lose weight and is hopeful that will help.  Patient denies any significant depression symptoms at this time.  He is anxious about situational stressors although he is managing it okay.  Current dosage of Cymbalta is beneficial.  Denies side effects.  Patient reports sleep is overall okay.  He does struggle with pain which does affect his sleep on and off.  He is currently on pain management.  Patient denies any PTSD symptoms like intrusive memories, flashbacks.  He has noticed some vivid dreams recently however that does not affect his sleep much.  Patient denies any suicidality, homicidality or perceptual disturbances.  Patient is agreeable to establishing care with a therapist to start CBT given the fact that he is trying to pursue bariatric surgery and may benefit from support from a therapist while he goes through that.  Patient requested a letter be printed out for [facility] clinic supporting his decision.  Patient to sign an ROI for the same.  Visit Diagnosis:    ICD-10-CM   1. PTSD (post-traumatic stress disorder)  F43.10     2. Recurrent major depressive disorder, in full remission (HCC)  F33.42       Past Psychiatric History: I have reviewed past psychiatric history from  progress note on 06/10/2022.  Past trials of medications like Zoloft, BuSpar, trazodone, Paxil, Abilify.  Past Medical History:  Past Medical History:  Diagnosis Date   Acid reflux    Anxiety    Aortic root dilatation (HCC) 12/30/2019   a.) TTE 12/30/2019: measured 39 mm.   Arthritis    Carpal tunnel syndrome, bilateral    Depression    Diastolic dysfunction 12/30/2019   a.) TTE 12/30/2019: norm LV size; mod LVH; EF 55-60% with diastolic dysfunction (details Re: grade not available); norm RV size and function; mod LAE; triv MR/TR; norm AoV.   Diverticulitis    Dyspnea    Fibromyalgia    GERD (gastroesophageal reflux disease)    Glaucoma    Hepatitis C    High cholesterol    History of kidney stones    Hypothyroidism    Juvenile epilepsy (HCC)    OSA on CPAP    Positive TB test     Past Surgical History:  Procedure Laterality Date   COLONOSCOPY WITH PROPOFOL N/A 02/01/2021   Procedure: COLONOSCOPY WITH PROPOFOL;  Surgeon: Regis Bill, MD;  Location: [facility] ENDOSCOPY;  Service: Endoscopy;  Laterality: N/A;   ESOPHAGOGASTRODUODENOSCOPY (EGD) WITH PROPOFOL N/A 02/01/2021   Procedure: ESOPHAGOGASTRODUODENOSCOPY (EGD) WITH PROPOFOL;  Surgeon: Regis Bill, MD;  Location: [facility] ENDOSCOPY;  Service: Endoscopy;  Laterality: N/A;   LIVER BIOPSY      Family Psychiatric History: I have reviewed family psychiatric history from progress note on 06/10/2022.  Family History:  Family History  Problem Relation Age  of Onset   Aneurysm Mother    Cancer Father    Lung cancer Father    Mental illness Sister    Mental illness Sister    Brain cancer Maternal Grandmother    Emphysema Maternal Grandfather    Drug abuse Maternal Uncle    Drug abuse Paternal Aunt     Social History: I have reviewed social history from progress note on 06/10/2022. Social History   Socioeconomic History   Marital status: Married    Spouse name: marice   Number of children: 1   Years of  education: Not on file   Highest education level: Not on file  Occupational History   Not on file  Tobacco Use   Smoking status: Never    Passive exposure: Current   Smokeless tobacco: Never  Vaping Use   Vaping status: Never Used  Substance and Sexual Activity   Alcohol use: Not Currently   Drug use: Yes    Frequency: 2.5 times per week    Types: Marijuana    Comment: occasional   Sexual activity: Not on file  Other Topics Concern   Not on file  Social History Narrative   Not on file   Social Determinants of Health   Financial Resource Strain: Low Risk  (01/28/2022)   Overall Financial Resource Strain (CARDIA)    Difficulty of Paying Living Expenses: Not hard at all  Food Insecurity: No Food Insecurity (01/28/2022)   Hunger Vital Sign    Worried About Running Out of Food in the Last Year: Never true    Ran Out of Food in the Last Year: Never true  Transportation Needs: No Transportation Needs (01/28/2022)   PRAPARE - Administrator, Civil Service (Medical): No    Lack of Transportation (Non-Medical): No  Physical Activity: Insufficiently Active (01/28/2022)   Exercise Vital Sign    Days of Exercise per Week: 2 days    Minutes of Exercise per Session: 20 min  Stress: Stress Concern Present (01/28/2022)   [employer] of Occupational Health - Occupational Stress Questionnaire    Feeling of Stress : To some extent  Social Connections: Unknown (05/21/2022)   [facility] from [facility]   Social Network    Social Network: Not on file    Allergies:  Allergies  Allergen Reactions   Ciprofloxacin     Pt reports joint swelling and pain   Tylenol [Acetaminophen] Other (See Comments)    Was told not to take d/t having hep c treatment   Celecoxib Anxiety   Zoloft [Sertraline Hcl] Anxiety    Metabolic Disorder Labs: Lab Results  Component Value Date   HGBA1C 5.5 03/25/2022   No results found for: "PROLACTIN" Lab Results  Component  Value Date   CHOL 171 03/25/2022   TRIG 205 (H) 03/25/2022   HDL 44 03/25/2022   CHOLHDL 3.9 03/25/2022   LDLCALC 92 03/25/2022   LDLCALC 65 11/08/2021   Lab Results  Component Value Date   TSH 0.684 10/09/2021   TSH 0.405 (L) 06/12/2021    Therapeutic Level Labs: No results found for: "LITHIUM" No results found for: "VALPROATE" No results found for: "CBMZ"  Current Medications: Current Outpatient Medications  Medication Sig Dispense Refill   baclofen (LIORESAL) 10 MG tablet Take 10 mg by mouth 3 (three) times daily as needed.     DULoxetine (CYMBALTA) 20 MG capsule TAKE 1 CAPSULE BY MOUTH EVERY DAY. TAKE ALONG WITH 30 MG 30 capsule 1  DULoxetine (CYMBALTA) 30 MG capsule TAKE 1 CAPSULE BY MOUTH EVERY DAY. TAKE ALONG WITH 20 MG 30 capsule 1   hydrOXYzine (VISTARIL) 25 MG capsule Take 1 capsule (25 mg total) by mouth 2 (two) times daily as needed for anxiety. Please limit use 60 capsule 1   levothyroxine (SYNTHROID) 50 MCG tablet Take 1 tablet (50 mcg total) by mouth daily before breakfast. 90 tablet 3   naloxone (NARCAN) nasal spray 4 mg/0.1 mL SMARTSIG:Both Nares     NON FORMULARY cpap device     omeprazole (PRILOSEC) 40 MG capsule TAKE 1 CAPSULE(40 MG) BY MOUTH TWICE DAILY BEFORE A MEAL 180 capsule 0   oxyCODONE (OXY IR/ROXICODONE) 5 MG immediate release tablet Take 1 tablet (5 mg total) by mouth every 4 (four) hours as needed for severe pain. 30 tablet 0   rosuvastatin (CRESTOR) 10 MG tablet Take 1 tablet (10 mg total) by mouth daily. 90 tablet 3   No current facility-administered medications for this visit.     Musculoskeletal: Strength & Muscle Tone: within normal limits Gait & Station: normal Patient leans: N/A  Psychiatric Specialty Exam: Review of Systems  Psychiatric/Behavioral:  Positive for sleep disturbance. The patient is nervous/anxious.     Blood pressure (!) 144/84, pulse 75, temperature 98.1 F (36.7 C), temperature source Skin, height 6' (1.829 m),  weight (!) 350 lb 6.4 oz (158.9 kg).Body mass index is 47.52 kg/m.  General Appearance: Fairly Groomed  Eye Contact:  Fair  Speech:  Normal Rate  Volume:  Normal  Mood:  Anxious  Affect:  Congruent  Thought Process:  Goal Directed and Descriptions of Associations: Intact  Orientation:  Full (Time, Place, and Person)  Thought Content: Logical   Suicidal Thoughts:  No  Homicidal Thoughts:  No  Memory:  Immediate;   Fair Recent;   Fair Remote;   Fair  Judgement:  Fair  Insight:  Fair  Psychomotor Activity:  Normal  Concentration:  Concentration: Fair and Attention Span: Fair  Recall:  [employer] of Knowledge: Fair  Language: Fair  Akathisia:  No  Handed:  Right  AIMS (if indicated): not done  Assets:  Communication Skills Desire for Improvement Housing Social Support  ADL's:  Intact  Cognition: WNL  Sleep:   Restless due to pain   Screenings: GAD-7    Flowsheet Row Office Visit from 12/23/2022 in [facility] Office Visit from 06/10/2022 in [facility] Office Visit from 10/09/2021 in [facility]  Total GAD-7 Score 6 10 13       Mini-Mental    Flowsheet Row Office Visit from 01/19/2020 in [facility] Office Visit from 12/08/2018 in [facility]  Total Score (max 30 points ) 30 30      PHQ2-9    Flowsheet Row Office Visit from 12/23/2022 in [facility] Video Visit from 09/08/2022 in [facility] Office Visit from 07/24/2022 in [facility] Office Visit from 06/10/2022 in [facility] Office Visit from 03/25/2022 in [facility]  PHQ-2 Total Score 2 2 2 3 6   PHQ-9 Total Score 8 5 13 14 22       Flowsheet Row Office Visit from 12/23/2022 in [facility] Video Visit from 09/08/2022 in [facility] Video Visit from 07/17/2022 in [facility]  C-SSRS RISK CATEGORY No Risk No Risk No Risk        Assessment and Plan: [name] [name] is a 62 year old Caucasian male, married, on disability, lives in [place], with a history of morbid obesity, chronic pain, MDD, PTSD, currently stable on current medication regimen however will benefit from pursuing CBT given the fact that he does have multiple situational stressors as well as he is pursuing bariatric surgery and will benefit from the same.  Plan PTSD-stable Duloxetine 50 mg p.o. daily Patient currently on CPAP for OSA, encouraged compliance Patient to continue pain management.  MDD in remission Duloxetine 50 mg p.o. daily Patient will benefit from CBT-provided resources in the community.  I have reviewed and discussed labs, most recent CMP-within normal limits except for glucose elevated, BUN/creatinine-elevated. Patient will benefit from repeat TSH.  Patient reports he had labs done recently and agrees to sign an ROI to send to [place].  I have printed out a letter supporting his decision for bariatric surgery, answered all the questions, spent 15 minutes for the same.  Follow-up in clinic in 3 months or sooner if needed.   Collaboration of Care: Collaboration of Care: Referral or follow-up with counselor/therapist AEB patient encouraged to establish care with therapist.  Patient/Guardian was advised Release of Information must be obtained prior to any record release in order to collaborate their care with an outside provider. Patient/Guardian was advised if they have not already done so to contact the registration department to sign all necessary forms in order for [place] to release information regarding their care.   Consent: Patient/Guardian gives verbal consent for treatment and assignment of  benefits for services provided during this visit. Patient/Guardian expressed understanding and agreed to proceed.   I have spent atleast 40 minutes face to face with patient today which includes the time spent for preparing to see the patient ( e.g., review of test, records ), obtaining and to review and separately obtained history , ordering medications and test ,psychoeducation and supportive psychotherapy and care coordination,as well as documenting clinical information in electronic health record,interpreting and communication of test results as well as writing a support letter for bariatric surgery, spent 15 minutes for the same..  This note was generated in part or whole with voice recognition software. Voice recognition is usually quite accurate but there are transcription errors that can and very often do occur. I apologize for any typographical errors that were not detected and corrected.     Jomarie Longs, MD 12/24/2022, 10:22 AM

## 2022-12-29 DIAGNOSIS — Z8619 Personal history of other infectious and parasitic diseases: Secondary | ICD-10-CM | POA: Diagnosis not present

## 2022-12-29 DIAGNOSIS — Z789 Other specified health status: Secondary | ICD-10-CM | POA: Diagnosis not present

## 2022-12-29 DIAGNOSIS — R030 Elevated blood-pressure reading, without diagnosis of hypertension: Secondary | ICD-10-CM | POA: Diagnosis not present

## 2022-12-29 DIAGNOSIS — Z8719 Personal history of other diseases of the digestive system: Secondary | ICD-10-CM | POA: Diagnosis not present

## 2022-12-29 DIAGNOSIS — M51379 Other intervertebral disc degeneration, lumbosacral region without mention of lumbar back pain or lower extremity pain: Secondary | ICD-10-CM | POA: Diagnosis not present

## 2022-12-29 DIAGNOSIS — Z0130 Encounter for examination of blood pressure without abnormal findings: Secondary | ICD-10-CM | POA: Diagnosis not present

## 2022-12-29 DIAGNOSIS — Z8639 Personal history of other endocrine, nutritional and metabolic disease: Secondary | ICD-10-CM | POA: Diagnosis not present

## 2022-12-30 DIAGNOSIS — Z01818 Encounter for other preprocedural examination: Secondary | ICD-10-CM | POA: Diagnosis not present

## 2022-12-30 DIAGNOSIS — M4800 Spinal stenosis, site unspecified: Secondary | ICD-10-CM | POA: Diagnosis not present

## 2022-12-30 DIAGNOSIS — G4733 Obstructive sleep apnea (adult) (pediatric): Secondary | ICD-10-CM | POA: Diagnosis not present

## 2022-12-30 DIAGNOSIS — E785 Hyperlipidemia, unspecified: Secondary | ICD-10-CM | POA: Diagnosis not present

## 2022-12-30 DIAGNOSIS — K219 Gastro-esophageal reflux disease without esophagitis: Secondary | ICD-10-CM | POA: Diagnosis not present

## 2022-12-30 DIAGNOSIS — Z1329 Encounter for screening for other suspected endocrine disorder: Secondary | ICD-10-CM | POA: Diagnosis not present

## 2023-01-01 ENCOUNTER — Ambulatory Visit: Payer: 59 | Admitting: Orthopedic Surgery

## 2023-01-19 ENCOUNTER — Other Ambulatory Visit: Payer: Self-pay | Admitting: Family Medicine

## 2023-01-20 ENCOUNTER — Ambulatory Visit (INDEPENDENT_AMBULATORY_CARE_PROVIDER_SITE_OTHER): Payer: 59

## 2023-01-20 VITALS — BP 118/76 | Ht 72.0 in | Wt 334.7 lb

## 2023-01-20 DIAGNOSIS — Z0000 Encounter for general adult medical examination without abnormal findings: Secondary | ICD-10-CM | POA: Diagnosis not present

## 2023-01-20 NOTE — Patient Instructions (Addendum)
[name] [name] , Thank you for taking time to come for your Medicare Wellness Visit. I appreciate your ongoing commitment to your health goals. Please review the following plan we discussed and let me know if I can assist you in the future.   Referrals/Orders/Follow-Ups/Clinician Recommendations: none  This is a list of the screening recommended for you and due dates:  Health Maintenance  Topic Date Due   COVID-19 Vaccine (1) Never done   Zoster (Shingles) Vaccine (1 of 2) Never done   DTaP/Tdap/Td vaccine (2 - Td or Tdap) 12/31/2021   Flu Shot  05/18/2023*   Medicare Annual Wellness Visit  01/20/2024   Colon Cancer Screening  02/02/2024   Hepatitis C Screening  Completed   HIV Screening  Completed   HPV Vaccine  Aged Out  *Topic was postponed. The date shown is not the original due date.    Advanced directives: (ACP Link)Information on Advanced Care Planning can be found at [facility] Directives Advance Health Care Directives ([email or web address]   Next Medicare Annual Wellness Visit scheduled for next year: Yes   01/26/24 @ 10:10 am in person

## 2023-01-20 NOTE — Progress Notes (Signed)
Subjective:   [name] [name] is a 62 y.o. male who presents for Medicare Annual/Subsequent preventive examination.  Visit Complete: In person  Cardiac Risk Factors include: advanced age (>40men, >79 women);obesity (BMI >30kg/m2);dyslipidemia;male gender     Objective:    Today's Vitals   01/20/23 1124 01/20/23 1130  BP: 118/76   Weight: (!) 334 lb 11.2 oz (151.8 kg)   Height: 6' (1.829 m)   PainSc:  3    Body mass index is 45.39 kg/m.     01/20/2023   11:38 AM 01/28/2022    1:41 PM 10/31/2021    3:23 PM 10/26/2021    9:19 PM 07/23/2021    8:21 AM 07/17/2021    4:02 PM 07/09/2021   11:31 AM  Advanced Directives  Does Patient Have a Medical Advance Directive? No No No No No No No  Would patient like information on creating a medical advance directive? No - Patient declined No - Patient declined   No - Patient declined  No - Patient declined    Current Medications (verified) Outpatient Encounter Medications as of 01/20/2023  Medication Sig   DULoxetine (CYMBALTA) 20 MG capsule TAKE 1 CAPSULE BY MOUTH EVERY DAY. TAKE ALONG WITH 30 MG   DULoxetine (CYMBALTA) 30 MG capsule TAKE 1 CAPSULE BY MOUTH EVERY DAY. TAKE ALONG WITH 20 MG   hydrOXYzine (VISTARIL) 25 MG capsule Take 1 capsule (25 mg total) by mouth 2 (two) times daily as needed for anxiety. Please limit use   levothyroxine (SYNTHROID) 50 MCG tablet Take 1 tablet (50 mcg total) by mouth daily before breakfast.   naloxone (NARCAN) nasal spray 4 mg/0.1 mL SMARTSIG:Both Nares   NON FORMULARY cpap device   omeprazole (PRILOSEC) 40 MG capsule TAKE 1 CAPSULE(40 MG) BY MOUTH TWICE DAILY BEFORE A MEAL   oxyCODONE (OXY IR/ROXICODONE) 5 MG immediate release tablet Take 1 tablet (5 mg total) by mouth every 4 (four) hours as needed for severe pain.   rosuvastatin (CRESTOR) 10 MG tablet Take 1 tablet (10 mg total) by mouth daily.   baclofen (LIORESAL) 10 MG tablet Take 10 mg by mouth 3 (three) times daily as needed.   No  facility-administered encounter medications on file as of 01/20/2023.    Allergies (verified) Tramadol, Ciprofloxacin, Tylenol [acetaminophen], Celecoxib, and Zoloft [sertraline hcl]   History: Past Medical History:  Diagnosis Date   Acid reflux    Anxiety    Aortic root dilatation (HCC) 12/30/2019   a.) TTE 12/30/2019: measured 39 mm.   Arthritis    Carpal tunnel syndrome, bilateral    Depression    Diastolic dysfunction 12/30/2019   a.) TTE 12/30/2019: norm LV size; mod LVH; EF 55-60% with diastolic dysfunction (details Re: grade not available); norm RV size and function; mod LAE; triv MR/TR; norm AoV.   Diverticulitis    Dyspnea    Fibromyalgia    GERD (gastroesophageal reflux disease)    Glaucoma    Hepatitis C    High cholesterol    History of kidney stones    Hypothyroidism    Juvenile epilepsy (HCC)    OSA on CPAP    Positive TB test    Past Surgical History:  Procedure Laterality Date   COLONOSCOPY WITH PROPOFOL N/A 02/01/2021   Procedure: COLONOSCOPY WITH PROPOFOL;  Surgeon: Regis Bill, MD;  Location: [facility] ENDOSCOPY;  Service: Endoscopy;  Laterality: N/A;   ESOPHAGOGASTRODUODENOSCOPY (EGD) WITH PROPOFOL N/A 02/01/2021   Procedure: ESOPHAGOGASTRODUODENOSCOPY (EGD) WITH PROPOFOL;  Surgeon: Regis Bill,  MD;  Location: [facility] ENDOSCOPY;  Service: Endoscopy;  Laterality: N/A;   LIVER BIOPSY     Family History  Problem Relation Age of Onset   Aneurysm Mother    Cancer Father    Lung cancer Father    Mental illness Sister    Mental illness Sister    Brain cancer Maternal Grandmother    Emphysema Maternal Grandfather    Drug abuse Maternal Uncle    Drug abuse Paternal Aunt    Social History   Socioeconomic History   Marital status: Married    Spouse name: Engineer, water   Number of children: 1   Years of education: Not on file   Highest education level: Not on file  Occupational History   Not on file  Tobacco Use   Smoking status: Never    Passive  exposure: Current   Smokeless tobacco: Never  Vaping Use   Vaping status: Never Used  Substance and Sexual Activity   Alcohol use: Not Currently   Drug use: Yes    Frequency: 2.5 times per week    Types: Marijuana    Comment: occasional   Sexual activity: Not on file  Other Topics Concern   Not on file  Social History Narrative   Not on file   Social Determinants of Health   Financial Resource Strain: Low Risk  (01/20/2023)   Overall Financial Resource Strain (CARDIA)    Difficulty of Paying Living Expenses: Not hard at all  Food Insecurity: No Food Insecurity (01/20/2023)   Hunger Vital Sign    Worried About Running Out of Food in the Last Year: Never true    Ran Out of Food in the Last Year: Never true  Transportation Needs: No Transportation Needs (01/20/2023)   PRAPARE - Administrator, Civil Service (Medical): No    Lack of Transportation (Non-Medical): No  Recent Concern: Transportation Needs - Unmet Transportation Needs (12/30/2022)   Received from [facility] - Transportation    Lack of Transportation (Medical): Yes    Lack of Transportation (Non-Medical): No  Physical Activity: Insufficiently Active (01/20/2023)   Exercise Vital Sign    Days of Exercise per Week: 3 days    Minutes of Exercise per Session: 30 min  Stress: Stress Concern Present (01/20/2023)   [employer] of Occupational Health - Occupational Stress Questionnaire    Feeling of Stress : To some extent  Social Connections: Moderately Integrated (01/20/2023)   Social Connection and Isolation Panel [NHANES]    Frequency of Communication with Friends and Family: Three times a week    Frequency of Social Gatherings with Friends and Family: Three times a week    Attends Religious Services: More than 4 times per year    Active Member of Clubs or Organizations: No    Attends Banker Meetings: Never    Marital Status: Married    Tobacco Counseling Counseling given:  Not Answered   Clinical Intake:  Pre-visit preparation completed: Yes  Pain : 0-10 Pain Score: 3  Pain Type: Chronic pain Pain Location: Back Pain Orientation: Lower Pain Descriptors / Indicators: Aching, Tightness, Constant Pain Onset: More than a month ago Pain Frequency: Constant     BMI - recorded: 45.39 Nutritional Status: BMI > 30  Obese Nutritional Risks: None Diabetes: No  How often do you need to have someone help you when you read instructions, pamphlets, or other written materials from your doctor or pharmacy?: 1 - Never  Interpreter  Needed?: No  Information entered by :: Kennedy Bucker, LPN   Activities of Daily Living    01/20/2023   11:39 AM 07/24/2022    1:22 PM  In your present state of health, do you have any difficulty performing the following activities:  Hearing? 0 0  Vision? 0 0  Difficulty concentrating or making decisions? 0 0  Walking or climbing stairs? 0 1  Dressing or bathing? 0 0  Doing errands, shopping? 1 0  Preparing Food and eating ? N   Using the Toilet? N   In the past six months, have you accidently leaked urine? N   Do you have problems with loss of bowel control? N   Managing your Medications? Y   Managing your Finances? N   Housekeeping or managing your Housekeeping? N     Patient Care Team: Jacky Kindle, FNP as PCP - General (Family Medicine) End, Cristal Deer, MD as PCP - Cardiology (Cardiology) Monika Salk, [facility] (Inactive) as Pharmacist (Pharmacist)  Indicate any recent Medical Services you may have received from other than [facility] providers in the past year (date may be approximate).     Assessment:   This is a routine wellness examination for [employer].  Hearing/Vision screen Hearing Screening - Comments:: No aids Vision Screening - Comments:: Wears glasses- [facility]   Goals Addressed             This Visit's Progress    DIET - INCREASE WATER INTAKE         Depression Screen    01/20/2023   11:36  AM 12/23/2022    3:44 PM 09/08/2022    1:26 PM 07/24/2022    1:21 PM 06/10/2022    3:15 PM 03/25/2022    1:21 PM 01/28/2022    1:38 PM  PHQ 2/9 Scores  PHQ - 2 Score 2   2  6 3   PHQ- 9 Score 7   13  22       Information is confidential and restricted. Go to Review Flowsheets to unlock data.    Fall Risk    01/20/2023   11:38 AM 07/24/2022    1:21 PM 03/25/2022    1:21 PM 03/24/2022    1:47 PM 01/28/2022    1:42 PM  Fall Risk   Falls in the past year? 1 1 1  0 1  Number falls in past yr: 1 1 1   0  Injury with Fall? 0 0 1  0  Risk for fall due to : History of fall(s);Impaired balance/gait    History of fall(s)  Follow up Falls evaluation completed;Falls prevention discussed    Falls evaluation completed;Falls prevention discussed    MEDICARE RISK AT HOME:    TIMED UP AND GO:  Was the test performed?  Yes  Length of time to ambulate 10 feet: 4 sec Gait steady and fast without use of assistive device    Cognitive Function:    01/19/2020    3:33 PM 12/18/2018    4:54 PM  MMSE - Mini Mental State Exam  Orientation to time 5 5  Orientation to Place 5 5  Registration 3 3  Attention/ Calculation 5 5  Recall 3 3  Language- name 2 objects 2 2  Language- repeat 1 1  Language- follow 3 step command 3 3  Language- read & follow direction 1 1  Write a sentence 1 1  Copy design 1 1  Total score 30 30        01/28/2022  1:47 PM  6CIT Screen  What Year? 0 points  What month? 0 points  What time? 0 points  Count back from 20 0 points  Months in reverse 0 points  Repeat phrase 2 points  Total Score 2 points    Immunizations Immunization History  Administered Date(s) Administered   Fluad Trivalent(High Dose 65+) 01/01/2012   Fluzone Influenza virus vaccine,trivalent (IIV3), split virus 02/22/2013   Influenza Inj Mdck Quad Pf 11/07/2014, 12/06/2019   Influenza,inj,Quad PF,6+ Mos 11/16/2015, 11/23/2017   Influenza,inj,Quad PF,6-35 Mos 02/13/2017   Influenza-Unspecified  01/28/2011   Tdap 01/01/2012    TDAP status: Due, Education has been provided regarding the importance of this vaccine. Advised may receive this vaccine at local pharmacy or Health Dept. Aware to provide a copy of the vaccination record if obtained from local pharmacy or Health Dept. Verbalized acceptance and understanding.  Flu Vaccine status: Declined, Education has been provided regarding the importance of this vaccine but patient still declined. Advised may receive this vaccine at local pharmacy or Health Dept. Aware to provide a copy of the vaccination record if obtained from local pharmacy or Health Dept. Verbalized acceptance and understanding.  Pneumococcal vaccine status: Declined,  Education has been provided regarding the importance of this vaccine but patient still declined. Advised may receive this vaccine at local pharmacy or Health Dept. Aware to provide a copy of the vaccination record if obtained from local pharmacy or Health Dept. Verbalized acceptance and understanding.   Covid-19 vaccine status: Declined, Education has been provided regarding the importance of this vaccine but patient still declined. Advised may receive this vaccine at local pharmacy or Health Dept.or vaccine clinic. Aware to provide a copy of the vaccination record if obtained from local pharmacy or Health Dept. Verbalized acceptance and understanding.  Qualifies for Shingles Vaccine? Yes   Zostavax completed No   Shingrix Completed?: No.    Education has been provided regarding the importance of this vaccine. Patient has been advised to call insurance company to determine out of pocket expense if they have not yet received this vaccine. Advised may also receive vaccine at local pharmacy or Health Dept. Verbalized acceptance and understanding.  Screening Tests Health Maintenance  Topic Date Due   COVID-19 Vaccine (1) Never done   Zoster Vaccines- Shingrix (1 of 2) Never done   DTaP/Tdap/Td (2 - Td or Tdap)  12/31/2021   INFLUENZA VACCINE  09/18/2022   Medicare Annual Wellness (AWV)  01/20/2024   Colonoscopy  02/02/2024   Hepatitis C Screening  Completed   HIV Screening  Completed   HPV VACCINES  Aged Out    Health Maintenance  Health Maintenance Due  Topic Date Due   COVID-19 Vaccine (1) Never done   Zoster Vaccines- Shingrix (1 of 2) Never done   DTaP/Tdap/Td (2 - Td or Tdap) 12/31/2021   INFLUENZA VACCINE  09/18/2022    Colorectal cancer screening: Type of screening: Colonoscopy. Completed 1216/22. Repeat every 3 years  Lung Cancer Screening: (Low Dose CT Chest recommended if Age 43-80 years, 20 pack-year currently smoking OR have quit w/in 15years.) does not qualify.    Additional Screening:  Hepatitis C Screening: does qualify; Completed 06/12/21  Vision Screening: Recommended annual ophthalmology exams for early detection of glaucoma and other disorders of the eye. Is the patient up to date with their annual eye exam?  Yes  Who is the provider or what is the name of the office in which the patient attends annual eye exams? [facility]  If pt is not established with a provider, would they like to be referred to a provider to establish care? No .   Dental Screening: Recommended annual dental exams for proper oral hygiene   Community Resource Referral / Chronic Care Management: CRR required this visit?  No   CCM required this visit?  No     Plan:     I have personally reviewed and noted the following in the patient's chart:   Medical and social history Use of alcohol, tobacco or illicit drugs  Current medications and supplements including opioid prescriptions. Patient is currently taking opioid prescriptions. Information provided to patient regarding non-opioid alternatives. Patient advised to discuss non-opioid treatment plan with their provider. Functional ability and status Nutritional status Physical activity Advanced directives List of other  physicians Hospitalizations, surgeries, and ER visits in previous 12 months Vitals Screenings to include cognitive, depression, and falls Referrals and appointments  In addition, I have reviewed and discussed with patient certain preventive protocols, quality metrics, and best practice recommendations. A written personalized care plan for preventive services as well as general preventive health recommendations were provided to patient.     Hal Hope, LPN   16/02/958   After Visit Summary: (In Person-Declined) Patient declined AVS at this time.-on mychart  Nurse Notes: none

## 2023-01-22 DIAGNOSIS — Z1329 Encounter for screening for other suspected endocrine disorder: Secondary | ICD-10-CM | POA: Diagnosis not present

## 2023-01-22 DIAGNOSIS — E785 Hyperlipidemia, unspecified: Secondary | ICD-10-CM | POA: Diagnosis not present

## 2023-01-22 DIAGNOSIS — K219 Gastro-esophageal reflux disease without esophagitis: Secondary | ICD-10-CM | POA: Diagnosis not present

## 2023-01-22 DIAGNOSIS — M4800 Spinal stenosis, site unspecified: Secondary | ICD-10-CM | POA: Diagnosis not present

## 2023-01-22 DIAGNOSIS — Z79899 Other long term (current) drug therapy: Secondary | ICD-10-CM | POA: Diagnosis not present

## 2023-01-22 DIAGNOSIS — M549 Dorsalgia, unspecified: Secondary | ICD-10-CM | POA: Diagnosis not present

## 2023-01-22 DIAGNOSIS — G4733 Obstructive sleep apnea (adult) (pediatric): Secondary | ICD-10-CM | POA: Diagnosis not present

## 2023-01-22 DIAGNOSIS — E079 Disorder of thyroid, unspecified: Secondary | ICD-10-CM | POA: Diagnosis not present

## 2023-01-22 DIAGNOSIS — R5383 Other fatigue: Secondary | ICD-10-CM | POA: Diagnosis not present

## 2023-01-22 HISTORY — PX: OTHER SURGICAL HISTORY: SHX169

## 2023-02-10 DIAGNOSIS — Z8619 Personal history of other infectious and parasitic diseases: Secondary | ICD-10-CM | POA: Diagnosis not present

## 2023-02-10 DIAGNOSIS — Z903 Acquired absence of stomach [part of]: Secondary | ICD-10-CM | POA: Diagnosis not present

## 2023-02-10 DIAGNOSIS — M51379 Other intervertebral disc degeneration, lumbosacral region without mention of lumbar back pain or lower extremity pain: Secondary | ICD-10-CM | POA: Diagnosis not present

## 2023-02-10 DIAGNOSIS — Z8639 Personal history of other endocrine, nutritional and metabolic disease: Secondary | ICD-10-CM | POA: Diagnosis not present

## 2023-02-10 DIAGNOSIS — R030 Elevated blood-pressure reading, without diagnosis of hypertension: Secondary | ICD-10-CM | POA: Diagnosis not present

## 2023-02-10 DIAGNOSIS — Z8719 Personal history of other diseases of the digestive system: Secondary | ICD-10-CM | POA: Diagnosis not present

## 2023-02-10 DIAGNOSIS — Z789 Other specified health status: Secondary | ICD-10-CM | POA: Diagnosis not present

## 2023-02-10 DIAGNOSIS — Z0130 Encounter for examination of blood pressure without abnormal findings: Secondary | ICD-10-CM | POA: Diagnosis not present

## 2023-02-13 ENCOUNTER — Ambulatory Visit: Payer: 59 | Admitting: Physician Assistant

## 2023-02-16 DIAGNOSIS — Z8619 Personal history of other infectious and parasitic diseases: Secondary | ICD-10-CM | POA: Diagnosis not present

## 2023-02-16 DIAGNOSIS — Z0189 Encounter for other specified special examinations: Secondary | ICD-10-CM | POA: Diagnosis not present

## 2023-02-16 DIAGNOSIS — E039 Hypothyroidism, unspecified: Secondary | ICD-10-CM | POA: Diagnosis not present

## 2023-02-16 DIAGNOSIS — Z7901 Long term (current) use of anticoagulants: Secondary | ICD-10-CM | POA: Diagnosis not present

## 2023-02-16 DIAGNOSIS — R7401 Elevation of levels of liver transaminase levels: Secondary | ICD-10-CM | POA: Diagnosis not present

## 2023-02-16 DIAGNOSIS — K7402 Hepatic fibrosis, advanced fibrosis: Secondary | ICD-10-CM | POA: Diagnosis not present

## 2023-02-27 DIAGNOSIS — G4733 Obstructive sleep apnea (adult) (pediatric): Secondary | ICD-10-CM | POA: Diagnosis not present

## 2023-03-03 DIAGNOSIS — K7402 Hepatic fibrosis, advanced fibrosis: Secondary | ICD-10-CM | POA: Diagnosis not present

## 2023-03-11 NOTE — Progress Notes (Deleted)
Cardiology Office Note    Date:  03/11/2023   ID:  [name] [name], DOB 02-20-1960, MRN [id number]  PCP:  Sherlyn Hay, DO  Cardiologist:  Yvonne Kendall, MD  Electrophysiologist:  None   Chief Complaint: Follow-up  History of Present Illness:   [name] [name] is a 63 y.o. male with history of diastolic dysfunction, HLD, hepatitis C, seizure disorder in adolescence, hypothyroidism, OSA on CPAP, diverticulitis, depression, anxiety, obesity, and GERD who presents for follow-up of diastolic dysfunction.  Prior echo 12/2019 showed an EF of 55 to 60%, moderate LVH, and diastolic dysfunction.  He was first evaluated by Dr. Okey Dupre in 06/2021 for preoperative cardiac risk stratification, at which time he was without symptoms of angina or cardiac decompensation with NYHA class II symptoms.  In follow-up in 05/2022 he continued to note exertional dyspnea, though not as severe as what he experienced in 2021.  Subsequent echo in 06/2022 showed an EF of 60 to 65%, no regional wall motion abnormalities, mildly dilated LV internal cavity size, grade 1 diastolic dysfunction, normal RV systolic function and ventricular cavity size, moderately dilated left atrium, borderline dilatation of the aortic root and ascending aorta measuring 37 mm each, and an estimated right atrial pressure of 3 mmHg.  He was most recently seen in the office in 08/2022 indicating his shortness of breath resolved following increase in exercise and starting water aerobics.  He was without symptoms of angina or cardiac decompensation.  No changes in cardiac medications were indicated at that time.  Since we last saw him, he has undergone a laparoscopic gastric bypass in 01/2023 without cardiac complication.  He followed up with bariatric surgery on 03/02/2023 with a weight loss of 23 pounds following gastric bypass.  ***   Labs independently reviewed: 03/2022 - Hgb 15.7, PLT 180, potassium 4.0, BUN 21, serum creatinine 0.8, albumin 4.2, AST/ALT  normal, TC 171, TG 205, HDL 44, LDL 92, A1c 5.5 09/2021 - TSH normal  Past Medical History:  Diagnosis Date   Acid reflux    Anxiety    Aortic root dilatation (HCC) 12/30/2019   a.) TTE 12/30/2019: measured 39 mm.   Arthritis    Carpal tunnel syndrome, bilateral    Depression    Diastolic dysfunction 12/30/2019   a.) TTE 12/30/2019: norm LV size; mod LVH; EF 55-60% with diastolic dysfunction (details Re: grade not available); norm RV size and function; mod LAE; triv MR/TR; norm AoV.   Diverticulitis    Dyspnea    Fibromyalgia    GERD (gastroesophageal reflux disease)    Glaucoma    Hepatitis C    High cholesterol    History of kidney stones    Hypothyroidism    Juvenile epilepsy (HCC)    OSA on CPAP    Positive TB test     Past Surgical History:  Procedure Laterality Date   COLONOSCOPY WITH PROPOFOL N/A 02/01/2021   Procedure: COLONOSCOPY WITH PROPOFOL;  Surgeon: Regis Bill, MD;  Location: [facility] ENDOSCOPY;  Service: Endoscopy;  Laterality: N/A;   ESOPHAGOGASTRODUODENOSCOPY (EGD) WITH PROPOFOL N/A 02/01/2021   Procedure: ESOPHAGOGASTRODUODENOSCOPY (EGD) WITH PROPOFOL;  Surgeon: Regis Bill, MD;  Location: [facility] ENDOSCOPY;  Service: Endoscopy;  Laterality: N/A;   LIVER BIOPSY      Current Medications: No outpatient medications have been marked as taking for the 03/12/23 encounter (Appointment) with Sondra Barges, PA-C.    Allergies:   Tramadol, Ciprofloxacin, Tylenol [acetaminophen], Celecoxib, and Zoloft [sertraline hcl]   Social History  Socioeconomic History   Marital status: Married    Spouse name: marice   Number of children: 1   Years of education: Not on file   Highest education level: Not on file  Occupational History   Not on file  Tobacco Use   Smoking status: Never    Passive exposure: Current   Smokeless tobacco: Never  Vaping Use   Vaping status: Never Used  Substance and Sexual Activity   Alcohol use: Not Currently   Drug use: Yes     Frequency: 2.5 times per week    Types: Marijuana    Comment: occasional   Sexual activity: Not on file  Other Topics Concern   Not on file  Social History Narrative   Not on file   Social Drivers of Health   Financial Resource Strain: Low Risk  (01/20/2023)   Overall Financial Resource Strain (CARDIA)    Difficulty of Paying Living Expenses: Not hard at all  Food Insecurity: No Food Insecurity (01/20/2023)   Hunger Vital Sign    Worried About Running Out of Food in the Last Year: Never true    Ran Out of Food in the Last Year: Never true  Transportation Needs: No Transportation Needs (01/20/2023)   PRAPARE - Administrator, Civil Service (Medical): No    Lack of Transportation (Non-Medical): No  Recent Concern: Transportation Needs - Unmet Transportation Needs (12/30/2022)   Received from [facility] - [facility]    Lack of Transportation (Medical): Yes    Lack of Transportation (Non-Medical): No  Physical Activity: Insufficiently Active (01/20/2023)   Exercise Vital Sign    Days of Exercise per Week: 3 days    Minutes of Exercise per Session: 30 min  Stress: No Stress Concern Present (01/22/2023)   Received from [facility] of Occupational Health - Occupational Stress Questionnaire    Feeling of Stress : Not at all  Recent Concern: Stress - Stress Concern Present (01/20/2023)   [employer] of Occupational Health - Occupational Stress Questionnaire    Feeling of Stress : To some extent  Social Connections: Moderately Integrated (01/20/2023)   Social Connection and Isolation Panel [NHANES]    Frequency of Communication with Friends and Family: Three times a week    Frequency of Social Gatherings with Friends and Family: Three times a week    Attends Religious Services: More than 4 times per year    Active Member of Clubs or Organizations: No    Attends Banker Meetings: Never    Marital Status: Married      Family History:  The patient's family history includes Aneurysm in his mother; Brain cancer in his maternal grandmother; Cancer in his father; Drug abuse in his maternal uncle and paternal aunt; Emphysema in his maternal grandfather; Lung cancer in his father; Mental illness in his sister and sister.  ROS:   12-point review of systems is negative unless otherwise noted in the HPI.   EKGs/Labs/Other Studies Reviewed:    Studies reviewed were summarized above. The additional studies were reviewed today:  2D echo 07/04/2022: 1. Left ventricular ejection fraction, by estimation, is 60 to 65%. The  left ventricle has normal function. The left ventricle has no regional  wall motion abnormalities. The left ventricular internal cavity size was  mildly dilated. Left ventricular  diastolic parameters are consistent with Grade I diastolic dysfunction  (impaired relaxation). The average left ventricular global longitudinal  strain is -18.2 %.  2. Right ventricular systolic function is normal. The right ventricular  size is normal. Tricuspid regurgitation signal is inadequate for assessing  PA pressure.   3. Left atrial size was moderately dilated.   4. The mitral valve is normal in structure. No evidence of mitral valve  regurgitation. No evidence of mitral stenosis.   5. The aortic valve has an indeterminant number of cusps. Aortic valve  regurgitation is not visualized. No aortic stenosis is present.   6. There is borderline dilatation of the aortic root, measuring 37 mm.  There is borderline dilatation of the ascending aorta, measuring 37 mm.   7. The inferior vena cava is normal in size with greater than 50%  respiratory variability, suggesting right atrial pressure of 3 mmHg.   EKG:  EKG is ordered today.  The EKG ordered today demonstrates ***  Recent Labs: 04/08/2022: ALT 23; BUN 21; Creatinine, Ser 0.80; Hemoglobin 15.7; Platelets 180; Potassium 4.0; Sodium 137  Recent Lipid  Panel    Component Value Date/Time   CHOL 171 03/25/2022 1358   TRIG 205 (H) 03/25/2022 1358   HDL 44 03/25/2022 1358   CHOLHDL 3.9 03/25/2022 1358   LDLCALC 92 03/25/2022 1358    PHYSICAL EXAM:    VS:  There were no vitals taken for this visit.  BMI: There is no height or weight on file to calculate BMI.  Physical Exam  Wt Readings from Last 3 Encounters:  01/20/23 (!) 334 lb 11.2 oz (151.8 kg)  08/19/22 (!) 338 lb 6.4 oz (153.5 kg)  07/24/22 (!) 337 lb (152.9 kg)     ASSESSMENT & PLAN:   Diastolic dysfunction:  Palpitations:  HLD: LDL 92 in 03/2022 with normal AST/ALT at that time.  Obesity with OSA: Status post gastric bypass in 01/2023.   {Are you ordering a CV Procedure (e.g. stress test, cath, DCCV, TEE, etc)?   Press F2        :[id number]}     Disposition: F/u with Dr. Okey Dupre or an APP in ***.   Medication Adjustments/Labs and Tests Ordered: Current medicines are reviewed at length with the patient today.  Concerns regarding medicines are outlined above. Medication changes, Labs and Tests ordered today are summarized above and listed in the Patient Instructions accessible in Encounters.   Signed, Eula Listen, PA-C 03/11/2023 7:55 AM     [facility] [address] [place], [place] [address] [phone]

## 2023-03-12 ENCOUNTER — Ambulatory Visit: Payer: 59 | Attending: Physician Assistant | Admitting: Physician Assistant

## 2023-03-13 DIAGNOSIS — M51379 Other intervertebral disc degeneration, lumbosacral region without mention of lumbar back pain or lower extremity pain: Secondary | ICD-10-CM | POA: Diagnosis not present

## 2023-03-13 DIAGNOSIS — Z789 Other specified health status: Secondary | ICD-10-CM | POA: Diagnosis not present

## 2023-03-13 DIAGNOSIS — Z8619 Personal history of other infectious and parasitic diseases: Secondary | ICD-10-CM | POA: Diagnosis not present

## 2023-03-13 DIAGNOSIS — R030 Elevated blood-pressure reading, without diagnosis of hypertension: Secondary | ICD-10-CM | POA: Diagnosis not present

## 2023-03-13 DIAGNOSIS — Z0130 Encounter for examination of blood pressure without abnormal findings: Secondary | ICD-10-CM | POA: Diagnosis not present

## 2023-03-13 DIAGNOSIS — R7401 Elevation of levels of liver transaminase levels: Secondary | ICD-10-CM | POA: Diagnosis not present

## 2023-03-13 DIAGNOSIS — Z8639 Personal history of other endocrine, nutritional and metabolic disease: Secondary | ICD-10-CM | POA: Diagnosis not present

## 2023-03-13 DIAGNOSIS — Z903 Acquired absence of stomach [part of]: Secondary | ICD-10-CM | POA: Diagnosis not present

## 2023-03-25 ENCOUNTER — Ambulatory Visit: Payer: Self-pay | Admitting: Psychiatry

## 2023-04-03 ENCOUNTER — Ambulatory Visit: Payer: Self-pay

## 2023-04-03 DIAGNOSIS — Z03818 Encounter for observation for suspected exposure to other biological agents ruled out: Secondary | ICD-10-CM | POA: Diagnosis not present

## 2023-04-03 DIAGNOSIS — R051 Acute cough: Secondary | ICD-10-CM | POA: Diagnosis not present

## 2023-04-03 DIAGNOSIS — I5032 Chronic diastolic (congestive) heart failure: Secondary | ICD-10-CM | POA: Diagnosis not present

## 2023-04-03 DIAGNOSIS — J22 Unspecified acute lower respiratory infection: Secondary | ICD-10-CM | POA: Diagnosis not present

## 2023-04-03 NOTE — Telephone Encounter (Signed)
Message from [place] P sent at 04/03/2023  9:59 AM EST  Summary: possible flu   Possible flu/  no appts// [phone]         Chief Complaint:   pt's wfe had called about poss. Flu. Called wife x 3 attempts and on 3rd attempt she answered. She stated she took her husband to UC and he was dx with Pna.   Disposition: [] ED /[] Urgent Care (no appt availability in office) / [] Appointment(In office/virtual)/ []  [facility]/ [x] Home Care/ [] Refused Recommended Disposition /[] [place] [facility]/ []  Follow-up with PCP Additional Notes: advised to get a humidifier, for pt to drink at least 6- 8 glasses of water and to incorporate warm beverages to open airway. Advised if worsens to go to UC or ED  Reason for Disposition  [1] Follow-up call to recent contact AND [2] information only call, no triage required  Answer Assessment - Initial Assessment Questions 1. REASON FOR CALL or QUESTION: "What is your reason for calling today?" or "How can I best help you?" or "What question do you have that I can help answer?"     Had called about poss. Flu. Called wife x 3 attempts and on 3rd attempt she answered. She stated she took her husband to UC and he was dx with Pna.  Protocols used: Information Only Call - No Triage-A-AH

## 2023-04-03 NOTE — Telephone Encounter (Signed)
Message from [place] P sent at 04/03/2023  9:59 AM EST  Summary: possible flu   Possible flu/  no appts// [phone]        Called pt and LM on VM to CB.

## 2023-04-03 NOTE — Telephone Encounter (Signed)
Summary: possible flu   Possible flu/  no appts// [phone]    Called pt left message on machine to return our call.

## 2023-04-13 DIAGNOSIS — R030 Elevated blood-pressure reading, without diagnosis of hypertension: Secondary | ICD-10-CM | POA: Diagnosis not present

## 2023-04-13 DIAGNOSIS — R7401 Elevation of levels of liver transaminase levels: Secondary | ICD-10-CM | POA: Diagnosis not present

## 2023-04-13 DIAGNOSIS — Z789 Other specified health status: Secondary | ICD-10-CM | POA: Diagnosis not present

## 2023-04-13 DIAGNOSIS — Z0130 Encounter for examination of blood pressure without abnormal findings: Secondary | ICD-10-CM | POA: Diagnosis not present

## 2023-04-13 DIAGNOSIS — M51379 Other intervertebral disc degeneration, lumbosacral region without mention of lumbar back pain or lower extremity pain: Secondary | ICD-10-CM | POA: Diagnosis not present

## 2023-04-13 DIAGNOSIS — Z8639 Personal history of other endocrine, nutritional and metabolic disease: Secondary | ICD-10-CM | POA: Diagnosis not present

## 2023-04-13 DIAGNOSIS — Z903 Acquired absence of stomach [part of]: Secondary | ICD-10-CM | POA: Diagnosis not present

## 2023-04-19 DIAGNOSIS — R892 Abnormal level of other drugs, medicaments and biological substances in specimens from other organs, systems and tissues: Secondary | ICD-10-CM | POA: Diagnosis not present

## 2023-04-19 DIAGNOSIS — M51379 Other intervertebral disc degeneration, lumbosacral region without mention of lumbar back pain or lower extremity pain: Secondary | ICD-10-CM | POA: Diagnosis not present

## 2023-04-19 DIAGNOSIS — Z789 Other specified health status: Secondary | ICD-10-CM | POA: Diagnosis not present

## 2023-04-22 DIAGNOSIS — Z903 Acquired absence of stomach [part of]: Secondary | ICD-10-CM | POA: Diagnosis not present

## 2023-04-22 DIAGNOSIS — K7402 Hepatic fibrosis, advanced fibrosis: Secondary | ICD-10-CM | POA: Diagnosis not present

## 2023-04-22 DIAGNOSIS — K8020 Calculus of gallbladder without cholecystitis without obstruction: Secondary | ICD-10-CM | POA: Diagnosis not present

## 2023-05-22 DIAGNOSIS — R5383 Other fatigue: Secondary | ICD-10-CM | POA: Diagnosis not present

## 2023-05-22 DIAGNOSIS — Z131 Encounter for screening for diabetes mellitus: Secondary | ICD-10-CM | POA: Diagnosis not present

## 2023-05-22 DIAGNOSIS — E559 Vitamin D deficiency, unspecified: Secondary | ICD-10-CM | POA: Diagnosis not present

## 2023-05-22 DIAGNOSIS — K7402 Hepatic fibrosis, advanced fibrosis: Secondary | ICD-10-CM | POA: Diagnosis not present

## 2023-05-22 DIAGNOSIS — M51379 Other intervertebral disc degeneration, lumbosacral region without mention of lumbar back pain or lower extremity pain: Secondary | ICD-10-CM | POA: Diagnosis not present

## 2023-05-25 DIAGNOSIS — G4733 Obstructive sleep apnea (adult) (pediatric): Secondary | ICD-10-CM | POA: Diagnosis not present

## 2023-06-24 DIAGNOSIS — Z789 Other specified health status: Secondary | ICD-10-CM | POA: Diagnosis not present

## 2023-06-24 DIAGNOSIS — M51379 Other intervertebral disc degeneration, lumbosacral region without mention of lumbar back pain or lower extremity pain: Secondary | ICD-10-CM | POA: Diagnosis not present

## 2023-06-24 DIAGNOSIS — M549 Dorsalgia, unspecified: Secondary | ICD-10-CM | POA: Diagnosis not present

## 2023-06-24 DIAGNOSIS — E559 Vitamin D deficiency, unspecified: Secondary | ICD-10-CM | POA: Diagnosis not present

## 2023-06-24 DIAGNOSIS — K7402 Hepatic fibrosis, advanced fibrosis: Secondary | ICD-10-CM | POA: Diagnosis not present

## 2023-06-24 DIAGNOSIS — Z903 Acquired absence of stomach [part of]: Secondary | ICD-10-CM | POA: Diagnosis not present

## 2023-06-24 DIAGNOSIS — R7989 Other specified abnormal findings of blood chemistry: Secondary | ICD-10-CM | POA: Diagnosis not present

## 2023-07-23 DIAGNOSIS — M51379 Other intervertebral disc degeneration, lumbosacral region without mention of lumbar back pain or lower extremity pain: Secondary | ICD-10-CM | POA: Diagnosis not present

## 2023-07-23 DIAGNOSIS — R7989 Other specified abnormal findings of blood chemistry: Secondary | ICD-10-CM | POA: Diagnosis not present

## 2023-07-23 DIAGNOSIS — K7402 Hepatic fibrosis, advanced fibrosis: Secondary | ICD-10-CM | POA: Diagnosis not present

## 2023-07-23 DIAGNOSIS — E559 Vitamin D deficiency, unspecified: Secondary | ICD-10-CM | POA: Diagnosis not present

## 2023-07-23 DIAGNOSIS — W57XXXA Bitten or stung by nonvenomous insect and other nonvenomous arthropods, initial encounter: Secondary | ICD-10-CM | POA: Diagnosis not present

## 2023-07-23 DIAGNOSIS — M549 Dorsalgia, unspecified: Secondary | ICD-10-CM | POA: Diagnosis not present

## 2023-07-23 DIAGNOSIS — Z0000 Encounter for general adult medical examination without abnormal findings: Secondary | ICD-10-CM | POA: Diagnosis not present

## 2023-07-23 DIAGNOSIS — Z789 Other specified health status: Secondary | ICD-10-CM | POA: Diagnosis not present

## 2023-07-23 DIAGNOSIS — Z903 Acquired absence of stomach [part of]: Secondary | ICD-10-CM | POA: Diagnosis not present

## 2023-07-27 ENCOUNTER — Other Ambulatory Visit: Payer: Self-pay

## 2023-07-27 ENCOUNTER — Telehealth: Payer: Self-pay | Admitting: Family Medicine

## 2023-07-27 DIAGNOSIS — E039 Hypothyroidism, unspecified: Secondary | ICD-10-CM

## 2023-07-27 NOTE — Telephone Encounter (Signed)
 [facility] pharmacy is requesting refill  levothyroxine  (SYNTHROID ) 50 MCG tablet  Please advise

## 2023-07-31 ENCOUNTER — Emergency Department
Admission: EM | Admit: 2023-07-31 | Discharge: 2023-07-31 | Disposition: A | Discharge status: Home / Self Care | Attending: Emergency Medicine | Admitting: Emergency Medicine

## 2023-07-31 ENCOUNTER — Other Ambulatory Visit: Payer: Self-pay

## 2023-07-31 DIAGNOSIS — T7840XA Allergy, unspecified, initial encounter: Secondary | ICD-10-CM | POA: Diagnosis not present

## 2023-07-31 DIAGNOSIS — E039 Hypothyroidism, unspecified: Secondary | ICD-10-CM | POA: Diagnosis not present

## 2023-07-31 DIAGNOSIS — S80861S Insect bite (nonvenomous), right lower leg, sequela: Secondary | ICD-10-CM | POA: Insufficient documentation

## 2023-07-31 DIAGNOSIS — S80862S Insect bite (nonvenomous), left lower leg, sequela: Secondary | ICD-10-CM | POA: Insufficient documentation

## 2023-07-31 DIAGNOSIS — R9431 Abnormal electrocardiogram [ECG] [EKG]: Secondary | ICD-10-CM | POA: Diagnosis not present

## 2023-07-31 DIAGNOSIS — W57XXXS Bitten or stung by nonvenomous insect and other nonvenomous arthropods, sequela: Secondary | ICD-10-CM | POA: Diagnosis not present

## 2023-07-31 DIAGNOSIS — S8992XS Unspecified injury of left lower leg, sequela: Secondary | ICD-10-CM | POA: Diagnosis present

## 2023-07-31 DIAGNOSIS — R55 Syncope and collapse: Secondary | ICD-10-CM | POA: Insufficient documentation

## 2023-07-31 MED ORDER — EPINEPHRINE 0.3 MG/0.3ML IJ SOAJ
0.3000 mg | INTRAMUSCULAR | 0 refills | Status: AC | PRN
Start: 2023-07-31 — End: ?

## 2023-07-31 MED ORDER — FAMOTIDINE 20 MG PO TABS
20.0000 mg | ORAL_TABLET | Freq: Once | ORAL | Status: AC
  Administered 2023-07-31: 20 mg via ORAL
  Filled 2023-07-31: qty 1

## 2023-07-31 MED ORDER — FAMOTIDINE 20 MG PO TABS: 20.0000 mg | ORAL_TABLET | Freq: Two times a day (BID) | ORAL | 0 refills | Status: AC

## 2023-07-31 MED ORDER — DOXYCYCLINE HYCLATE 100 MG PO TABS: 100.0000 mg | ORAL_TABLET | Freq: Two times a day (BID) | ORAL | 0 refills | Status: AC

## 2023-07-31 MED ORDER — DIPHENHYDRAMINE HCL 50 MG PO TABS: 25.0000 mg | ORAL_TABLET | Freq: Three times a day (TID) | ORAL | 0 refills | Status: AC | PRN

## 2023-07-31 NOTE — ED Triage Notes (Addendum)
 Family states that after being stung, patient felt dizzy, started to passout. Neighbors assisted him to the ground and Benadryl  given.  Hives to body.  Patient is AAOx3. Skin warm and dry. Hives seen to body. No respiratory distress.  Respirations regular and non labored. Voice clear and strong.

## 2023-07-31 NOTE — ED Notes (Signed)
 Patient declined discharge vital signs. Patient ambulated from department with a steady gait. No acute distress.

## 2023-07-31 NOTE — Discharge Instructions (Addendum)
 You have been seen in the Emergency Department (ED) today for an allergic reaction.  You have been stable throughout your stay in the Emergency Department.  Please take your medications as prescribed and follow up with your doctor as indicated.  Please do NOT take the hydroxyzine  while taking the Benadryl .   Please keep your Epi-Pen with you at all times and use it if experience shortness of breath or difficulty breathing or if you believe you are having a severe allergic reaction.  If you use the Epi-Pen, though, please call 911 afterwards or go immediately to your nearest Emergency Department.  Return to the Emergency Department (ED) if you experience any worsening or new symptoms that concern you.

## 2023-07-31 NOTE — ED Triage Notes (Signed)
 Stung by bees or wasps at around 1200.  Benadryl  po taken.  Patient with hives from [facility] for ED evaluation due to hypotension.

## 2023-07-31 NOTE — ED Provider Notes (Signed)
 [facility] Provider Note    Event Date/Time   First MD Initiated Contact with Patient 07/31/23 1452     (approximate)   History   No chief complaint on file.   HPI  [name] [name] is a 63 y.o. male presenting to the emergency department following 7-8 bee stings to his right lower leg around 12 PM while outside.  He reports hives all over his body at this time with pruritus. Patient also reports a syncopal episode with dizziness following the encounter.  He reports that a neighbor helped him inside and gave him 1 dose of Benadryl .  Patient denies shortness of breath, dyspnea, chest pain, nausea, vomiting, diarrhea, difficulty swallowing or pain with swallowing.  States he was sent here from urgent care following the incident for an episode of hypotension, patient does not know blood pressure values.  States he has not had these episodes of low blood pressure before.  Patient also endorses several ticks on his lower legs that he states has been present for roughly 1 week.  He was seen by his pain doctor last week and given 5 days of doxycycline  for prophylaxis of RMSF and Lyme disease. Past medical history includes MDD, hypothyroidism, history of hepatitis C, prediabetes, intraventricular conduction delay from sleep apnea, fibromyalgia.   Wife is present in the room.   Physical Exam   Triage Vital Signs: ED Triage Vitals [07/31/23 1426]  Encounter Vitals Group     BP 114/70     Girls Systolic BP Percentile      Girls Diastolic BP Percentile      Boys Systolic BP Percentile      Boys Diastolic BP Percentile      Pulse Rate 72     Resp 16     Temp 97.8 F (36.6 C)     Temp Source Oral     SpO2 99 %     Weight (!) 334 lb 10.5 oz (151.8 kg)     Height      Head Circumference      Peak Flow      Pain Score 0     Pain Loc      Pain Education      Exclude from Growth Chart     Most recent vital signs: Vitals:   07/31/23 1426  BP: 114/70  Pulse: 72   Resp: 16  Temp: 97.8 F (36.6 C)  SpO2: 99%    General: Well-appearing, in no acute distress. Appears stated age. Head: Normocephalic, atraumatic. Eyes: PERRLA. No scleral icterus or conjunctival injection. Throat/Mouth: Oropharynx moist, no erythema or exudate. Dentition intact. Neck: Supple. CV: Regular rate, 72 bpm. Dorsalis pedis pulses 2+ and symmetric. No edema. Respiratory: Breath sounds clear b/l. No wheezes, rales, or rhonchi, no stridor. No respiratory distress. Normal respiratory effort. GI: Soft, non-distended, non-tender. No rebound or guarding. MSK: Grossly normal ROM in bilateral upper and lower extremities. No deformities or swelling.  Skin:Warm, dry, intact. No cyanosis or pallor.  Widespread erythematous maculopapular rash on back, bilateral lower extremities with stinger present to right calf.  3 ticks present on lower legs, 1 on lower back. Neurological: A&Ox4 to person, place, time, and situation.  Psychiatric: Mood and affect appropriate. Thought processes coherent.   ED Results / Procedures / Treatments   Labs (all labs ordered are listed, but only abnormal results are displayed) Labs Reviewed - No data to display   EKG  Rate: 77 bpm Rhythm: Regular Axis: Left deviation  P Waves: present before every QRS PR Interval: 156 ms QRS Complex: 94 ms ST Segment: Isometric QT interval: 378 ms T Waves: upright, symmetric No evidence of LVH, STEMI, LBBB, or RBBB.   RADIOLOGY   PROCEDURES:  Critical Care performed: No  Procedures   MEDICATIONS ORDERED IN ED: Medications  famotidine  (PEPCID ) tablet 20 mg (20 mg Oral Given 07/31/23 1515)     IMPRESSION / MDM / ASSESSMENT AND PLAN / ED COURSE  I reviewed the triage vital signs and the nursing notes.                              Differential diagnosis includes, but is not limited to, allergic reaction with urticaria, bee sting, tick bite, Lyme disease, RMSF, hypotension, anaphylaxis  Patient's  presentation is most consistent with acute, uncomplicated illness.  Patient is a 63 year old male presenting today from urgent care following allergic reaction from several bee stings and hypotension.  I reviewed office note from [facility] today prior to ER visit.  Patient already took 1 Benadryl  at 12 PM at home.  Patient was given Pepcid  20 mg in the emergency department.  Vital signs show normal BP of 114/70.  No following episodes of syncope or dizziness after the initial incident.  He is without symptoms that would make me concerned for anaphylaxis or severe allergic reaction.  We discussed going home with Benadryl  and Pepcid  that he can take as needed for pruritus and the urticaria.  I also prescribed him 2 EpiPen  for home and discussed how to use them as well as requirement to go to the emergency department for monitoring following use of them.  I did remove 3 ticks off of him during this visit as well as 1 bee stinger.  Will give him doxycycline  for an additional 7 days for prophylaxis as unsure if these are new ticks or have been present since the other ticks were found a week ago.  Emergency department return precautions were discussed with the patient.  Patient is in agreement to the treatment plan.  Patient is stable for discharge.   FINAL CLINICAL IMPRESSION(S) / ED DIAGNOSES   Final diagnoses:  Allergic reaction, initial encounter  Tick bite of lower leg, left, sequela  Tick bite of lower leg, right, sequela     Rx / DC Orders   ED Discharge Orders          Ordered    famotidine  (PEPCID ) 20 MG tablet  2 times daily        07/31/23 1535    diphenhydrAMINE  (BENADRYL ) 50 MG tablet  Every 8 hours PRN        07/31/23 1535    doxycycline  (VIBRA -TABS) 100 MG tablet  2 times daily        07/31/23 1535    EPINEPHrine  0.3 mg/0.3 mL IJ SOAJ injection  As needed        07/31/23 1535             Note:  This document was prepared using Dragon voice recognition software and may include  unintentional dictation errors.    Thomasenia Flesher, PA-C 07/31/23 1702    Iver Marker, MD 08/05/23 [phone]

## 2023-08-03 ENCOUNTER — Encounter: Payer: Self-pay | Admitting: Family Medicine

## 2023-08-03 ENCOUNTER — Ambulatory Visit: Admitting: Family Medicine

## 2023-08-03 VITALS — BP 112/70 | HR 57 | Resp 16 | Wt 261.9 lb

## 2023-08-03 DIAGNOSIS — K219 Gastro-esophageal reflux disease without esophagitis: Secondary | ICD-10-CM | POA: Diagnosis not present

## 2023-08-03 DIAGNOSIS — E039 Hypothyroidism, unspecified: Secondary | ICD-10-CM | POA: Diagnosis not present

## 2023-08-03 DIAGNOSIS — F410 Panic disorder [episodic paroxysmal anxiety] without agoraphobia: Secondary | ICD-10-CM | POA: Diagnosis not present

## 2023-08-03 DIAGNOSIS — G8929 Other chronic pain: Secondary | ICD-10-CM | POA: Diagnosis not present

## 2023-08-03 DIAGNOSIS — R7303 Prediabetes: Secondary | ICD-10-CM | POA: Diagnosis not present

## 2023-08-03 DIAGNOSIS — Z8619 Personal history of other infectious and parasitic diseases: Secondary | ICD-10-CM

## 2023-08-03 DIAGNOSIS — W57XXXD Bitten or stung by nonvenomous insect and other nonvenomous arthropods, subsequent encounter: Secondary | ICD-10-CM

## 2023-08-03 DIAGNOSIS — Z9884 Bariatric surgery status: Secondary | ICD-10-CM

## 2023-08-03 DIAGNOSIS — Z91030 Bee allergy status: Secondary | ICD-10-CM

## 2023-08-03 DIAGNOSIS — E559 Vitamin D deficiency, unspecified: Secondary | ICD-10-CM

## 2023-08-03 DIAGNOSIS — Z79899 Other long term (current) drug therapy: Secondary | ICD-10-CM | POA: Diagnosis not present

## 2023-08-03 DIAGNOSIS — E785 Hyperlipidemia, unspecified: Secondary | ICD-10-CM

## 2023-08-03 MED ORDER — LEVOTHYROXINE SODIUM 50 MCG PO TABS: 50.0000 ug | ORAL_TABLET | Freq: Every day | ORAL | 3 refills | Status: AC

## 2023-08-03 MED ORDER — OMEPRAZOLE 40 MG PO CPDR: 40.0000 mg | DELAYED_RELEASE_CAPSULE | Freq: Every day | ORAL | 3 refills | Status: AC

## 2023-08-03 MED ORDER — ESCITALOPRAM OXALATE 10 MG PO TABS: 10.0000 mg | ORAL_TABLET | Freq: Every day | ORAL | 1 refills | Status: AC

## 2023-08-03 MED ORDER — ROSUVASTATIN CALCIUM 10 MG PO TABS: 10.0000 mg | ORAL_TABLET | Freq: Every day | ORAL | 3 refills | Status: AC

## 2023-08-03 MED ORDER — BUSPIRONE HCL 7.5 MG PO TABS: 7.5000 mg | ORAL_TABLET | Freq: Three times a day (TID) | ORAL | 1 refills | Status: AC | PRN

## 2023-08-03 NOTE — Progress Notes (Signed)
 Established patient visit   Patient: [name] [name]   DOB: 09/21/1960   63 y.o. Male  MRN: [id number] Visit Date: 08/03/2023  Today's healthcare provider: Carlean Charter, DO   Chief Complaint  Patient presents with   Medication Refill   Subjective    Medication Refill  [name] [name] is a 63 year old male with liver disease who presents for a medication refill and follow-up.  He has been managing multiple medical appointments, including those for pain management and follow-ups related to his bariatric surgery (which he had done December 2024). The surgery went well, but frequent travel for these appointments has been overwhelming.  His bariatric surgeon is through [facility] and his pain management physician is through [facility].  He experienced a significant allergic reaction after being attacked by wasps, resulting in hives and swelling. He now has an EpiPen  for emergencies.  Two weeks ago, he removed nine or ten ticks from his body and was initially treated with a five-day course of doxycycline . Due to ongoing issues, he received an additional course a day or two after finishing the initial.  He is currently taking omeprazole  and has stopped baclofen, hydroxyzine , duloxetine , and oxycodone  IR. He is now on Percocet (5/325 mg) at a dose of two and a half tablets per day, adjusted from two tablets per day due to inadequate pain control. He also has a prescription for Narcan.  He has a history of liver disease and hepatitis C and recently underwent a fibro scan. He takes over-the-counter vitamin D (2000 units) for low vitamin D levels.  He experiences anxiety and has previously used Buspar  effectively. He has tried Cymbalta , Zoloft, and Prozac for mood, but experienced side effects like drowsiness. He has not been taking any mood medications recently and has not seen his psychiatrist in a long time.  He has a history of adverse reactions to certain medications, including  ciprofloxacin , which caused joint swelling. He has tried the following unsuccessfully or with adverse effects: Zoloft (sertraline), duloxetine , abilify , prozac (fluoxetine), citalopram (celexa), paroxetine  (paxil ), trazodone .       Medications: Outpatient Medications Prior to Visit  Medication Sig Note   diphenhydrAMINE  (BENADRYL ) 50 MG tablet Take 0.5 tablets (25 mg total) by mouth every 8 (eight) hours as needed for itching.    doxycycline  (VIBRA -TABS) 100 MG tablet Take 1 tablet (100 mg total) by mouth 2 (two) times daily for 7 days.    EPINEPHrine  0.3 mg/0.3 mL IJ SOAJ injection Inject 0.3 mg into the muscle as needed for anaphylaxis.    famotidine  (PEPCID ) 20 MG tablet Take 1 tablet (20 mg total) by mouth 2 (two) times daily for 7 days.    naloxone (NARCAN) nasal spray 4 mg/0.1 mL SMARTSIG:Both Nares    NON FORMULARY cpap device    oxyCODONE -acetaminophen  (PERCOCET/ROXICET) 5-325 MG tablet Take 2.5 tablets by mouth daily as needed for severe pain (pain score 7-10).    [DISCONTINUED] levothyroxine  (SYNTHROID ) 50 MCG tablet Take 1 tablet (50 mcg total) by mouth daily before breakfast.    [DISCONTINUED] oxyCODONE -acetaminophen  (PERCOCET/ROXICET) 5-325 MG tablet Take 2 tablets by mouth. 2.5 tabs 08/03/2023: by pain management   [DISCONTINUED] rosuvastatin  (CRESTOR ) 10 MG tablet Take 1 tablet (10 mg total) by mouth daily.    [DISCONTINUED] baclofen (LIORESAL) 10 MG tablet Take 10 mg by mouth 3 (three) times daily as needed.    [DISCONTINUED] DULoxetine  (CYMBALTA ) 20 MG capsule TAKE 1 CAPSULE BY MOUTH EVERY DAY. TAKE ALONG WITH 30  MG    [DISCONTINUED] DULoxetine  (CYMBALTA ) 30 MG capsule TAKE 1 CAPSULE BY MOUTH EVERY DAY. TAKE ALONG WITH 20 MG    [DISCONTINUED] hydrOXYzine  (VISTARIL ) 25 MG capsule Take 1 capsule (25 mg total) by mouth 2 (two) times daily as needed for anxiety. Please limit use    [DISCONTINUED] omeprazole  (PRILOSEC) 40 MG capsule TAKE 1 CAPSULE(40 MG) BY MOUTH TWICE DAILY BEFORE  A MEAL    [DISCONTINUED] oxyCODONE  (OXY IR/ROXICODONE ) 5 MG immediate release tablet Take 1 tablet (5 mg total) by mouth every 4 (four) hours as needed for severe pain. 08/03/2023: by pain management   No facility-administered medications prior to visit.        Objective    BP 112/70 (BP Location: Left Arm, Patient Position: Sitting, Cuff Size: Large)   Pulse (!) 57   Resp 16   Wt 261 lb 14.4 oz (118.8 kg)   SpO2 99%   BMI 35.52 kg/m     Physical Exam Vitals and nursing note reviewed.  Constitutional:      General: He is not in acute distress.    Appearance: Normal appearance.  HENT:     Head: Normocephalic and atraumatic.   Eyes:     General: No scleral icterus.    Conjunctiva/sclera: Conjunctivae normal.    Cardiovascular:     Rate and Rhythm: Normal rate.  Pulmonary:     Effort: Pulmonary effort is normal.   Neurological:     Mental Status: He is alert and oriented to person, place, and time. Mental status is at baseline.   Psychiatric:        Mood and Affect: Mood normal.        Behavior: Behavior normal.      Results for orders placed or performed in visit on 08/03/23  Microalbumin / creatinine urine ratio  Result Value Ref Range   Creatinine, Urine 193.5 Not Estab. mg/dL   Microalbumin, Urine 9.5 Not Estab. ug/mL   Microalb/Creat Ratio 5 0 - 29 mg/g creat  Comprehensive metabolic panel with GFR  Result Value Ref Range   Glucose 97 70 - 99 mg/dL   BUN 16 8 - 27 mg/dL   Creatinine, Ser 1.61 (L) 0.76 - 1.27 mg/dL   eGFR 096 >04 VW/UJW/1.19   BUN/Creatinine Ratio 22 10 - 24   Sodium 142 134 - 144 mmol/L   Potassium 4.2 3.5 - 5.2 mmol/L   Chloride 104 96 - 106 mmol/L   CO2 24 20 - 29 mmol/L   Calcium  9.1 8.6 - 10.2 mg/dL   Total Protein 6.8 6.0 - 8.5 g/dL   Albumin 4.3 3.8 - 4.9 g/dL   Globulin, Total 2.5 1.5 - 4.5 g/dL   Bilirubin Total 0.4 0.0 - 1.2 mg/dL   Alkaline Phosphatase 108 44 - 121 IU/L   AST 16 0 - 40 IU/L   ALT 15 0 - 44 IU/L   Hemoglobin A1c  Result Value Ref Range   Hgb A1c MFr Bld 5.2 4.8 - 5.6 %   Est. average glucose Bld gHb Est-mCnc 103 mg/dL  Lipid panel  Result Value Ref Range   Cholesterol, Total 132 100 - 199 mg/dL   Triglycerides 85 0 - 149 mg/dL   HDL 43 >14 mg/dL   VLDL Cholesterol Cal 16 5 - 40 mg/dL   LDL Chol Calc (NIH) 73 0 - 99 mg/dL   Chol/HDL Ratio 3.1 0.0 - 5.0 ratio  VITAMIN D 25 Hydroxy (Vit-D Deficiency, Fractures)  Result Value Ref Range  Vit D, 25-Hydroxy 29.9 (L) 30.0 - 100.0 ng/mL  TSH Rfx on Abnormal to Free T4  Result Value Ref Range   TSH 0.957 0.450 - 4.500 uIU/mL  Vitamin B12  Result Value Ref Range   Vitamin B-12 347 232 - 1,245 pg/mL    Assessment & Plan    Panic disorder without agoraphobia with moderate panic attacks -     busPIRone  HCl; Take 1 tablet (7.5 mg total) by mouth 3 (three) times daily as needed.  Dispense: 270 tablet; Refill: 1 -     Escitalopram Oxalate; Take 1 tablet (10 mg total) by mouth daily.  Dispense: 30 tablet; Refill: 1  Bee sting allergy  Tick bite, unspecified site, subsequent encounter  Other chronic pain  Hypothyroidism, unspecified type -     Levothyroxine  Sodium; Take 1 tablet (50 mcg total) by mouth daily before breakfast.  Dispense: 90 tablet; Refill: 3 -     TSH Rfx on Abnormal to Free T4  Dyslipidemia with elevated low density lipoprotein (LDL) cholesterol and abnormally low high density lipoprotein cholesterol -     Rosuvastatin  Calcium ; Take 1 tablet (10 mg total) by mouth daily.  Dispense: 90 tablet; Refill: 3 -     Microalbumin / creatinine urine ratio -     Comprehensive metabolic panel with GFR -     Lipid panel  History of hepatitis C -     Comprehensive metabolic panel with GFR  Hypovitaminosis D -     VITAMIN D 25 Hydroxy (Vit-D Deficiency, Fractures)  Prediabetes -     Hemoglobin A1c  Gastroesophageal reflux disease without esophagitis -     Vitamin B12 -     Omeprazole ; Take 1 capsule (40 mg total) by  mouth daily.  Dispense: 100 capsule; Refill: 3  High risk medication use -     Vitamin B12  Morbid obesity (HCC) -     Comprehensive metabolic panel with GFR  S/P gastric bypass      Anxiety Anxiety previously managed with Buspar . Current psychiatrist not prescribing Buspar , leading to discontinuation. Drowsiness with Cymbalta  and hydroxyzine . Agreed to try escitalopram and resume Buspar  as needed. - Prescribe escitalopram. - Prescribe Buspar  as needed.  Bee/Wasp Sting Allergy Recent allergic reaction to multiple stings causing hives and swelling. No prior known allergy, likely induced by number of stings. - Ensure EpiPen  availability.  Tick Bites Multiple tick bites treated with initial doxycycline  course. Continued tick presence required second course. - Complete second doxycycline  course.  Chronic Pain Management Chronic pain managed with Percocet 5/325 mg, 2.5 tablets daily. Oxycodone  IR discontinued due to misuse concerns. Regular urine screenings conducted. Narcan available for emergencies. - Continue Percocet 5/325 mg, 2.5 tablets daily.  Defer to specialist management - Ensure Narcan availability.  Hypothyroidism Hypothyroidism managed with levothyroxine . Last thyroid  check in November last year. - Order thyroid  function tests. - Continue levothyroxine .  Dyslipidemia Managed with rosuvastatin . - Continue rosuvastatin . - Counseled on lifestyle modifications. - Continue to follow with bariatric surgery team.  History of Hepatitis C Recent liver panel and fibro scan conducted by pain management. Results not available in current system. - Request recent liver panel and fibro scan results. - Order CMP.  Vitamin D Deficiency Low vitamin D managed with 2000 IU OTC supplements. - Order vitamin D level.  Morbid obesity; status post laparoscopic gastric sleeve bypass Counseled on lifestyle modifications.  Patient has lost 61 pounds since his surgery.  (322 lbs  01/22/2023, now 261 lbs today) - Continue  follow-up with bariatric surgery.  General Health Maintenance Tetanus vaccine due. Shingles vaccine planned post-travel. COVID-19 vaccine discussed, declined due to family history of adverse reactions that he relates to the vaccine. Shingles vaccine recommended. - Encourage tetanus vaccine at pharmacy post-travel. - Encourage shingles vaccine at pharmacy post-travel.     Follow-up Plans for blood work and vaccinations discussed. - Perform comprehensive blood work. - Follow up on blood work results. - Schedule follow-up appointment post-results.  Return in about 6 months (around 02/02/2024) for CPE.      I discussed the assessment and treatment plan with the patient  The patient was provided an opportunity to ask questions and all were answered. The patient agreed with the plan and demonstrated an understanding of the instructions.   The patient was advised to call back or seek an in-person evaluation if the symptoms worsen or if the condition fails to improve as anticipated.    Carlean Charter, DO  [facility] [phone] (phone) [phone] (fax)  [facility]

## 2023-08-03 NOTE — Patient Instructions (Addendum)
 Recommended vaccines: Tdap (tetanus) and Shingrix (shingles)

## 2023-08-04 LAB — COMPREHENSIVE METABOLIC PANEL WITH GFR
ALT: 15 IU/L (ref 0–44)
AST: 16 IU/L (ref 0–40)
Albumin: 4.3 g/dL (ref 3.8–4.9)
Alkaline Phosphatase: 108 IU/L (ref 44–121)
BUN/Creatinine Ratio: 22 (ref 10–24)
BUN: 16 mg/dL (ref 8–27)
Bilirubin Total: 0.4 mg/dL (ref 0.0–1.2)
CO2: 24 mmol/L (ref 20–29)
Calcium: 9.1 mg/dL (ref 8.6–10.2)
Chloride: 104 mmol/L (ref 96–106)
Creatinine, Ser: 0.74 mg/dL — ABNORMAL LOW (ref 0.76–1.27)
Globulin, Total: 2.5 g/dL (ref 1.5–4.5)
Glucose: 97 mg/dL (ref 70–99)
Potassium: 4.2 mmol/L (ref 3.5–5.2)
Sodium: 142 mmol/L (ref 134–144)
Total Protein: 6.8 g/dL (ref 6.0–8.5)
eGFR: 104 mL/min/{1.73_m2} (ref >59)

## 2023-08-04 LAB — TSH RFX ON ABNORMAL TO FREE T4: TSH: 0.957 u[IU]/mL (ref 0.450–4.500)

## 2023-08-04 LAB — HEMOGLOBIN A1C
Est. average glucose Bld gHb Est-mCnc: 103 mg/dL
Hgb A1c MFr Bld: 5.2 % (ref 4.8–5.6)

## 2023-08-04 LAB — MICROALBUMIN / CREATININE URINE RATIO
Creatinine, Urine: 193.5 mg/dL
Microalb/Creat Ratio: 5 mg/g{creat} (ref 0–29)
Microalbumin, Urine: 9.5 ug/mL

## 2023-08-04 LAB — LIPID PANEL
Chol/HDL Ratio: 3.1 ratio (ref 0.0–5.0)
Cholesterol, Total: 132 mg/dL (ref 100–199)
HDL: 43 mg/dL (ref >39)
LDL Chol Calc (NIH): 73 mg/dL (ref 0–99)
Triglycerides: 85 mg/dL (ref 0–149)
VLDL Cholesterol Cal: 16 mg/dL (ref 5–40)

## 2023-08-04 LAB — VITAMIN B12: Vitamin B-12: 347 pg/mL (ref 232–1245)

## 2023-08-04 LAB — VITAMIN D 25 HYDROXY (VIT D DEFICIENCY, FRACTURES): Vit D, 25-Hydroxy: 29.9 ng/mL — ABNORMAL LOW (ref 30.0–100.0)

## 2023-08-06 ENCOUNTER — Ambulatory Visit: Payer: Self-pay | Admitting: Family Medicine

## 2023-08-24 DIAGNOSIS — M51379 Other intervertebral disc degeneration, lumbosacral region without mention of lumbar back pain or lower extremity pain: Secondary | ICD-10-CM | POA: Diagnosis not present

## 2023-08-24 DIAGNOSIS — Z789 Other specified health status: Secondary | ICD-10-CM | POA: Diagnosis not present

## 2023-08-24 DIAGNOSIS — Z1211 Encounter for screening for malignant neoplasm of colon: Secondary | ICD-10-CM | POA: Diagnosis not present

## 2023-08-24 DIAGNOSIS — T63481A Toxic effect of venom of other arthropod, accidental (unintentional), initial encounter: Secondary | ICD-10-CM | POA: Diagnosis not present

## 2023-08-24 DIAGNOSIS — E039 Hypothyroidism, unspecified: Secondary | ICD-10-CM | POA: Diagnosis not present

## 2023-08-24 DIAGNOSIS — E559 Vitamin D deficiency, unspecified: Secondary | ICD-10-CM | POA: Diagnosis not present

## 2023-08-24 DIAGNOSIS — M549 Dorsalgia, unspecified: Secondary | ICD-10-CM | POA: Diagnosis not present

## 2023-08-24 DIAGNOSIS — K7402 Hepatic fibrosis, advanced fibrosis: Secondary | ICD-10-CM | POA: Diagnosis not present

## 2023-08-24 DIAGNOSIS — R7989 Other specified abnormal findings of blood chemistry: Secondary | ICD-10-CM | POA: Diagnosis not present

## 2023-09-02 DIAGNOSIS — G4733 Obstructive sleep apnea (adult) (pediatric): Secondary | ICD-10-CM | POA: Diagnosis not present

## 2023-09-23 DIAGNOSIS — K912 Postsurgical malabsorption, not elsewhere classified: Secondary | ICD-10-CM | POA: Diagnosis not present

## 2023-09-23 DIAGNOSIS — Z8639 Personal history of other endocrine, nutritional and metabolic disease: Secondary | ICD-10-CM | POA: Diagnosis not present

## 2023-09-23 DIAGNOSIS — Z9884 Bariatric surgery status: Secondary | ICD-10-CM | POA: Diagnosis not present

## 2023-09-24 DIAGNOSIS — Z789 Other specified health status: Secondary | ICD-10-CM | POA: Diagnosis not present

## 2023-09-24 DIAGNOSIS — Z1211 Encounter for screening for malignant neoplasm of colon: Secondary | ICD-10-CM | POA: Diagnosis not present

## 2023-09-24 DIAGNOSIS — Z903 Acquired absence of stomach [part of]: Secondary | ICD-10-CM | POA: Diagnosis not present

## 2023-09-24 DIAGNOSIS — M549 Dorsalgia, unspecified: Secondary | ICD-10-CM | POA: Diagnosis not present

## 2023-09-24 DIAGNOSIS — R7989 Other specified abnormal findings of blood chemistry: Secondary | ICD-10-CM | POA: Diagnosis not present

## 2023-09-24 DIAGNOSIS — M51379 Other intervertebral disc degeneration, lumbosacral region without mention of lumbar back pain or lower extremity pain: Secondary | ICD-10-CM | POA: Diagnosis not present

## 2023-09-24 DIAGNOSIS — K7402 Hepatic fibrosis, advanced fibrosis: Secondary | ICD-10-CM | POA: Diagnosis not present

## 2023-09-24 DIAGNOSIS — E559 Vitamin D deficiency, unspecified: Secondary | ICD-10-CM | POA: Diagnosis not present

## 2023-10-13 DIAGNOSIS — Z713 Dietary counseling and surveillance: Secondary | ICD-10-CM | POA: Diagnosis not present

## 2023-10-22 DIAGNOSIS — E559 Vitamin D deficiency, unspecified: Secondary | ICD-10-CM | POA: Diagnosis not present

## 2023-10-22 DIAGNOSIS — Z903 Acquired absence of stomach [part of]: Secondary | ICD-10-CM | POA: Diagnosis not present

## 2023-10-22 DIAGNOSIS — M51379 Other intervertebral disc degeneration, lumbosacral region without mention of lumbar back pain or lower extremity pain: Secondary | ICD-10-CM | POA: Diagnosis not present

## 2023-10-22 DIAGNOSIS — Z789 Other specified health status: Secondary | ICD-10-CM | POA: Diagnosis not present

## 2023-10-22 DIAGNOSIS — M549 Dorsalgia, unspecified: Secondary | ICD-10-CM | POA: Diagnosis not present

## 2023-10-22 DIAGNOSIS — Z1211 Encounter for screening for malignant neoplasm of colon: Secondary | ICD-10-CM | POA: Diagnosis not present

## 2023-10-22 DIAGNOSIS — K7402 Hepatic fibrosis, advanced fibrosis: Secondary | ICD-10-CM | POA: Diagnosis not present

## 2023-10-22 DIAGNOSIS — I4510 Unspecified right bundle-branch block: Secondary | ICD-10-CM | POA: Diagnosis not present

## 2023-12-21 ENCOUNTER — Encounter: Payer: Self-pay | Admitting: Radiology

## 2024-01-01 NOTE — Progress Notes (Signed)
 Pharmacy Quality Measure Review  This patient is appearing on a report for being at risk of failing the adherence measure for cholesterol (statin) medications this calendar year.   Medication: rosuvastatin  Last fill date: 08/03/23 for 90 day supply  Contacted pharmacy to facilitate refills.  Daylene Vandenbosch E. Marsh, PharmD, CPP Clinical Pharmacist [facility] [phone]

## 2024-02-02 ENCOUNTER — Encounter: Admitting: Family Medicine
